# Patient Record
Sex: Male | Born: 1945
Health system: Southern US, Community
[De-identification: ages and names within clinical notes are randomized; demographics above are authoritative.]

## PROBLEM LIST (undated history)

## (undated) DIAGNOSIS — Z789 Other specified health status: Secondary | ICD-10-CM

## (undated) DIAGNOSIS — E119 Type 2 diabetes mellitus without complications: Secondary | ICD-10-CM

## (undated) DIAGNOSIS — I2510 Atherosclerotic heart disease of native coronary artery without angina pectoris: Secondary | ICD-10-CM

## (undated) DIAGNOSIS — IMO0001 Reserved for inherently not codable concepts without codable children: Secondary | ICD-10-CM

## (undated) DIAGNOSIS — I10 Essential (primary) hypertension: Secondary | ICD-10-CM

## (undated) DIAGNOSIS — N289 Disorder of kidney and ureter, unspecified: Secondary | ICD-10-CM

## (undated) DIAGNOSIS — G4730 Sleep apnea, unspecified: Secondary | ICD-10-CM

## (undated) HISTORY — DX: Type 2 diabetes mellitus without complications: E11.9

## (undated) HISTORY — DX: Disorder of kidney and ureter, unspecified: N28.9

## (undated) HISTORY — DX: Other specified health status: Z78.9

## (undated) HISTORY — PX: COLONOSCOPY: SHX174

## (undated) HISTORY — PX: CARDIAC CATHETERIZATION: SHX172

## (undated) HISTORY — DX: Atherosclerotic heart disease of native coronary artery without angina pectoris: I25.10

## (undated) HISTORY — DX: Reserved for inherently not codable concepts without codable children: IMO0001

## (undated) HISTORY — PX: BACK SURGERY: SHX140

---

## 2007-01-29 ENCOUNTER — Encounter: Admission: RE | Admit: 2007-01-29 | Discharge: 2007-01-29 | Payer: Self-pay | Admitting: Nephrology

## 2008-03-18 ENCOUNTER — Encounter: Admission: RE | Admit: 2008-03-18 | Discharge: 2008-03-18 | Payer: Self-pay | Admitting: Nephrology

## 2010-09-27 ENCOUNTER — Ambulatory Visit
Admission: RE | Admit: 2010-09-27 | Discharge: 2010-09-27 | Disposition: A | Discharge status: Home / Self Care | Payer: 59 | Source: Ambulatory Visit | Attending: Nephrology | Admitting: Nephrology

## 2010-09-27 ENCOUNTER — Other Ambulatory Visit: Payer: Self-pay | Admitting: Nephrology

## 2010-09-27 DIAGNOSIS — R52 Pain, unspecified: Secondary | ICD-10-CM

## 2014-06-17 ENCOUNTER — Other Ambulatory Visit: Payer: Self-pay | Admitting: Internal Medicine

## 2014-06-17 DIAGNOSIS — R109 Unspecified abdominal pain: Secondary | ICD-10-CM

## 2014-06-25 ENCOUNTER — Ambulatory Visit (HOSPITAL_COMMUNITY)
Admission: RE | Admit: 2014-06-25 | Discharge: 2014-06-25 | Disposition: A | Discharge status: Home / Self Care | Payer: 59 | Source: Ambulatory Visit | Attending: Diagnostic Radiology | Admitting: Diagnostic Radiology

## 2014-06-25 DIAGNOSIS — N281 Cyst of kidney, acquired: Secondary | ICD-10-CM | POA: Insufficient documentation

## 2014-06-25 DIAGNOSIS — R945 Abnormal results of liver function studies: Secondary | ICD-10-CM | POA: Diagnosis not present

## 2014-06-25 DIAGNOSIS — R109 Unspecified abdominal pain: Secondary | ICD-10-CM | POA: Diagnosis present

## 2014-06-30 ENCOUNTER — Other Ambulatory Visit (HOSPITAL_COMMUNITY): Payer: 59

## 2014-06-30 ENCOUNTER — Ambulatory Visit (HOSPITAL_COMMUNITY): Payer: 59

## 2014-08-13 ENCOUNTER — Ambulatory Visit (HOSPITAL_COMMUNITY)
Admission: RE | Admit: 2014-08-13 | Discharge: 2014-08-13 | Disposition: A | Discharge status: Home / Self Care | Payer: 59 | Source: Ambulatory Visit | Attending: Internal Medicine | Admitting: Internal Medicine

## 2014-08-13 ENCOUNTER — Encounter (INDEPENDENT_AMBULATORY_CARE_PROVIDER_SITE_OTHER): Payer: Self-pay

## 2014-08-13 ENCOUNTER — Other Ambulatory Visit: Payer: Self-pay | Admitting: Internal Medicine

## 2014-08-13 DIAGNOSIS — R110 Nausea: Secondary | ICD-10-CM | POA: Diagnosis not present

## 2014-08-13 DIAGNOSIS — R140 Abdominal distension (gaseous): Secondary | ICD-10-CM | POA: Diagnosis present

## 2014-08-13 DIAGNOSIS — K5641 Fecal impaction: Secondary | ICD-10-CM

## 2014-12-01 ENCOUNTER — Other Ambulatory Visit: Payer: Self-pay | Admitting: Internal Medicine

## 2014-12-01 DIAGNOSIS — M5416 Radiculopathy, lumbar region: Secondary | ICD-10-CM

## 2014-12-03 ENCOUNTER — Ambulatory Visit (HOSPITAL_COMMUNITY): Admission: RE | Admit: 2014-12-03 | Payer: 59 | Source: Ambulatory Visit

## 2014-12-03 ENCOUNTER — Other Ambulatory Visit: Payer: Self-pay | Admitting: Internal Medicine

## 2014-12-03 ENCOUNTER — Ambulatory Visit (HOSPITAL_COMMUNITY)
Admission: RE | Admit: 2014-12-03 | Discharge: 2014-12-03 | Disposition: A | Discharge status: Home / Self Care | Payer: 59 | Source: Ambulatory Visit | Attending: Internal Medicine | Admitting: Internal Medicine

## 2014-12-03 DIAGNOSIS — M5416 Radiculopathy, lumbar region: Secondary | ICD-10-CM | POA: Diagnosis present

## 2014-12-24 ENCOUNTER — Other Ambulatory Visit: Payer: Self-pay | Admitting: Neurosurgery

## 2014-12-29 ENCOUNTER — Encounter (HOSPITAL_COMMUNITY)
Admission: RE | Admit: 2014-12-29 | Discharge: 2014-12-29 | Disposition: A | Discharge status: Home / Self Care | Payer: 59 | Source: Ambulatory Visit | Attending: Neurosurgery | Admitting: Neurosurgery

## 2014-12-29 ENCOUNTER — Encounter (HOSPITAL_COMMUNITY): Payer: Self-pay

## 2014-12-29 ENCOUNTER — Other Ambulatory Visit: Payer: Self-pay

## 2014-12-29 DIAGNOSIS — M5116 Intervertebral disc disorders with radiculopathy, lumbar region: Secondary | ICD-10-CM | POA: Diagnosis not present

## 2014-12-29 DIAGNOSIS — I10 Essential (primary) hypertension: Secondary | ICD-10-CM | POA: Diagnosis not present

## 2014-12-29 DIAGNOSIS — E119 Type 2 diabetes mellitus without complications: Secondary | ICD-10-CM | POA: Diagnosis not present

## 2014-12-29 DIAGNOSIS — M47816 Spondylosis without myelopathy or radiculopathy, lumbar region: Secondary | ICD-10-CM | POA: Diagnosis not present

## 2014-12-29 HISTORY — DX: Essential (primary) hypertension: I10

## 2014-12-29 HISTORY — DX: Type 2 diabetes mellitus without complications: E11.9

## 2014-12-29 LAB — BASIC METABOLIC PANEL
Anion gap: 9 (ref 5–15)
BUN: 9 mg/dL (ref 6–20)
CO2: 26 mmol/L (ref 22–32)
Calcium: 9.6 mg/dL (ref 8.9–10.3)
Chloride: 102 mmol/L (ref 101–111)
Creatinine, Ser: 1.38 mg/dL — ABNORMAL HIGH (ref 0.61–1.24)
GFR calc Af Amer: 60 mL/min — ABNORMAL LOW (ref >60)
GFR calc non Af Amer: 52 mL/min — ABNORMAL LOW (ref >60)
Glucose, Bld: 214 mg/dL — ABNORMAL HIGH (ref 65–99)
Potassium: 4.3 mmol/L (ref 3.5–5.1)
Sodium: 137 mmol/L (ref 135–145)

## 2014-12-29 LAB — CBC
HCT: 45.9 % (ref 39.0–52.0)
Hemoglobin: 14.7 g/dL (ref 13.0–17.0)
MCH: 29.5 pg (ref 26.0–34.0)
MCHC: 32 g/dL (ref 30.0–36.0)
MCV: 92.2 fL (ref 78.0–100.0)
Platelets: 253 10*3/uL (ref 150–400)
RBC: 4.98 MIL/uL (ref 4.22–5.81)
RDW: 12.4 % (ref 11.5–15.5)
WBC: 10.1 10*3/uL (ref 4.0–10.5)

## 2014-12-29 LAB — GLUCOSE, CAPILLARY: Glucose-Capillary: 240 mg/dL — ABNORMAL HIGH (ref 65–99)

## 2014-12-29 LAB — SURGICAL PCR SCREEN
MRSA, PCR: NEGATIVE
Staphylococcus aureus: NEGATIVE

## 2014-12-29 NOTE — Progress Notes (Signed)
This patient scored at an elevated risk for obstructive sleep apnea using the STOP BANG TOOL during a pre surgical testing 

## 2014-12-29 NOTE — Pre-Procedure Instructions (Addendum)
Dvaughn L Ederer  12/29/2014   Your procedure is scheduled on:  Jan 01, 2015  Report to [facility] Admitting at 8:15 AM.  Call this number if you have problems the morning of surgery: [phone]   Remember:   Do not eat food or drink liquids after midnight.   Take these medicines the morning of surgery with A SIP OF WATER: allopurinol (ZYLOPRIM),colchicine,     STOP ASPIRIN, FISH OIL, HERBAL SUPPLEMENTS, VITAMIN E, NSAIDS (NAPROXEN, ADVIL, IBUPROFEN, ALEVE) 7 DAYS PRIOR TO SURGERY   Do not wear jewelry.  Do not wear lotions, powders, or colognes. You may wear deodorant.   Men may shave face and neck.  Do not bring valuables to the hospital.  [facility] is not responsible  for any belongings or valuables.               Contacts, dentures or bridgework may not be worn into surgery.  Leave suitcase in the car. After surgery it may be brought to your room.  For patients admitted to the hospital, discharge time is determined by your  treatment team.               Patients discharged the day of surgery will not be allowed to drive home.  Name and phone number of your driver: family/friend  Special Instructions: "PREPARING FOR SURGERY"   Please read over the following fact sheets that you were given: Pain Booklet, Coughing and Deep Breathing and Surgical Site Infection Prevention

## 2014-12-30 LAB — NO BLOOD PRODUCTS

## 2014-12-30 LAB — HEMOGLOBIN A1C
Hgb A1c MFr Bld: 6.6 % — ABNORMAL HIGH (ref 4.8–5.6)
Mean Plasma Glucose: 143 mg/dL

## 2014-12-30 NOTE — Progress Notes (Signed)
Anesthesia Chart Review:  Pt is 69 year old male scheduled for L5-S1 lumbar laminectomy/decompression microdiscectomy on 01/01/2015 with Dr. Sherwood Gambler.   PMH includes: HTN. DM. Smoking history not documented. BMI 37  Medications include: ASA, enalapril, glimepiride, hctz, janumet.   Preoperative labs reviewed.  Glucose 214, HgbA1c 6.6.   EKG: NSR. Possible Left atrial enlargement. Left axis deviation. Right bundle branch block. No old tracing for comparison.   Reviewed EKG with Dr. Therisa Doyne.   If no changes, I anticipate pt can proceed with surgery as scheduled.   Willeen Cass, FNP-BC [facility]/Anesthesiology Phone: [phone] 12/30/2014 3:39 PM

## 2014-12-31 MED ORDER — CEFAZOLIN SODIUM-DEXTROSE 2-3 GM-% IV SOLR
2.0000 g | INTRAVENOUS | Status: AC
  Administered 2015-01-01: 2 g via INTRAVENOUS
  Filled 2014-12-31: qty 50

## 2015-01-01 ENCOUNTER — Encounter (HOSPITAL_COMMUNITY): Payer: Self-pay | Admitting: General Practice

## 2015-01-01 ENCOUNTER — Encounter (HOSPITAL_COMMUNITY)
Admission: RE | Disposition: A | Discharge status: Home / Self Care | Payer: Self-pay | Source: Ambulatory Visit | Attending: Neurosurgery

## 2015-01-01 ENCOUNTER — Observation Stay (HOSPITAL_COMMUNITY)
Admission: RE | Admit: 2015-01-01 | Discharge: 2015-01-02 | Disposition: A | Discharge status: Home / Self Care | Payer: 59 | Source: Ambulatory Visit | Attending: Neurosurgery | Admitting: Neurosurgery

## 2015-01-01 ENCOUNTER — Ambulatory Visit (HOSPITAL_COMMUNITY): Payer: 59 | Admitting: Anesthesiology

## 2015-01-01 ENCOUNTER — Ambulatory Visit (HOSPITAL_COMMUNITY): Payer: 59 | Admitting: Emergency Medicine

## 2015-01-01 ENCOUNTER — Ambulatory Visit (HOSPITAL_COMMUNITY): Payer: 59

## 2015-01-01 DIAGNOSIS — Z419 Encounter for procedure for purposes other than remedying health state, unspecified: Secondary | ICD-10-CM

## 2015-01-01 DIAGNOSIS — M5116 Intervertebral disc disorders with radiculopathy, lumbar region: Secondary | ICD-10-CM | POA: Diagnosis not present

## 2015-01-01 DIAGNOSIS — M5126 Other intervertebral disc displacement, lumbar region: Secondary | ICD-10-CM | POA: Diagnosis present

## 2015-01-01 DIAGNOSIS — E119 Type 2 diabetes mellitus without complications: Secondary | ICD-10-CM | POA: Insufficient documentation

## 2015-01-01 DIAGNOSIS — I10 Essential (primary) hypertension: Secondary | ICD-10-CM | POA: Insufficient documentation

## 2015-01-01 DIAGNOSIS — M47816 Spondylosis without myelopathy or radiculopathy, lumbar region: Secondary | ICD-10-CM | POA: Insufficient documentation

## 2015-01-01 HISTORY — PX: LUMBAR LAMINECTOMY/DECOMPRESSION MICRODISCECTOMY: SHX5026

## 2015-01-01 LAB — GLUCOSE, CAPILLARY
Glucose-Capillary: 179 mg/dL — ABNORMAL HIGH (ref 65–99)
Glucose-Capillary: 213 mg/dL — ABNORMAL HIGH (ref 65–99)
Glucose-Capillary: 346 mg/dL — ABNORMAL HIGH (ref 65–99)
Glucose-Capillary: 357 mg/dL — ABNORMAL HIGH (ref 65–99)

## 2015-01-01 SURGERY — LUMBAR LAMINECTOMY/DECOMPRESSION MICRODISCECTOMY 1 LEVEL
Anesthesia: General | Laterality: Right

## 2015-01-01 MED ORDER — ARTIFICIAL TEARS OP OINT
TOPICAL_OINTMENT | OPHTHALMIC | Status: DC | PRN
  Administered 2015-01-01: 1 via OPHTHALMIC

## 2015-01-01 MED ORDER — ONDANSETRON HCL 4 MG PO TABS: 4.0000 mg | ORAL_TABLET | Freq: Four times a day (QID) | ORAL | Status: DC | PRN

## 2015-01-01 MED ORDER — SODIUM CHLORIDE 0.9 % IJ SOLN: 3.0000 mL | INTRAMUSCULAR | Status: DC | PRN

## 2015-01-01 MED ORDER — DEXAMETHASONE SODIUM PHOSPHATE 4 MG/ML IJ SOLN
INTRAMUSCULAR | Status: AC
  Filled 2015-01-01: qty 2

## 2015-01-01 MED ORDER — BISACODYL 10 MG RE SUPP
10.0000 mg | Freq: Every day | RECTAL | Status: DC | PRN
Start: 2015-01-01 — End: 2015-01-02

## 2015-01-01 MED ORDER — FENTANYL CITRATE (PF) 250 MCG/5ML IJ SOLN
INTRAMUSCULAR | Status: AC
  Filled 2015-01-01: qty 5

## 2015-01-01 MED ORDER — MORPHINE SULFATE 4 MG/ML IJ SOLN: 4.0000 mg | INTRAMUSCULAR | Status: DC | PRN

## 2015-01-01 MED ORDER — ALUM & MAG HYDROXIDE-SIMETH 200-200-20 MG/5ML PO SUSP: 30.0000 mL | Freq: Four times a day (QID) | ORAL | Status: DC | PRN

## 2015-01-01 MED ORDER — NEOSTIGMINE METHYLSULFATE 10 MG/10ML IV SOLN
INTRAVENOUS | Status: DC | PRN
  Administered 2015-01-01: 3 mg via INTRAVENOUS

## 2015-01-01 MED ORDER — MAGNESIUM HYDROXIDE 400 MG/5ML PO SUSP: 30.0000 mL | Freq: Every day | ORAL | Status: DC | PRN

## 2015-01-01 MED ORDER — METHYLPREDNISOLONE ACETATE 40 MG/ML IJ SUSP
INTRAMUSCULAR | Status: DC | PRN
  Administered 2015-01-01: 80 mg

## 2015-01-01 MED ORDER — SODIUM CHLORIDE 0.9 % IJ SOLN
3.0000 mL | Freq: Two times a day (BID) | INTRAMUSCULAR | Status: DC
  Administered 2015-01-01 (×2): 3 mL via INTRAVENOUS

## 2015-01-01 MED ORDER — ROCURONIUM BROMIDE 50 MG/5ML IV SOLN
INTRAVENOUS | Status: AC
  Filled 2015-01-01: qty 1

## 2015-01-01 MED ORDER — LINAGLIPTIN 5 MG PO TABS
5.0000 mg | ORAL_TABLET | Freq: Every day | ORAL | Status: DC
  Administered 2015-01-02: 5 mg via ORAL
  Filled 2015-01-01 (×2): qty 1

## 2015-01-01 MED ORDER — PHENYLEPHRINE 40 MCG/ML (10ML) SYRINGE FOR IV PUSH (FOR BLOOD PRESSURE SUPPORT)
PREFILLED_SYRINGE | INTRAVENOUS | Status: AC
  Filled 2015-01-01: qty 10

## 2015-01-01 MED ORDER — ACETAMINOPHEN 10 MG/ML IV SOLN
INTRAVENOUS | Status: DC | PRN
  Administered 2015-01-01: 1000 mg via INTRAVENOUS

## 2015-01-01 MED ORDER — BUPIVACAINE HCL (PF) 0.5 % IJ SOLN
INTRAMUSCULAR | Status: DC | PRN
  Administered 2015-01-01: 20 mL

## 2015-01-01 MED ORDER — SODIUM CHLORIDE 0.9 % IR SOLN
Status: DC | PRN
  Administered 2015-01-01: 10:00:00

## 2015-01-01 MED ORDER — LABETALOL HCL 5 MG/ML IV SOLN
INTRAVENOUS | Status: DC | PRN
  Administered 2015-01-01 (×4): 5 mg via INTRAVENOUS

## 2015-01-01 MED ORDER — ROCURONIUM BROMIDE 100 MG/10ML IV SOLN
INTRAVENOUS | Status: DC | PRN
  Administered 2015-01-01: 10 mg via INTRAVENOUS
  Administered 2015-01-01: 40 mg via INTRAVENOUS

## 2015-01-01 MED ORDER — INSULIN ASPART 100 UNIT/ML ~~LOC~~ SOLN
0.0000 [IU] | Freq: Every day | SUBCUTANEOUS | Status: DC
  Administered 2015-01-01: 5 [IU] via SUBCUTANEOUS

## 2015-01-01 MED ORDER — LACTATED RINGERS IV SOLN
INTRAVENOUS | Status: DC | PRN
  Administered 2015-01-01 (×2): via INTRAVENOUS

## 2015-01-01 MED ORDER — ONDANSETRON HCL 4 MG/2ML IJ SOLN
INTRAMUSCULAR | Status: AC
  Filled 2015-01-01: qty 2

## 2015-01-01 MED ORDER — MENTHOL 3 MG MT LOZG: 1.0000 | LOZENGE | OROMUCOSAL | Status: DC | PRN

## 2015-01-01 MED ORDER — DEXAMETHASONE SODIUM PHOSPHATE 4 MG/ML IJ SOLN
INTRAMUSCULAR | Status: DC | PRN
  Administered 2015-01-01: 8 mg via INTRAVENOUS

## 2015-01-01 MED ORDER — METFORMIN HCL 500 MG PO TABS
500.0000 mg | ORAL_TABLET | Freq: Two times a day (BID) | ORAL | Status: DC
  Administered 2015-01-01 – 2015-01-02 (×2): 500 mg via ORAL
  Filled 2015-01-01 (×4): qty 1

## 2015-01-01 MED ORDER — ONDANSETRON HCL 4 MG/2ML IJ SOLN: 4.0000 mg | Freq: Four times a day (QID) | INTRAMUSCULAR | Status: DC | PRN

## 2015-01-01 MED ORDER — HYDROXYZINE HCL 25 MG PO TABS: 50.0000 mg | ORAL_TABLET | ORAL | Status: DC | PRN

## 2015-01-01 MED ORDER — OMEGA-3-ACID ETHYL ESTERS 1 G PO CAPS
1.0000 g | ORAL_CAPSULE | Freq: Three times a day (TID) | ORAL | Status: DC
  Administered 2015-01-01 – 2015-01-02 (×2): 1 g via ORAL
  Filled 2015-01-01 (×4): qty 1

## 2015-01-01 MED ORDER — 0.9 % SODIUM CHLORIDE (POUR BTL) OPTIME
TOPICAL | Status: DC | PRN
  Administered 2015-01-01: 1000 mL

## 2015-01-01 MED ORDER — ACETAMINOPHEN 10 MG/ML IV SOLN
INTRAVENOUS | Status: AC
  Filled 2015-01-01: qty 100

## 2015-01-01 MED ORDER — INSULIN ASPART 100 UNIT/ML ~~LOC~~ SOLN
0.0000 [IU] | Freq: Three times a day (TID) | SUBCUTANEOUS | Status: DC
  Administered 2015-01-02: 7 [IU] via SUBCUTANEOUS

## 2015-01-01 MED ORDER — ENALAPRIL MALEATE 5 MG PO TABS
5.0000 mg | ORAL_TABLET | Freq: Every day | ORAL | Status: DC
  Administered 2015-01-01 – 2015-01-02 (×2): 5 mg via ORAL
  Filled 2015-01-01 (×2): qty 1

## 2015-01-01 MED ORDER — HYDROCODONE-ACETAMINOPHEN 5-325 MG PO TABS
1.0000 | ORAL_TABLET | ORAL | Status: DC | PRN
  Administered 2015-01-01 (×2): 2 via ORAL
  Filled 2015-01-01 (×2): qty 2

## 2015-01-01 MED ORDER — PROPOFOL 10 MG/ML IV BOLUS
INTRAVENOUS | Status: DC | PRN
  Administered 2015-01-01: 160 mg via INTRAVENOUS

## 2015-01-01 MED ORDER — FENTANYL CITRATE (PF) 100 MCG/2ML IJ SOLN
INTRAMUSCULAR | Status: DC | PRN
  Administered 2015-01-01 (×5): 50 ug via INTRAVENOUS
  Administered 2015-01-01: 100 ug via INTRAVENOUS

## 2015-01-01 MED ORDER — GLYCOPYRROLATE 0.2 MG/ML IJ SOLN
INTRAMUSCULAR | Status: DC | PRN
  Administered 2015-01-01: 0.4 mg via INTRAVENOUS

## 2015-01-01 MED ORDER — LIDOCAINE HCL (CARDIAC) 20 MG/ML IV SOLN
INTRAVENOUS | Status: AC
  Filled 2015-01-01: qty 5

## 2015-01-01 MED ORDER — KETOROLAC TROMETHAMINE 15 MG/ML IJ SOLN
INTRAMUSCULAR | Status: AC
  Administered 2015-01-01: 15 mg
  Filled 2015-01-01: qty 1

## 2015-01-01 MED ORDER — ACETAMINOPHEN 650 MG RE SUPP: 650.0000 mg | RECTAL | Status: DC | PRN

## 2015-01-01 MED ORDER — MIDAZOLAM HCL 5 MG/5ML IJ SOLN
INTRAMUSCULAR | Status: DC | PRN
  Administered 2015-01-01: 2 mg via INTRAVENOUS

## 2015-01-01 MED ORDER — KETOROLAC TROMETHAMINE 30 MG/ML IJ SOLN
15.0000 mg | Freq: Four times a day (QID) | INTRAMUSCULAR | Status: DC
  Administered 2015-01-01 (×2): 15 mg via INTRAVENOUS
  Filled 2015-01-01: qty 1

## 2015-01-01 MED ORDER — HYDROCHLOROTHIAZIDE 25 MG PO TABS
25.0000 mg | ORAL_TABLET | Freq: Every day | ORAL | Status: DC
  Administered 2015-01-01 – 2015-01-02 (×2): 25 mg via ORAL
  Filled 2015-01-01 (×2): qty 1

## 2015-01-01 MED ORDER — PROPOFOL 10 MG/ML IV BOLUS
INTRAVENOUS | Status: AC
  Filled 2015-01-01: qty 20

## 2015-01-01 MED ORDER — COLCHICINE 0.6 MG PO TABS
0.6000 mg | ORAL_TABLET | Freq: Every day | ORAL | Status: DC
  Administered 2015-01-01 – 2015-01-02 (×2): 0.6 mg via ORAL
  Filled 2015-01-01 (×2): qty 1

## 2015-01-01 MED ORDER — MEPERIDINE HCL 25 MG/ML IJ SOLN: 6.2500 mg | INTRAMUSCULAR | Status: DC | PRN

## 2015-01-01 MED ORDER — MIDAZOLAM HCL 2 MG/2ML IJ SOLN
INTRAMUSCULAR | Status: AC
  Filled 2015-01-01: qty 2

## 2015-01-01 MED ORDER — ALLOPURINOL 100 MG PO TABS
100.0000 mg | ORAL_TABLET | Freq: Every day | ORAL | Status: DC
  Administered 2015-01-01 – 2015-01-02 (×2): 100 mg via ORAL
  Filled 2015-01-01 (×2): qty 1

## 2015-01-01 MED ORDER — ACETAMINOPHEN 325 MG PO TABS: 650.0000 mg | ORAL_TABLET | ORAL | Status: DC | PRN

## 2015-01-01 MED ORDER — HYDROMORPHONE HCL 1 MG/ML IJ SOLN: 0.2500 mg | INTRAMUSCULAR | Status: DC | PRN

## 2015-01-01 MED ORDER — HYDROXYZINE HCL 50 MG/ML IM SOLN
50.0000 mg | INTRAMUSCULAR | Status: DC | PRN
Start: 2015-01-01 — End: 2015-01-02
  Filled 2015-01-01: qty 1

## 2015-01-01 MED ORDER — LIDOCAINE-EPINEPHRINE 1 %-1:100000 IJ SOLN
INTRAMUSCULAR | Status: DC | PRN
  Administered 2015-01-01: 20 mL

## 2015-01-01 MED ORDER — KETOROLAC TROMETHAMINE 30 MG/ML IJ SOLN: 15.0000 mg | Freq: Once | INTRAMUSCULAR | Status: DC

## 2015-01-01 MED ORDER — LACTATED RINGERS IV SOLN
INTRAVENOUS | Status: DC
  Administered 2015-01-01: 09:00:00 via INTRAVENOUS

## 2015-01-01 MED ORDER — PHENOL 1.4 % MT LIQD: 1.0000 | OROMUCOSAL | Status: DC | PRN

## 2015-01-01 MED ORDER — SITAGLIPTIN PHOS-METFORMIN HCL 50-1000 MG PO TABS: 1.0000 | ORAL_TABLET | Freq: Two times a day (BID) | ORAL | Status: DC

## 2015-01-01 MED ORDER — POTASSIUM CHLORIDE IN NACL 20-0.9 MEQ/L-% IV SOLN
INTRAVENOUS | Status: DC
  Filled 2015-01-01 (×4): qty 1000

## 2015-01-01 MED ORDER — GLIMEPIRIDE 4 MG PO TABS
4.0000 mg | ORAL_TABLET | Freq: Two times a day (BID) | ORAL | Status: DC
  Administered 2015-01-01 – 2015-01-02 (×2): 4 mg via ORAL
  Filled 2015-01-01 (×4): qty 1

## 2015-01-01 MED ORDER — ONDANSETRON HCL 4 MG/2ML IJ SOLN
INTRAMUSCULAR | Status: DC | PRN
  Administered 2015-01-01: 4 mg via INTRAVENOUS

## 2015-01-01 MED ORDER — OXYCODONE-ACETAMINOPHEN 5-325 MG PO TABS: 1.0000 | ORAL_TABLET | ORAL | Status: DC | PRN

## 2015-01-01 MED ORDER — CYCLOBENZAPRINE HCL 10 MG PO TABS
10.0000 mg | ORAL_TABLET | Freq: Three times a day (TID) | ORAL | Status: DC | PRN
  Administered 2015-01-01: 10 mg via ORAL
  Filled 2015-01-01: qty 1

## 2015-01-01 MED ORDER — LIDOCAINE HCL (CARDIAC) 20 MG/ML IV SOLN
INTRAVENOUS | Status: DC | PRN
  Administered 2015-01-01: 50 mg via INTRAVENOUS

## 2015-01-01 MED ORDER — FENTANYL CITRATE (PF) 100 MCG/2ML IJ SOLN
INTRAMUSCULAR | Status: AC
  Filled 2015-01-01: qty 2

## 2015-01-01 MED ORDER — THROMBIN 5000 UNITS EX SOLR
CUTANEOUS | Status: DC | PRN
  Administered 2015-01-01 (×2): 5000 [IU] via TOPICAL

## 2015-01-01 MED ORDER — ACETAMINOPHEN 10 MG/ML IV SOLN: 1000.0000 mg | Freq: Once | INTRAVENOUS | Status: DC | PRN

## 2015-01-01 MED ORDER — PROMETHAZINE HCL 25 MG/ML IJ SOLN: 6.2500 mg | INTRAMUSCULAR | Status: DC | PRN

## 2015-01-01 MED ORDER — KETOROLAC TROMETHAMINE 15 MG/ML IJ SOLN
15.0000 mg | Freq: Four times a day (QID) | INTRAMUSCULAR | Status: DC
  Administered 2015-01-02: 15 mg via INTRAVENOUS
  Filled 2015-01-01 (×5): qty 1

## 2015-01-01 MED ORDER — HEMOSTATIC AGENTS (NO CHARGE) OPTIME
TOPICAL | Status: DC | PRN
  Administered 2015-01-01: 1 via TOPICAL

## 2015-01-01 SURGICAL SUPPLY — 61 items
ADH SKN CLS APL DERMABOND .7 (GAUZE/BANDAGES/DRESSINGS) ×1
APL SKNCLS STERI-STRIP NONHPOA (GAUZE/BANDAGES/DRESSINGS)
BAG DECANTER FOR FLEXI CONT (MISCELLANEOUS) ×2 IMPLANT
BENZOIN TINCTURE PRP APPL 2/3 (GAUZE/BANDAGES/DRESSINGS) IMPLANT
BLADE CLIPPER SURG (BLADE) IMPLANT
BRUSH SCRUB EZ PLAIN DRY (MISCELLANEOUS) ×2 IMPLANT
BUR ACORN 6.0 ACORN (BURR) IMPLANT
BUR ACRON 5.0MM COATED (BURR) IMPLANT
BUR MATCHSTICK NEURO 3.0 LAGG (BURR) ×2 IMPLANT
CANISTER SUCT 3000ML PPV (MISCELLANEOUS) ×2 IMPLANT
CONT SPEC 4OZ CLIKSEAL STRL BL (MISCELLANEOUS) IMPLANT
DERMABOND ADVANCED (GAUZE/BANDAGES/DRESSINGS) ×1
DERMABOND ADVANCED .7 DNX12 (GAUZE/BANDAGES/DRESSINGS) IMPLANT
DRAPE LAPAROTOMY 100X72X124 (DRAPES) ×2 IMPLANT
DRAPE MICROSCOPE LEICA (MISCELLANEOUS) ×2 IMPLANT
DRAPE POUCH INSTRU U-SHP 10X18 (DRAPES) ×2 IMPLANT
DRSG EMULSION OIL 3X3 NADH (GAUZE/BANDAGES/DRESSINGS) IMPLANT
ELECT REM PT RETURN 9FT ADLT (ELECTROSURGICAL) ×2
ELECTRODE REM PT RTRN 9FT ADLT (ELECTROSURGICAL) ×1 IMPLANT
GAUZE SPONGE 4X4 12PLY STRL (GAUZE/BANDAGES/DRESSINGS) IMPLANT
GAUZE SPONGE 4X4 16PLY XRAY LF (GAUZE/BANDAGES/DRESSINGS) IMPLANT
GLOVE BIOGEL PI IND STRL 8 (GLOVE) ×1 IMPLANT
GLOVE BIOGEL PI INDICATOR 8 (GLOVE) ×1
GLOVE ECLIPSE 7.5 STRL STRAW (GLOVE) ×2 IMPLANT
GLOVE EXAM NITRILE LRG STRL (GLOVE) IMPLANT
GLOVE EXAM NITRILE MD LF STRL (GLOVE) IMPLANT
GLOVE EXAM NITRILE XL STR (GLOVE) IMPLANT
GLOVE EXAM NITRILE XS STR PU (GLOVE) IMPLANT
GLOVE INDICATOR 7.5 STRL GRN (GLOVE) ×3 IMPLANT
GLOVE SURG SS PI 7.0 STRL IVOR (GLOVE) ×2 IMPLANT
GOWN STRL REUS W/ TWL LRG LVL3 (GOWN DISPOSABLE) ×1 IMPLANT
GOWN STRL REUS W/ TWL XL LVL3 (GOWN DISPOSABLE) IMPLANT
GOWN STRL REUS W/TWL 2XL LVL3 (GOWN DISPOSABLE) IMPLANT
GOWN STRL REUS W/TWL LRG LVL3 (GOWN DISPOSABLE) ×2
GOWN STRL REUS W/TWL XL LVL3 (GOWN DISPOSABLE) ×4
KIT BASIN OR (CUSTOM PROCEDURE TRAY) ×2 IMPLANT
KIT ROOM TURNOVER OR (KITS) ×2 IMPLANT
NDL HYPO 18GX1.5 BLUNT FILL (NEEDLE) IMPLANT
NDL SPNL 18GX3.5 QUINCKE PK (NEEDLE) ×1 IMPLANT
NDL SPNL 22GX3.5 QUINCKE BK (NEEDLE) ×1 IMPLANT
NEEDLE HYPO 18GX1.5 BLUNT FILL (NEEDLE) IMPLANT
NEEDLE SPNL 18GX3.5 QUINCKE PK (NEEDLE) ×2 IMPLANT
NEEDLE SPNL 22GX3.5 QUINCKE BK (NEEDLE) ×2 IMPLANT
NS IRRIG 1000ML POUR BTL (IV SOLUTION) ×2 IMPLANT
PACK LAMINECTOMY NEURO (CUSTOM PROCEDURE TRAY) ×2 IMPLANT
PAD ARMBOARD 7.5X6 YLW CONV (MISCELLANEOUS) ×6 IMPLANT
PATTIES SURGICAL .5 X1 (DISPOSABLE) ×2 IMPLANT
RUBBERBAND STERILE (MISCELLANEOUS) ×4 IMPLANT
SPONGE LAP 4X18 X RAY DECT (DISPOSABLE) IMPLANT
SPONGE SURGIFOAM ABS GEL SZ50 (HEMOSTASIS) ×2 IMPLANT
STRIP CLOSURE SKIN 1/2X4 (GAUZE/BANDAGES/DRESSINGS) IMPLANT
SUT PROLENE 6 0 BV (SUTURE) IMPLANT
SUT VIC AB 1 CT1 18XBRD ANBCTR (SUTURE) ×1 IMPLANT
SUT VIC AB 1 CT1 8-18 (SUTURE) ×4
SUT VIC AB 2-0 CP2 18 (SUTURE) ×3 IMPLANT
SUT VIC AB 3-0 SH 8-18 (SUTURE) ×1 IMPLANT
SYR 20ML ECCENTRIC (SYRINGE) ×2 IMPLANT
SYR 5ML LL (SYRINGE) ×1 IMPLANT
TOWEL OR 17X24 6PK STRL BLUE (TOWEL DISPOSABLE) ×2 IMPLANT
TOWEL OR 17X26 10 PK STRL BLUE (TOWEL DISPOSABLE) ×2 IMPLANT
WATER STERILE IRR 1000ML POUR (IV SOLUTION) ×2 IMPLANT

## 2015-01-01 NOTE — Plan of Care (Signed)
Problem: Consults Goal: Diagnosis - Spinal Surgery Outcome: Completed/Met Date Met:  01/01/15 Lumbar Laminectomy (Complex)

## 2015-01-01 NOTE — H&P (Signed)
Subjective: Patient is a 69 y.o. right-handed black male who is admitted for treatment of right L5-S1 neural foraminal disc herniation.  Patient has been suffering with low back and right lumbar radicular pain, running through the buttock, thigh, leg, and into the right foot. Symptoms have persisted despite nonsurgical management. Patient is admitted now for a right L5-S1 lumbar laminotomy and microdiscectomy.    Past Medical History  Diagnosis Date  . Diabetes mellitus without complication   . Hypertension     Past Surgical History  Procedure Laterality Date  . Colonoscopy      No prescriptions prior to admission   No Known Allergies  History  Substance Use Topics  . Smoking status: Not on file  . Smokeless tobacco: Not on file  . Alcohol Use: Not on file    Family history: Parents passed on. Mother had gallbladder disease. Father had Brown lung disease.  Review of Systems A comprehensive review of systems was negative.  EXAM: Patient is a well-developed well-nourished black male in no acute distress. Lungs are clear to auscultation , the patient has symmetrical respiratory excursion. Heart has a regular rate and rhythm normal S1 and S2 no murmur.   Abdomen is soft nontender nondistended bowel sounds are present. Extremity examination shows no clubbing cyanosis or edema. Motor examination shows 5 over 5 strength in the lower extremities including the iliopsoas quadriceps dorsiflexor extensor hallicus  longus and plantar flexor bilaterally. Sensation is intact to pinprick in the distal lower extremities. Reflexes are symmetrical bilaterally. No pathologic reflexes are present. Patient has a normal gait and stance.   Data Review:CBC    Component Value Date/Time   WBC 10.1 12/29/2014 1617   RBC 4.98 12/29/2014 1617   HGB 14.7 12/29/2014 1617   HCT 45.9 12/29/2014 1617   PLT 253 12/29/2014 1617   MCV 92.2 12/29/2014 1617   MCH 29.5 12/29/2014 1617   MCHC 32.0 12/29/2014 1617   RDW 12.4 12/29/2014 1617                          BMET    Component Value Date/Time   NA 137 12/29/2014 1617   K 4.3 12/29/2014 1617   CL 102 12/29/2014 1617   CO2 26 12/29/2014 1617   GLUCOSE 214* 12/29/2014 1617   BUN 9 12/29/2014 1617   CREATININE 1.38* 12/29/2014 1617   CALCIUM 9.6 12/29/2014 1617   GFRNONAA 52* 12/29/2014 1617   GFRAA 60* 12/29/2014 1617     Assessment/Plan: Patient with a right lumbar radiculopathy secondary to a right L5-S1 neural foraminal lumbar disc herniation, who is admitted now for right L5-S1 lumbar laminotomy and microdiscectomy.  I've discussed with the patient the nature of his condition, the nature the surgical procedure, the typical length of surgery, hospital stay, and overall recuperation. We discussed limitations postoperatively. I discussed risks of surgery including risks of infection, bleeding, possibly need for transfusion, the risk of nerve root dysfunction with pain, weakness, numbness, or paresthesias, or risk of dural tear and CSF leakage and possible need for further surgery, the risk of recurrent disc herniation and the possible need for further surgery, and the risk of anesthetic complications including myocardial infarction, stroke, pneumonia, and death. Understanding all this the patient does wish to proceed with surgery and is admitted for such.    Hosie Spangle, MD 01/01/2015 7:26 AM

## 2015-01-01 NOTE — Anesthesia Postprocedure Evaluation (Signed)
Anesthesia Post Note  Patient: Juan Hatfield  Procedure(s) Performed: Procedure(s) (LRB): LUMBAR LAMINECTOMY/DECOMPRESSION MICRODISCECTOMY RIGHT LUMBAR FIVE SACRAL ONE (Right)  Anesthesia type: General  Patient location: PACU  Post pain: Pain level controlled  Post assessment: Post-op Vital signs reviewed  Last Vitals: BP 153/68 mmHg  Pulse 61  Temp(Src) 36.4 C (Oral)  Resp 12  Ht 5\' 6"  (1.676 m)  Wt 229 lb (103.874 kg)  BMI 36.98 kg/m2  SpO2 100%  Post vital signs: Reviewed  Level of consciousness: sedated  Complications: No apparent anesthesia complications

## 2015-01-01 NOTE — Progress Notes (Signed)
Filed Vitals:   01/01/15 1240 01/01/15 1255 01/01/15 1327 01/01/15 1623  BP: 144/78 153/68  125/61  Pulse: 68 61  75  Temp:   97.5 F (36.4 C) 98 F (36.7 C)  TempSrc:      Resp: 17 12  18   Height:      Weight:      SpO2: 100% 100%  100%    Patient sitting up, eating dinner. Has been up and ambulating in the halls. Has voided. Wound clean and dry, Dermabond intact. Significant improvement in radicular symptoms.  Plan: Continue to progress through postoperative recovery.  Hosie Spangle, MD 01/01/2015, 6:13 PM

## 2015-01-01 NOTE — Transfer of Care (Signed)
Immediate Anesthesia Transfer of Care Note  Patient: Juan Hatfield  Procedure(s) Performed: Procedure(s): LUMBAR LAMINECTOMY/DECOMPRESSION MICRODISCECTOMY RIGHT LUMBAR FIVE SACRAL ONE (Right)  Patient Location: PACU  Anesthesia Type:General  Level of Consciousness: awake, alert , oriented and patient cooperative  Airway & Oxygen Therapy: Patient Spontanous Breathing and Patient connected to nasal cannula oxygen  Post-op Assessment: Report given to RN and Post -op Vital signs reviewed and stable  Post vital signs: Reviewed and stable  Last Vitals:  Filed Vitals:   01/01/15 0807  BP: 185/86  Pulse: 83  Temp: 36.7 C  Resp: 20    Complications: No apparent anesthesia complications

## 2015-01-01 NOTE — Anesthesia Preprocedure Evaluation (Signed)
Anesthesia Evaluation  Patient identified by MRN, date of birth, ID band Patient awake    Reviewed: Allergy & Precautions, NPO status , Patient's Chart, lab work & pertinent test results  Airway Mallampati: II  TM Distance: >3 FB Neck ROM: Full    Dental no notable dental hx.    Pulmonary neg pulmonary ROS,  breath sounds clear to auscultation  Pulmonary exam normal       Cardiovascular hypertension, Pt. on medications Normal cardiovascular examRhythm:Regular Rate:Normal     Neuro/Psych negative neurological ROS  negative psych ROS   GI/Hepatic negative GI ROS, Neg liver ROS,   Endo/Other  diabetes, Type 2, Oral Hypoglycemic Agents  Renal/GU negative Renal ROS     Musculoskeletal negative musculoskeletal ROS (+)   Abdominal (+) + obese,   Peds  Hematology negative hematology ROS (+)   Anesthesia Other Findings   Reproductive/Obstetrics                             Anesthesia Physical Anesthesia Plan  ASA: II  Anesthesia Plan: General   Post-op Pain Management:    Induction: Intravenous  Airway Management Planned: Oral ETT  Additional Equipment: None  Intra-op Plan:   Post-operative Plan: Extubation in OR  Informed Consent: I have reviewed the patients History and Physical, chart, labs and discussed the procedure including the risks, benefits and alternatives for the proposed anesthesia with the patient or authorized representative who has indicated his/her understanding and acceptance.   Dental advisory given  Plan Discussed with: CRNA  Anesthesia Plan Comments: (Jehovah's witness. Will have cell saver available. Hgb ~ 15 g/dl)        Anesthesia Quick Evaluation

## 2015-01-01 NOTE — Op Note (Signed)
01/01/2015  12:06 PM  PATIENT:  Juan Hatfield  69 y.o. male  PRE-OPERATIVE DIAGNOSIS:  Right L5-S1 foraminal disc herniation, lumbar degenerative disease, lumbar spondylosis, lumbar radiculopathy  POST-OPERATIVE DIAGNOSIS:  Right L5-S1 foraminal disc herniation, lumbar degenerative disease, lumbar spondylosis, lumbar radiculopathy  PROCEDURE:  Procedure(s):  Right L5-S1 lumbar laminotomy and microdiscectomy, with micro-section, microsurgical technique, and the operative microscope  SURGEON:  Surgeon(s): Jovita Gamma, MD Karie Chimera, MD  ASSISTANTS: Karie Chimera, M.D.  ANESTHESIA:   general  EBL:  Total I/O In: 1000 [I.V.:1000] Out: 40 [Blood:40]  BLOOD ADMINISTERED:none  COUNT: Correct per nursing staff  DICTATION: Patient was brought to the operating room and placed under general endotracheal anesthesia. Patient was turned to prone position the lumbar region was prepped with Betadine soap and solution and draped in a sterile fashion. The midline was infiltrated with local anesthetic with epinephrine. A localizing x-ray was taken and the L5-S1 level was identified. Midline incision was made over the L5-S1 level and was carried down through the subcutaneous tissue to the lumbar fascia. The lumbar fascia was incised on the right side and the paraspinal muscles were dissected from the spinous processes and lamina in a subperiosteal fashion. Another x-ray was taken and the L5-S1 intralaminar space was identified. The operating microscope was draped and brought into the field provided additional magnification, illumination, and visualization. Laminotomy was performed using the high-speed drill and Kerrison punches. The ligamentum flavum was carefully resected. The underlying thecal sac and nerve root were identified. The exposure was extended laterally into the right L5-S1 neural foramen. We identified the exiting right L5 nerve root as well as the exiting right S1 nerve root. The disc  herniation was identified and the thecal sac and nerve root gently retracted medially. The disc herniation fragment was partially extruded, and was removed, and then we further open the hole in the annulus, and continued with a thorough discectomy using a variety of pituitary rongeurs and micro-curettes. All loose fragments of disc material removed from both the disc space and the epidural space, as well as the right L5-S1 neural foramen, and good decompression was achieved of the thecal sac, and the exiting right L5 and S1 nerve roots. Once the discectomy was completed and good decompression of the thecal sac and nerve had been achieved hemostasis was established with the use of bipolar cautery and Gelfoam with thrombin. The Gelfoam was removed and hemostasis confirmed. We then instilled 2 cc of fentanyl and 80 mg of Depo-Medrol into the epidural space. Deep fascia was closed with interrupted undyed 1 Vicryl sutures. Scarpa's fascia was closed with interrupted undyed 1 Vicryl sutures in the subcutaneous and subcuticular layer were closed with interrupted inverted 2-0 undyed Vicryl sutures. The skin edges were approximated with Dermabond. Following surgery the patient was turned back to a supine position to be reversed from the anesthetic extubated and transferred to the recovery room for further care.  PLAN OF CARE: Admit for overnight observation  PATIENT DISPOSITION:  PACU - hemodynamically stable.   Delay start of Pharmacological VTE agent (>24hrs) due to surgical blood loss or risk of bleeding:  yes

## 2015-01-01 NOTE — OR Nursing (Signed)
Fentanyl 149mcg/2ml given to operative site by [facility]

## 2015-01-02 DIAGNOSIS — M5116 Intervertebral disc disorders with radiculopathy, lumbar region: Secondary | ICD-10-CM | POA: Diagnosis not present

## 2015-01-02 LAB — GLUCOSE, CAPILLARY: Glucose-Capillary: 247 mg/dL — ABNORMAL HIGH (ref 65–99)

## 2015-01-02 MED ORDER — HYDROCODONE-ACETAMINOPHEN 5-325 MG PO TABS: 1.0000 | ORAL_TABLET | ORAL | Status: DC | PRN

## 2015-01-02 NOTE — Discharge Instructions (Signed)

## 2015-01-02 NOTE — Discharge Summary (Signed)
Physician Discharge Summary  Patient ID: Juan Hatfield MRN: [id number] DOB/AGE: 04-20-1946 69 y.o.  Admit date: 01/01/2015 Discharge date: 01/02/2015  Admission Diagnoses:  Right L5-S1 foraminal disc herniation, lumbar degenerative disease, lumbar spondylosis, lumbar radiculopathy  Discharge Diagnoses:  Right L5-S1 foraminal disc herniation, lumbar degenerative disease, lumbar spondylosis, lumbar radiculopathy Active Problems:   HNP (herniated nucleus pulposus), lumbar   Discharged Condition: good  Hospital Course: Patient was admitted, underwent a right L5-S1 lumbar laminotomy and microsurgical K. He is done well following surgery, with good relief of his radicular symptoms. His incision is healing nicely. He is up and ambulate actively in the halls. He is being discharged home with instructions regarding wound care and activities. He is to return for follow-up with me in 3 weeks.  Discharge Exam: Blood pressure 128/82, pulse 73, temperature 97.9 F (36.6 C), temperature source Oral, resp. rate 20, height 5\' 6"  (1.676 m), weight 103.874 kg (229 lb), SpO2 97 %.  Disposition:  home     Medication List    TAKE these medications        allopurinol 100 MG tablet  Commonly known as:  ZYLOPRIM  Take 100 mg by mouth daily.     aspirin 81 MG tablet  Take 81 mg by mouth daily.     Co Q 10 100 MG Caps  Take 1 tablet by mouth daily.     colchicine 0.6 MG tablet  Take 0.6 mg by mouth daily.     enalapril 5 MG tablet  Commonly known as:  VASOTEC  Take 5 mg by mouth daily.     glimepiride 4 MG tablet  Commonly known as:  AMARYL  Take 4 mg by mouth 2 (two) times daily.     hydrochlorothiazide 25 MG tablet  Commonly known as:  HYDRODIURIL  Take 25 mg by mouth daily.     HYDROcodone-acetaminophen 5-325 MG per tablet  Commonly known as:  NORCO/VICODIN  Take 1-2 tablets by mouth every 4 (four) hours as needed (mild pain).     MULTIVITAMIN ADULT PO  Take 1 tablet by mouth  daily.     SUPER ANTIOXIDANT PO  Take 1 tablet by mouth daily.     naproxen 375 MG tablet  Commonly known as:  NAPROSYN  Take 375 mg by mouth 2 (two) times daily with a meal.     omega-3 acid ethyl esters 1 G capsule  Commonly known as:  LOVAZA  Take 1 g by mouth 3 (three) times daily.     sitaGLIPtin-metformin 50-1000 MG per tablet  Commonly known as:  JANUMET  Take 1 tablet by mouth 2 (two) times daily with a meal.         Signed: Hosie Spangle, MD 01/02/2015, 7:15 AM

## 2015-01-02 NOTE — Progress Notes (Signed)
Patient alert and oriented, mae's well, voiding adequate amount of urine, swallowing without difficulty, no c/o pain. Patient discharged home with family. Script and discharged instructions given to patient. Patient is inform to stop by the MD office to pick up his lumbar brace. Patient and family stated understanding of d/c instructions given and has an appointment with MD.

## 2015-01-06 ENCOUNTER — Encounter (HOSPITAL_COMMUNITY): Payer: Self-pay | Admitting: Neurosurgery

## 2015-10-21 DIAGNOSIS — E119 Type 2 diabetes mellitus without complications: Secondary | ICD-10-CM | POA: Diagnosis not present

## 2015-10-21 DIAGNOSIS — I10 Essential (primary) hypertension: Secondary | ICD-10-CM | POA: Diagnosis not present

## 2015-10-21 DIAGNOSIS — E1165 Type 2 diabetes mellitus with hyperglycemia: Secondary | ICD-10-CM | POA: Diagnosis not present

## 2015-11-30 DIAGNOSIS — I10 Essential (primary) hypertension: Secondary | ICD-10-CM | POA: Diagnosis not present

## 2015-11-30 DIAGNOSIS — E119 Type 2 diabetes mellitus without complications: Secondary | ICD-10-CM | POA: Diagnosis not present

## 2016-01-12 DIAGNOSIS — M545 Low back pain: Secondary | ICD-10-CM | POA: Diagnosis not present

## 2016-01-12 DIAGNOSIS — G8929 Other chronic pain: Secondary | ICD-10-CM | POA: Diagnosis not present

## 2016-01-12 DIAGNOSIS — Z9889 Other specified postprocedural states: Secondary | ICD-10-CM | POA: Diagnosis not present

## 2016-01-12 DIAGNOSIS — M5136 Other intervertebral disc degeneration, lumbar region: Secondary | ICD-10-CM | POA: Diagnosis not present

## 2016-01-12 DIAGNOSIS — M4726 Other spondylosis with radiculopathy, lumbar region: Secondary | ICD-10-CM | POA: Diagnosis not present

## 2016-01-13 DIAGNOSIS — E119 Type 2 diabetes mellitus without complications: Secondary | ICD-10-CM | POA: Diagnosis not present

## 2016-01-13 DIAGNOSIS — M5416 Radiculopathy, lumbar region: Secondary | ICD-10-CM | POA: Diagnosis not present

## 2016-01-13 DIAGNOSIS — M5116 Intervertebral disc disorders with radiculopathy, lumbar region: Secondary | ICD-10-CM | POA: Diagnosis not present

## 2016-01-13 DIAGNOSIS — I10 Essential (primary) hypertension: Secondary | ICD-10-CM | POA: Diagnosis not present

## 2016-04-13 DIAGNOSIS — I10 Essential (primary) hypertension: Secondary | ICD-10-CM | POA: Diagnosis not present

## 2016-04-13 DIAGNOSIS — M5416 Radiculopathy, lumbar region: Secondary | ICD-10-CM | POA: Diagnosis not present

## 2016-04-13 DIAGNOSIS — E1122 Type 2 diabetes mellitus with diabetic chronic kidney disease: Secondary | ICD-10-CM | POA: Diagnosis not present

## 2016-04-13 DIAGNOSIS — M5116 Intervertebral disc disorders with radiculopathy, lumbar region: Secondary | ICD-10-CM | POA: Diagnosis not present

## 2016-04-13 DIAGNOSIS — E119 Type 2 diabetes mellitus without complications: Secondary | ICD-10-CM | POA: Diagnosis not present

## 2016-04-13 DIAGNOSIS — M2550 Pain in unspecified joint: Secondary | ICD-10-CM | POA: Diagnosis not present

## 2016-05-19 IMAGING — MR MR LUMBAR SPINE W/O CM
4 of 5 series · 19 of 48 positions shown · non-contrast
Comparison: None.

CLINICAL DATA: Severe low back pain radiating to the RIGHT hip with
RIGHT leg numbness, symptoms worsening over the past 4 weeks.
Initial encounter.

EXAM:
MRI LUMBAR SPINE WITHOUT CONTRAST
TECHNIQUE: Multiplanar, multisequence MR imaging of the lumbar spine was
performed. No intravenous contrast was administered.

[Series 3: T1 · sagittal · 4.0mm · 0.55mm/px · 3 of 12 slices shown (1 of 2)]
[im 3/12]
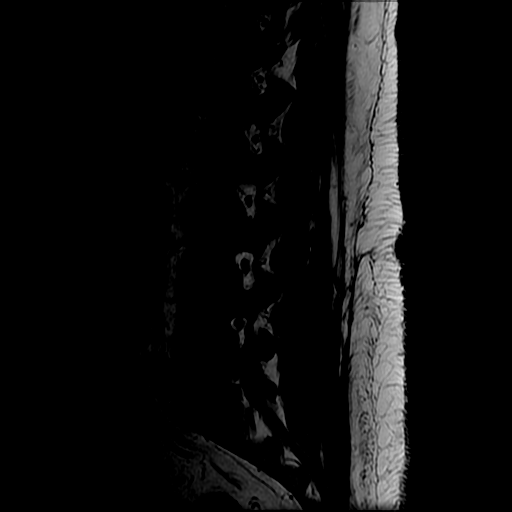
[im 7/12]
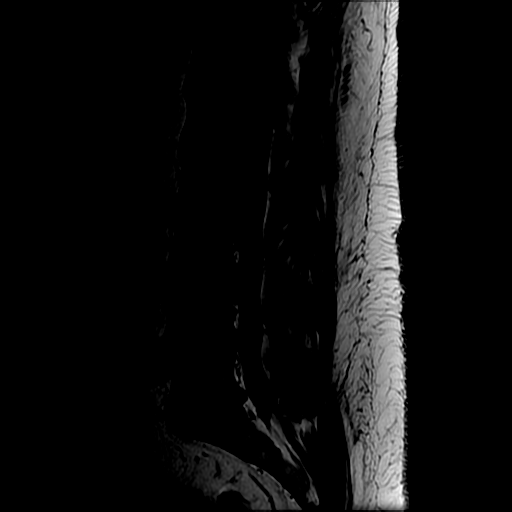
[im 12/12]
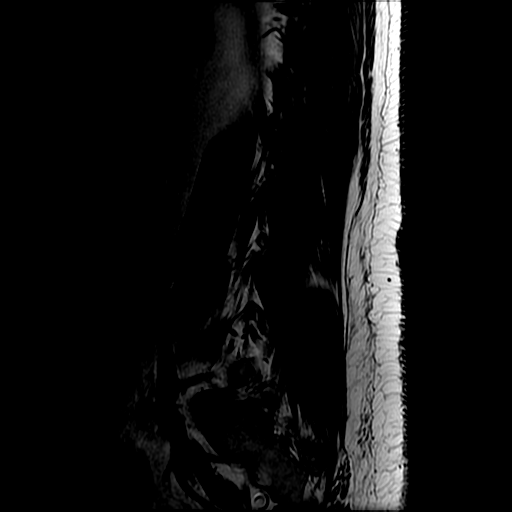

[Series 4: T2 · sagittal · 4.0mm · 0.55mm/px · 5 of 12 slices shown (1 of 2)]
[im 1/12]
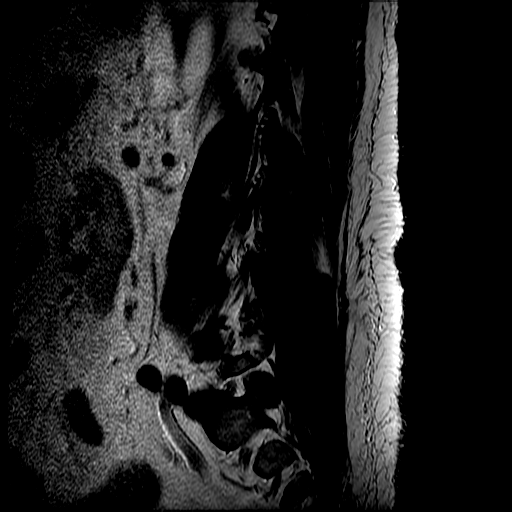
[im 3/12]
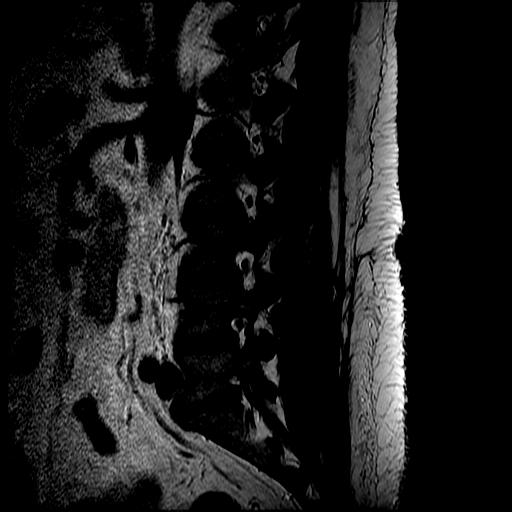
[im 6/12]
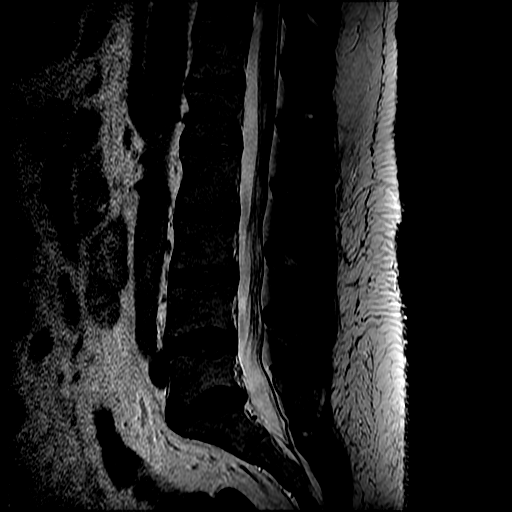
[im 9/12]
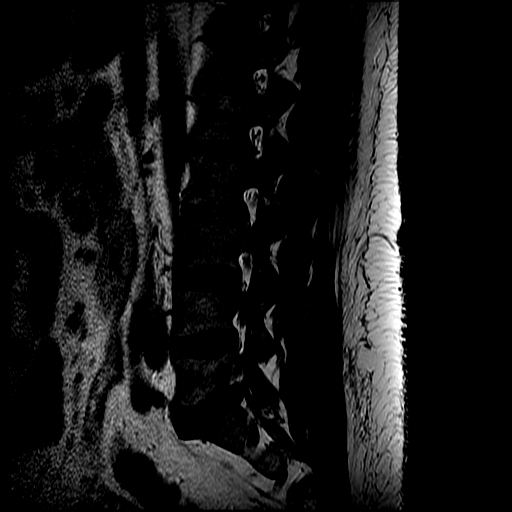
[im 12/12]
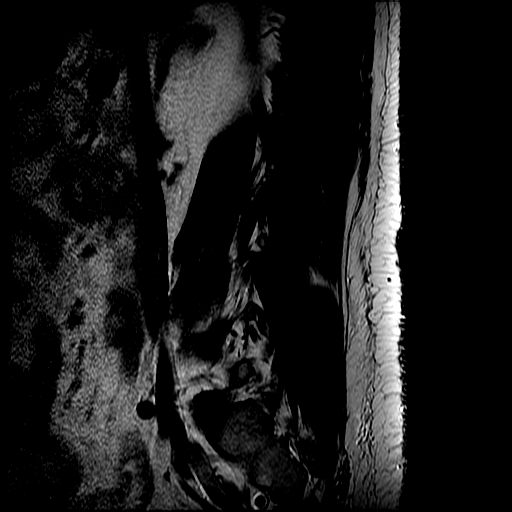

[Series 6: T2 · axial · 4.0mm · 0.39mm/px · z∈[-87,+74]mm · 8 of 35 slices shown (2 of 2)]
[im 3/35]
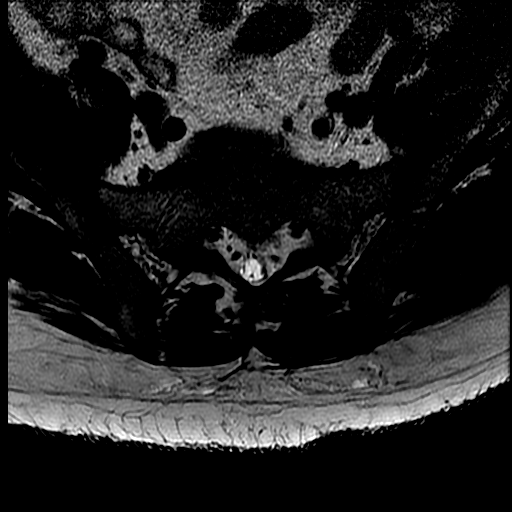
[im 5/35]
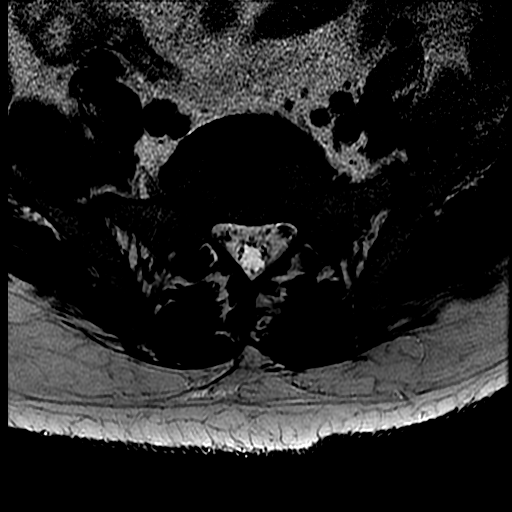
[im 7/35]
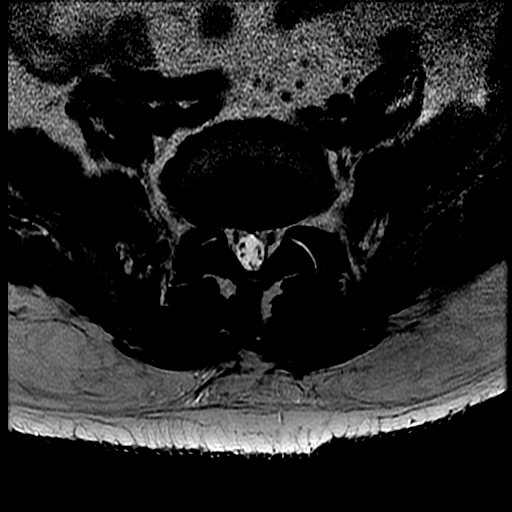
[im 12/35]
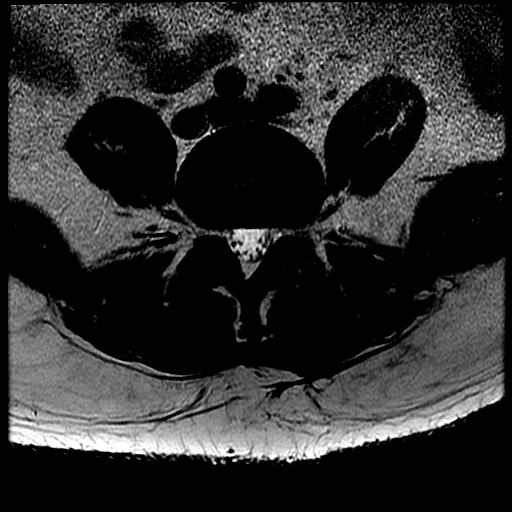
[im 16/35]
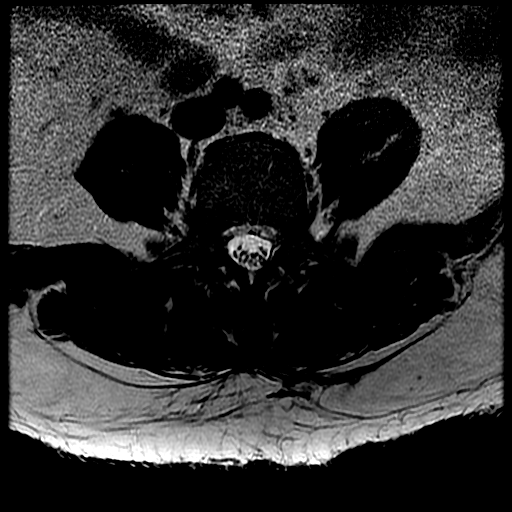
[im 19/35]
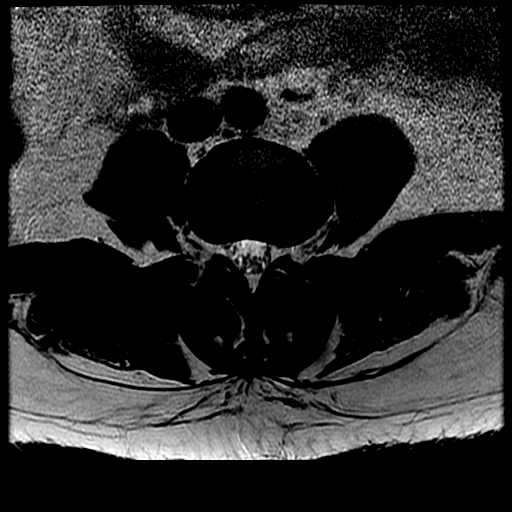
[im 21/35]
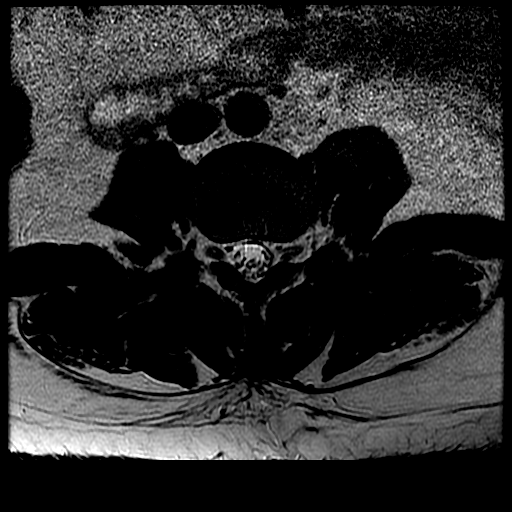
[im 30/35]
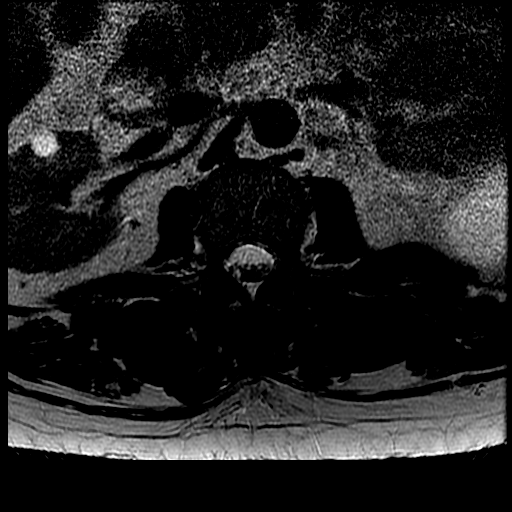

[Series 7: T1 · axial · 4.0mm · 0.39mm/px · z∈[-78,+74]mm · 3 of 35 slices shown (2 of 2)]
[im 5/35]
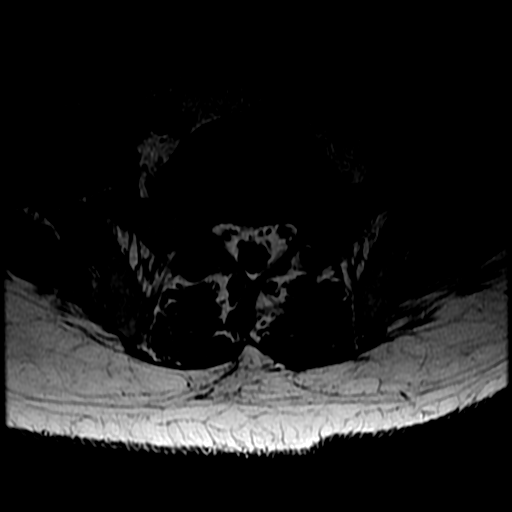
[im 19/35]
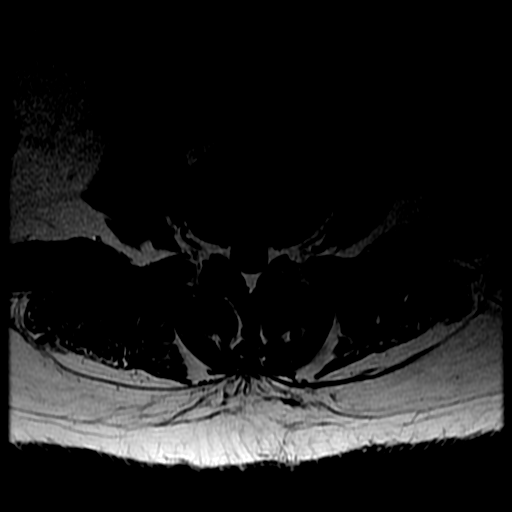
[im 30/35]
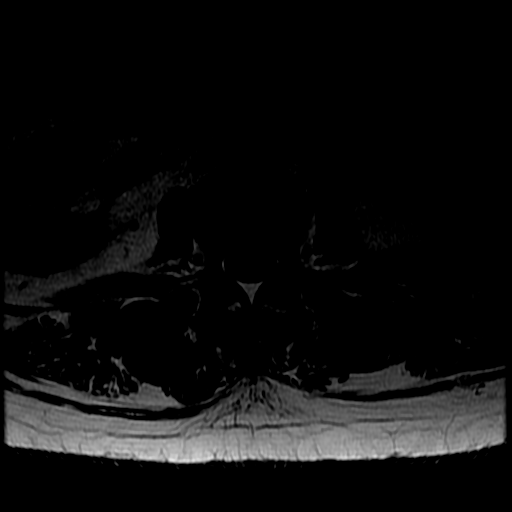

[19 of 48 positions shown; findings below may reference images not displayed]

FINDINGS: Segmentation: Normal segmentation with five lumbar type vertebral
bodies.

Alignment: Mild straightening of the normal cervical lordosis. No
subluxation.

Vertebrae: No worrisome osseous lesion.No pars defects.

Conus medullaris: Normal in size, signal, and location.

Paraspinal tissues: No evidence for hydronephrosis or paravertebral
mass. Renal cystic disease is incompletely evaluated. There is a
large LEFT renal cyst new projecting beyond the field of view;
consider renal sonography for further characterization.

Disc levels:

L1-L2:  Normal.

L2-L3:  Normal.

L3-L4:  Normal.

L4-L5:  Normal.

L5-S1: BILATERAL foraminal and extraforaminal protrusions, much
worse on the RIGHT. Mild facet arthropathy. RIGHT L5 nerve root
impingement is best observed on parasagittal imaging. No
subarticular zone narrowing.
IMPRESSION: Potentially symptomatic foraminal protrusion at L5-S1 on the RIGHT.
Correlate clinically for RIGHT L5 radicular symptoms.

## 2016-07-14 DIAGNOSIS — E119 Type 2 diabetes mellitus without complications: Secondary | ICD-10-CM | POA: Diagnosis not present

## 2016-07-20 DIAGNOSIS — E119 Type 2 diabetes mellitus without complications: Secondary | ICD-10-CM | POA: Diagnosis not present

## 2016-07-20 DIAGNOSIS — I10 Essential (primary) hypertension: Secondary | ICD-10-CM | POA: Diagnosis not present

## 2016-07-20 DIAGNOSIS — M5116 Intervertebral disc disorders with radiculopathy, lumbar region: Secondary | ICD-10-CM | POA: Diagnosis not present

## 2016-07-20 DIAGNOSIS — M5416 Radiculopathy, lumbar region: Secondary | ICD-10-CM | POA: Diagnosis not present

## 2018-10-24 ENCOUNTER — Other Ambulatory Visit: Payer: Self-pay | Admitting: Gastroenterology

## 2018-12-25 ENCOUNTER — Ambulatory Visit (HOSPITAL_COMMUNITY): Admission: RE | Admit: 2018-12-25 | Payer: Medicare Other | Source: Home / Self Care | Admitting: Gastroenterology

## 2018-12-25 ENCOUNTER — Encounter (HOSPITAL_COMMUNITY): Admission: RE | Payer: Self-pay | Source: Home / Self Care

## 2018-12-25 SURGERY — COLONOSCOPY WITH PROPOFOL
Anesthesia: Monitor Anesthesia Care

## 2019-02-22 ENCOUNTER — Other Ambulatory Visit: Payer: Self-pay | Admitting: Gastroenterology

## 2019-03-12 ENCOUNTER — Ambulatory Visit (INDEPENDENT_AMBULATORY_CARE_PROVIDER_SITE_OTHER): Payer: Medicare Other

## 2019-03-12 ENCOUNTER — Other Ambulatory Visit: Payer: Self-pay

## 2019-03-12 ENCOUNTER — Ambulatory Visit (HOSPITAL_COMMUNITY)
Admission: EM | Admit: 2019-03-12 | Discharge: 2019-03-12 | Disposition: A | Discharge status: Home / Self Care | Payer: Medicare Other | Attending: Family Medicine | Admitting: Family Medicine

## 2019-03-12 ENCOUNTER — Encounter (HOSPITAL_COMMUNITY): Payer: Self-pay | Admitting: Emergency Medicine

## 2019-03-12 DIAGNOSIS — I10 Essential (primary) hypertension: Secondary | ICD-10-CM | POA: Diagnosis not present

## 2019-03-12 DIAGNOSIS — R1010 Upper abdominal pain, unspecified: Secondary | ICD-10-CM | POA: Diagnosis present

## 2019-03-12 LAB — COMPREHENSIVE METABOLIC PANEL
ALT: 22 U/L (ref 0–44)
AST: 17 U/L (ref 15–41)
Albumin: 3.9 g/dL (ref 3.5–5.0)
Alkaline Phosphatase: 122 U/L (ref 38–126)
Anion gap: 13 (ref 5–15)
BUN: 17 mg/dL (ref 8–23)
CO2: 25 mmol/L (ref 22–32)
Calcium: 10 mg/dL (ref 8.9–10.3)
Chloride: 96 mmol/L — ABNORMAL LOW (ref 98–111)
Creatinine, Ser: 1.67 mg/dL — ABNORMAL HIGH (ref 0.61–1.24)
GFR calc Af Amer: 47 mL/min — ABNORMAL LOW (ref >60)
GFR calc non Af Amer: 41 mL/min — ABNORMAL LOW (ref >60)
Glucose, Bld: 383 mg/dL — ABNORMAL HIGH (ref 70–99)
Potassium: 5 mmol/L (ref 3.5–5.1)
Sodium: 134 mmol/L — ABNORMAL LOW (ref 135–145)
Total Bilirubin: 0.6 mg/dL (ref 0.3–1.2)
Total Protein: 7.5 g/dL (ref 6.5–8.1)

## 2019-03-12 LAB — CBC
HCT: 50.9 % (ref 39.0–52.0)
Hemoglobin: 15.8 g/dL (ref 13.0–17.0)
MCH: 28.2 pg (ref 26.0–34.0)
MCHC: 31 g/dL (ref 30.0–36.0)
MCV: 90.9 fL (ref 80.0–100.0)
Platelets: 253 10*3/uL (ref 150–400)
RBC: 5.6 MIL/uL (ref 4.22–5.81)
RDW: 11.2 % — ABNORMAL LOW (ref 11.5–15.5)
WBC: 9.3 10*3/uL (ref 4.0–10.5)
nRBC: 0 % (ref 0.0–0.2)

## 2019-03-12 MED ORDER — LIDOCAINE VISCOUS HCL 2 % MT SOLN
15.0000 mL | Freq: Once | OROMUCOSAL | Status: AC
  Administered 2019-03-12: 11:00:00 15 mL via ORAL

## 2019-03-12 MED ORDER — ALUM & MAG HYDROXIDE-SIMETH 200-200-20 MG/5ML PO SUSP
ORAL | Status: AC
  Filled 2019-03-12: qty 30

## 2019-03-12 MED ORDER — ALUM & MAG HYDROXIDE-SIMETH 200-200-20 MG/5ML PO SUSP
30.0000 mL | Freq: Once | ORAL | Status: AC
  Administered 2019-03-12: 30 mL via ORAL

## 2019-03-12 MED ORDER — LIDOCAINE VISCOUS HCL 2 % MT SOLN
OROMUCOSAL | Status: AC
  Filled 2019-03-12: qty 15

## 2019-03-12 NOTE — ED Provider Notes (Signed)
Juan Hatfield    CSN: [id number] Arrival date & time: 03/12/19  5277     History   Chief Complaint Chief Complaint  Patient presents with   Abdominal Cramping    HPI Juan Hatfield is a 73 y.o. male.   Patient reports intermittent abdominal pain over the past 6 months; worse x 2 weeks.  He states it "goes across the top" of his abdomen and then down the right side.  He states it feels like hunger pains and is relieved with food.  He states he feels "gassy" and is belching and passing gas.  He took a laxative yesterday and had good results; last bowel movement today.  He denies chest pain, SOB, fever, chills, nausea, vomiting, diarrhea.  He has not seen his PCP or sought medical care for this pain before today.  He is scheduled for a colonoscopy on 04/02/2019.  The history is provided by the patient.    Past Medical History:  Diagnosis Date   Diabetes mellitus without complication ([place])    Hypertension     Patient Active Problem List   Diagnosis Date Noted   HNP (herniated nucleus pulposus), lumbar 01/01/2015    Past Surgical History:  Procedure Laterality Date   COLONOSCOPY     LUMBAR LAMINECTOMY/DECOMPRESSION MICRODISCECTOMY Right 01/01/2015   Procedure: LUMBAR LAMINECTOMY/DECOMPRESSION MICRODISCECTOMY RIGHT LUMBAR FIVE SACRAL ONE;  Surgeon: Jovita Gamma, MD;  Location: [facility];  Service: Neurosurgery;  Laterality: Right;       Home Medications    Prior to Admission medications   Medication Sig Start Date End Date Taking? Authorizing Provider  allopurinol (ZYLOPRIM) 100 MG tablet Take 100 mg by mouth daily.    [provider]  aspirin 81 MG tablet Take 81 mg by mouth daily.    [provider]  Coenzyme Q10 (CO Q 10) 100 MG CAPS Take 1 tablet by mouth daily.    [provider]  colchicine 0.6 MG tablet Take 0.6 mg by mouth daily.    [provider]  enalapril (VASOTEC) 5 MG tablet Take 5 mg by mouth daily.     [provider]  glimepiride (AMARYL) 4 MG tablet Take 4 mg by mouth 2 (two) times daily.    [provider]  hydrochlorothiazide (HYDRODIURIL) 25 MG tablet Take 25 mg by mouth daily.    [provider]  HYDROcodone-acetaminophen (NORCO/VICODIN) 5-325 MG per tablet Take 1-2 tablets by mouth every 4 (four) hours as needed (mild pain). Patient not taking: Reported on 03/12/2019 01/02/15   Jovita Gamma, MD  Multiple Vitamins-Minerals (MULTIVITAMIN ADULT PO) Take 1 tablet by mouth daily.    [provider]  Multiple Vitamins-Minerals (SUPER ANTIOXIDANT PO) Take 1 tablet by mouth daily.    [provider]  naproxen (NAPROSYN) 375 MG tablet Take 375 mg by mouth 2 (two) times daily with a meal. 11/19/14   [provider]  omega-3 acid ethyl esters (LOVAZA) 1 G capsule Take 1 g by mouth 3 (three) times daily.    [provider]  sitaGLIPtin-metformin (JANUMET) 50-1000 MG per tablet Take 1 tablet by mouth 2 (two) times daily with a meal.    [provider]    Family History No family history on file.  Social History Social History   Tobacco Use   Smoking status: Never Smoker  Substance Use Topics   Alcohol use: Not on file   Drug use: Not on file     Allergies  Other   Review of Systems Review of Systems  Constitutional: Negative for chills and fever.  HENT: Negative for ear pain and sore throat.   Eyes: Negative for pain and visual disturbance.  Respiratory: Negative for cough and shortness of breath.   Cardiovascular: Negative for chest pain and palpitations.  Gastrointestinal: Positive for abdominal pain. Negative for diarrhea and vomiting.  Genitourinary: Negative for dysuria, hematuria and testicular pain.  Musculoskeletal: Negative for arthralgias and back pain.  Skin: Negative for color change and rash.  Neurological: Negative for seizures and syncope.  All other systems reviewed and are  negative.    Physical Exam Triage Vital Signs ED Triage Vitals  Enc Vitals Group     BP 03/12/19 1005 (!) 161/97     Pulse Rate 03/12/19 1005 91     Resp 03/12/19 1005 16     Temp 03/12/19 1005 98.2 F (36.8 C)     Temp Source 03/12/19 1005 Oral     SpO2 03/12/19 1005 97 %     Weight --      Height --      Head Circumference --      Peak Flow --      Pain Score 03/12/19 1008 6     Pain Loc --      Pain Edu? --      Excl. in [place]? --    No data found.  Updated Vital Signs BP (!) 161/97 (BP Location: Left Arm)    Pulse 91    Temp 98.2 F (36.8 C) (Oral)    Resp 16    SpO2 97%   Visual Acuity Right Eye Distance:   Left Eye Distance:   Bilateral Distance:    Right Eye Near:   Left Eye Near:    Bilateral Near:     Physical Exam Vitals signs and nursing note reviewed.  Constitutional:      Appearance: He is well-developed.  HENT:     Head: Normocephalic and atraumatic.     Mouth/Throat:     Mouth: Mucous membranes are moist.     Pharynx: Oropharynx is clear.  Eyes:     Conjunctiva/sclera: Conjunctivae normal.  Neck:     Musculoskeletal: Neck supple.  Cardiovascular:     Rate and Rhythm: Normal rate and regular rhythm.     Heart sounds: Normal heart sounds.  Pulmonary:     Effort: Pulmonary effort is normal. No respiratory distress.     Breath sounds: Normal breath sounds.  Abdominal:     General: Bowel sounds are normal.     Palpations: Abdomen is soft.     Tenderness: There is no abdominal tenderness. There is no right CVA tenderness, left CVA tenderness, guarding or rebound.  Skin:    General: Skin is warm and dry.  Neurological:     Mental Status: He is alert.      UC Treatments / Results  Labs (all labs ordered are listed, but only abnormal results are displayed) Labs Reviewed  CBC - Abnormal; Notable for the following components:      Result Value   RDW 11.2 (*)    All other components within normal limits  COMPREHENSIVE METABOLIC PANEL -  Abnormal; Notable for the following components:   Sodium 134 (*)    Chloride 96 (*)    Glucose, Bld 383 (*)    Creatinine, Ser 1.67 (*)    GFR calc non Af Amer 41 (*)    GFR calc Af Wyvonnia Lora  47 (*)    All other components within normal limits    EKG   Radiology Dg Abdomen 1 View  Result Date: 03/12/2019 CLINICAL DATA:  Abdominal pain for 3-4 weeks. EXAM: ABDOMEN - 1 VIEW COMPARISON:  08/13/2014 FINDINGS: There is scattered air and stool in the colon and scattered air throughout the small bowel but no distension. No free air. The soft tissue shadows are maintained. The bony structures are unremarkable. IMPRESSION: No plain film findings for an acute abdominal process. Electronically Signed   By: Marijo Sanes M.D.   On: 03/12/2019 11:23    Procedures Procedures (including critical care time)  Medications Ordered in UC Medications  alum & mag hydroxide-simeth (MAALOX/MYLANTA) 200-200-20 MG/5ML suspension 30 mL (30 mLs Oral Given 03/12/19 1040)    And  lidocaine (XYLOCAINE) 2 % viscous mouth solution 15 mL (15 mLs Oral Given 03/12/19 1041)  alum & mag hydroxide-simeth (MAALOX/MYLANTA) 200-200-20 MG/5ML suspension (has no administration in time range)  lidocaine (XYLOCAINE) 2 % viscous mouth solution (has no administration in time range)    Initial Impression / Assessment and Plan / UC Course  I have reviewed the triage vital signs and the nursing notes.  Pertinent labs & imaging results that were available during my care of the patient were reviewed by me and considered in my medical decision making (see chart for details).   Abdominal pain.  Elevated blood pressure with known hypertension.  Exam unremarkable.  Pain resolved with Maalox/Mylanta/lidocaine given.  No acute concerns with lab work or EKG.  EKG/labs reviewed, case discussed, and treatment plan reviewed with Dr. Mannie Stabile.  Instructed patient to follow-up with his PCP this week; he states he has an appointment on Thursday.   Discussed with patient that he should review his blood test results with his PCP as he is blood sugar was elevated; this blood test was postprandial.  Discussed with patient that his pain was relieved with the Maalox and Mylanta and that he can try these at home as they are over-the-counter products.  Instructed patient to go to the emergency department if he develops acute abdominal pain, chest pain, shortness of breath, fever, vomiting, diarrhea, or other concerning symptoms.  Reviewed blood pressure with patient and instructed him to follow-up with his PCP for a recheck.     Final Clinical Impressions(s) / UC Diagnoses   Final diagnoses:  Pain of upper abdomen  Elevated blood pressure reading in office with diagnosis of hypertension     Discharge Instructions     Follow-up with your primary care provider this week.    Your blood test do not show any acute concerns but please review the results with your primary care provider when you see them this week.    The medicine that we gave you here today appears to have helped your pain.  This medicine was a combination of Maalox and Mylanta.    Go to the emergency department if your abdominal pain persists or worsens; or if you develop chest pain, shortness of breath, fever, vomiting, diarrhea, or other concerning symptoms.   Your blood pressure was 161/97 today.  Please follow up with your primary care provider to have this rechecked in the next 2-4 weeks.       ED Prescriptions    None     Controlled Substance Prescriptions [place] Controlled Substance Registry consulted? Not Applicable   Sharion Balloon, NP 03/12/19 1212

## 2019-03-12 NOTE — Discharge Instructions (Addendum)
Follow-up with your primary care provider this week.    Your blood test do not show any acute concerns but please review the results with your primary care provider when you see them this week.    The medicine that we gave you here today appears to have helped your pain.  This medicine was a combination of Maalox and Mylanta.    Go to the emergency department if your abdominal pain persists or worsens; or if you develop chest pain, shortness of breath, fever, vomiting, diarrhea, or other concerning symptoms.   Your blood pressure was 161/97 today.  Please follow up with your primary care provider to have this rechecked in the next 2-4 weeks.

## 2019-03-12 NOTE — ED Triage Notes (Signed)
Pt states for the last three weeks hes had "hunger pains" in his upper abdomen traveling downt he R side, pt denies pain with palpation. Denies n/v/d.

## 2019-03-19 ENCOUNTER — Other Ambulatory Visit: Payer: Self-pay | Admitting: Internal Medicine

## 2019-03-19 DIAGNOSIS — R1310 Dysphagia, unspecified: Secondary | ICD-10-CM

## 2019-03-19 DIAGNOSIS — R1319 Other dysphagia: Secondary | ICD-10-CM

## 2019-03-22 ENCOUNTER — Ambulatory Visit
Admission: RE | Admit: 2019-03-22 | Discharge: 2019-03-22 | Disposition: A | Discharge status: Home / Self Care | Payer: Medicare Other | Source: Ambulatory Visit | Attending: Internal Medicine | Admitting: Internal Medicine

## 2019-03-22 DIAGNOSIS — R1310 Dysphagia, unspecified: Secondary | ICD-10-CM

## 2019-03-22 DIAGNOSIS — R1319 Other dysphagia: Secondary | ICD-10-CM

## 2019-03-26 ENCOUNTER — Other Ambulatory Visit: Payer: Self-pay | Admitting: Gastroenterology

## 2019-03-29 ENCOUNTER — Other Ambulatory Visit (HOSPITAL_COMMUNITY)
Admission: RE | Admit: 2019-03-29 | Discharge: 2019-03-29 | Disposition: A | Discharge status: Home / Self Care | Payer: Medicare Other | Source: Ambulatory Visit | Attending: Gastroenterology | Admitting: Gastroenterology

## 2019-03-29 DIAGNOSIS — Z20828 Contact with and (suspected) exposure to other viral communicable diseases: Secondary | ICD-10-CM | POA: Diagnosis not present

## 2019-03-29 DIAGNOSIS — Z01812 Encounter for preprocedural laboratory examination: Secondary | ICD-10-CM | POA: Diagnosis present

## 2019-03-29 LAB — SARS CORONAVIRUS 2 (TAT 6-24 HRS): SARS Coronavirus 2: NEGATIVE

## 2019-04-01 ENCOUNTER — Other Ambulatory Visit: Payer: Self-pay

## 2019-04-01 ENCOUNTER — Encounter (HOSPITAL_COMMUNITY): Payer: Self-pay | Admitting: *Deleted

## 2019-04-02 ENCOUNTER — Encounter (HOSPITAL_COMMUNITY): Payer: Self-pay | Admitting: *Deleted

## 2019-04-02 ENCOUNTER — Ambulatory Visit (HOSPITAL_COMMUNITY): Payer: Medicare Other | Admitting: Certified Registered Nurse Anesthetist

## 2019-04-02 ENCOUNTER — Encounter (HOSPITAL_COMMUNITY)
Admission: RE | Disposition: A | Discharge status: Home / Self Care | Payer: Self-pay | Source: Home / Self Care | Attending: Gastroenterology

## 2019-04-02 ENCOUNTER — Ambulatory Visit (HOSPITAL_COMMUNITY)
Admission: RE | Admit: 2019-04-02 | Discharge: 2019-04-02 | Disposition: A | Discharge status: Home / Self Care | Payer: Medicare Other | Attending: Gastroenterology | Admitting: Gastroenterology

## 2019-04-02 DIAGNOSIS — Z09 Encounter for follow-up examination after completed treatment for conditions other than malignant neoplasm: Secondary | ICD-10-CM | POA: Insufficient documentation

## 2019-04-02 DIAGNOSIS — Z8719 Personal history of other diseases of the digestive system: Secondary | ICD-10-CM | POA: Diagnosis not present

## 2019-04-02 DIAGNOSIS — Z860100 Personal history of colon polyps, unspecified: Secondary | ICD-10-CM

## 2019-04-02 DIAGNOSIS — E1122 Type 2 diabetes mellitus with diabetic chronic kidney disease: Secondary | ICD-10-CM | POA: Insufficient documentation

## 2019-04-02 DIAGNOSIS — Z6835 Body mass index (BMI) 35.0-35.9, adult: Secondary | ICD-10-CM | POA: Diagnosis not present

## 2019-04-02 DIAGNOSIS — I129 Hypertensive chronic kidney disease with stage 1 through stage 4 chronic kidney disease, or unspecified chronic kidney disease: Secondary | ICD-10-CM | POA: Insufficient documentation

## 2019-04-02 DIAGNOSIS — Z79899 Other long term (current) drug therapy: Secondary | ICD-10-CM | POA: Insufficient documentation

## 2019-04-02 DIAGNOSIS — Z7982 Long term (current) use of aspirin: Secondary | ICD-10-CM | POA: Insufficient documentation

## 2019-04-02 DIAGNOSIS — N183 Chronic kidney disease, stage 3 (moderate): Secondary | ICD-10-CM | POA: Diagnosis not present

## 2019-04-02 DIAGNOSIS — K640 First degree hemorrhoids: Secondary | ICD-10-CM | POA: Diagnosis not present

## 2019-04-02 DIAGNOSIS — D122 Benign neoplasm of ascending colon: Secondary | ICD-10-CM | POA: Insufficient documentation

## 2019-04-02 DIAGNOSIS — E669 Obesity, unspecified: Secondary | ICD-10-CM | POA: Insufficient documentation

## 2019-04-02 DIAGNOSIS — E1136 Type 2 diabetes mellitus with diabetic cataract: Secondary | ICD-10-CM | POA: Diagnosis not present

## 2019-04-02 DIAGNOSIS — E7800 Pure hypercholesterolemia, unspecified: Secondary | ICD-10-CM | POA: Insufficient documentation

## 2019-04-02 DIAGNOSIS — Z7984 Long term (current) use of oral hypoglycemic drugs: Secondary | ICD-10-CM | POA: Diagnosis not present

## 2019-04-02 HISTORY — PX: COLONOSCOPY WITH PROPOFOL: SHX5780

## 2019-04-02 HISTORY — PX: POLYPECTOMY: SHX5525

## 2019-04-02 LAB — GLUCOSE, CAPILLARY: Glucose-Capillary: 217 mg/dL — ABNORMAL HIGH (ref 70–99)

## 2019-04-02 SURGERY — COLONOSCOPY WITH PROPOFOL
Anesthesia: Monitor Anesthesia Care

## 2019-04-02 MED ORDER — SODIUM CHLORIDE 0.9 % IV SOLN: INTRAVENOUS | Status: DC

## 2019-04-02 MED ORDER — PROPOFOL 500 MG/50ML IV EMUL
INTRAVENOUS | Status: DC | PRN
  Administered 2019-04-02: 75 ug/kg/min via INTRAVENOUS

## 2019-04-02 MED ORDER — PROPOFOL 10 MG/ML IV BOLUS
INTRAVENOUS | Status: AC
  Filled 2019-04-02: qty 60

## 2019-04-02 MED ORDER — LIDOCAINE 2% (20 MG/ML) 5 ML SYRINGE
INTRAMUSCULAR | Status: DC | PRN
  Administered 2019-04-02: 80 mg via INTRAVENOUS

## 2019-04-02 MED ORDER — LACTATED RINGERS IV SOLN
INTRAVENOUS | Status: AC | PRN
  Administered 2019-04-02: 1000 mL via INTRAVENOUS

## 2019-04-02 MED ORDER — PROPOFOL 10 MG/ML IV BOLUS
INTRAVENOUS | Status: DC | PRN
  Administered 2019-04-02 (×2): 10 mg via INTRAVENOUS
  Administered 2019-04-02: 20 mg via INTRAVENOUS

## 2019-04-02 SURGICAL SUPPLY — 22 items

## 2019-04-02 NOTE — Op Note (Signed)
[facility] Patient Name: Juan Hatfield Procedure Date: 04/02/2019 MRN: [id number] Attending MD: Lear Ng , MD Date of Birth: [birth year] CSN: [id number] Age: 73 Admit Type: Outpatient Procedure:                Colonoscopy Indications:              High risk colon cancer surveillance: Personal                            history of colonic polyps, Last colonoscopy: March                            2016 Providers:                Lear Ng, MD, Angus Seller, William Dalton, Technician Referring MD:             Seward Carol Medicines:                Propofol per Anesthesia, Monitored Anesthesia Care Complications:            No immediate complications. Estimated Blood Loss:     Estimated blood loss: none. Procedure:                Pre-Anesthesia Assessment:                           - Prior to the procedure, a History and Physical                            was performed, and patient medications and                            allergies were reviewed. The patient's tolerance of                            previous anesthesia was also reviewed. The risks                            and benefits of the procedure and the sedation                            options and risks were discussed with the patient.                            All questions were answered, and informed consent                            was obtained. Prior Anticoagulants: The patient has                            taken no previous anticoagulant or antiplatelet                            agents. ASA  Grade Assessment: III - A patient with                            severe systemic disease. After reviewing the risks                            and benefits, the patient was deemed in                            satisfactory condition to undergo the procedure.                           After obtaining informed consent, the colonoscope   was passed under direct vision. Throughout the                            procedure, the patient's blood pressure, pulse, and                            oxygen saturations were monitored continuously. The                            PCF-[id number] ([id number]) Olympus pediatric colonscope                            was introduced through the anus and advanced to the                            the cecum, identified by appendiceal orifice and                            ileocecal valve. The colonoscopy was somewhat                            difficult due to looping and fair prep. Successful                            completion of the procedure was aided by                            straightening and shortening the scope to obtain                            bowel loop reduction and lavage. The patient                            tolerated the procedure well. The quality of the                            bowel preparation was fair and fair but repeated                            irrigation led to a good and adequate prep. The  ileocecal valve, appendiceal orifice, and rectum                            were photographed. Scope In: 9:26:55 AM Scope Out: 9:42:08 AM Scope Withdrawal Time: 0 hours 11 minutes 42 seconds  Total Procedure Duration: 0 hours 15 minutes 13 seconds  Findings:      The perianal and digital rectal examinations were normal.      A 7 mm polyp was found in the ascending colon. The polyp was sessile.       The polyp was removed with a hot snare. Resection and retrieval were       complete. Estimated blood loss: none.      Internal hemorrhoids were found during retroflexion. The hemorrhoids       were small and Grade I (internal hemorrhoids that do not prolapse). Impression:               - Preparation of the colon was fair.                           - One 7 mm polyp in the ascending colon, removed                            with a hot snare. Resected and  retrieved.                           - Internal hemorrhoids. Moderate Sedation:      Not Applicable - Patient had care per Anesthesia. Recommendation:           - Patient has a contact number available for                            emergencies. The signs and symptoms of potential                            delayed complications were discussed with the                            patient. Return to normal activities tomorrow.                            Written discharge instructions were provided to the                            patient.                           - High fiber diet.                           - Await pathology results.                           - No ibuprofen, naproxen, or other non-steroidal                            anti-inflammatory drugs for 2 weeks.                           -  Repeat colonoscopy for surveillance based on                            pathology results. Procedure Code(s):        --- Professional ---                           [phone], Colonoscopy, flexible; with removal of                            tumor(s), polyp(s), or other lesion(s) by snare                            technique Diagnosis Code(s):        --- Professional ---                           Z86.010, Personal history of colonic polyps                           K63.5, Polyp of colon                           K64.0, First degree hemorrhoids CPT copyright 2019 American Medical Association. All rights reserved. The codes documented in this report are preliminary and upon coder review may  be revised to meet current compliance requirements. Lear Ng, MD 04/02/2019 9:55:21 AM This report has been signed electronically. Number of Addenda: 0

## 2019-04-02 NOTE — Discharge Instructions (Signed)
Will call you when pathology results are complete.

## 2019-04-02 NOTE — Interval H&P Note (Signed)
History and Physical Interval Note:  04/02/2019 9:13 AM  Doc L Lapp  has presented today for surgery, with the diagnosis of History of colon polyps.  The various methods of treatment have been discussed with the patient and family. After consideration of risks, benefits and other options for treatment, the patient has consented to  Procedure(s): COLONOSCOPY WITH PROPOFOL (N/A) as a surgical intervention.  The patient's history has been reviewed, patient examined, no change in status, stable for surgery.  I have reviewed the patient's chart and labs.  Questions were answered to the patient's satisfaction.     Juan Hatfield

## 2019-04-02 NOTE — Transfer of Care (Signed)
Immediate Anesthesia Transfer of Care Note  Patient: Juan Hatfield  Procedure(s) Performed: COLONOSCOPY WITH PROPOFOL (N/A ) POLYPECTOMY  Patient Location: Endoscopy Unit  Anesthesia Type:MAC  Level of Consciousness: drowsy and patient cooperative  Airway & Oxygen Therapy: Patient Spontanous Breathing and Patient connected to face mask oxygen  Post-op Assessment: Report given to RN and Post -op Vital signs reviewed and stable  Post vital signs: Reviewed and stable  Last Vitals:  Vitals Value Taken Time  BP    Temp    Pulse    Resp    SpO2      Last Pain:  Vitals:   04/02/19 0751  TempSrc: Temporal  PainSc: 0-No pain         Complications: No apparent anesthesia complications

## 2019-04-02 NOTE — Anesthesia Preprocedure Evaluation (Addendum)
Anesthesia Evaluation  Patient identified by MRN, date of birth, ID band Patient awake    Reviewed: Allergy & Precautions, NPO status , Patient's Chart, lab work & pertinent test results  History of Anesthesia Complications Negative for: history of anesthetic complications  Airway Mallampati: II  TM Distance: >3 FB Neck ROM: Full    Dental  (+) Upper Dentures, Lower Dentures   Pulmonary neg pulmonary ROS,    Pulmonary exam normal        Cardiovascular hypertension, Pt. on medications (-) anginaNormal cardiovascular exam     Neuro/Psych negative neurological ROS  negative psych ROS   GI/Hepatic negative GI ROS, Neg liver ROS,   Endo/Other  diabetes, Type 2, Oral Hypoglycemic Agents Obesity   Renal/GU negative Renal ROS     Musculoskeletal negative musculoskeletal ROS (+)   Abdominal   Peds  Hematology negative hematology ROS (+)   Anesthesia Other Findings   Reproductive/Obstetrics                            Anesthesia Physical Anesthesia Plan  ASA: III  Anesthesia Plan: MAC   Post-op Pain Management:    Induction: Intravenous  PONV Risk Score and Plan: 1 and Propofol infusion and Treatment may vary due to age or medical condition  Airway Management Planned: Natural Airway and Simple Face Mask  Additional Equipment: None  Intra-op Plan:   Post-operative Plan:   Informed Consent: I have reviewed the patients History and Physical, chart, labs and discussed the procedure including the risks, benefits and alternatives for the proposed anesthesia with the patient or authorized representative who has indicated his/her understanding and acceptance.       Plan Discussed with: CRNA and Anesthesiologist  Anesthesia Plan Comments:        Anesthesia Quick Evaluation

## 2019-04-02 NOTE — H&P (Signed)
Date of Initial H&P: 03/26/19  History reviewed, patient examined, no change in status, stable for surgery.

## 2019-04-02 NOTE — Anesthesia Postprocedure Evaluation (Signed)
Anesthesia Post Note  Patient: Juan Hatfield  Procedure(s) Performed: COLONOSCOPY WITH PROPOFOL (N/A ) POLYPECTOMY     Patient location during evaluation: PACU Anesthesia Type: MAC Level of consciousness: awake and alert Pain management: pain level controlled Vital Signs Assessment: post-procedure vital signs reviewed and stable Respiratory status: spontaneous breathing, nonlabored ventilation and respiratory function stable Cardiovascular status: stable and blood pressure returned to baseline Anesthetic complications: no    Last Vitals:  Vitals:   04/02/19 1000 04/02/19 1005  BP: 133/88 (!) 145/89  Pulse: 73 77  Resp: 20 15  Temp:    SpO2: 100% 100%    Last Pain:  Vitals:   04/02/19 1000  TempSrc:   PainSc: 0-No pain                 Audry Pili

## 2019-04-03 ENCOUNTER — Encounter (HOSPITAL_COMMUNITY): Payer: Self-pay | Admitting: Gastroenterology

## 2020-06-17 ENCOUNTER — Telehealth: Payer: Self-pay

## 2020-06-17 NOTE — Telephone Encounter (Signed)
NOTES ON FILE FROM [facility] [phone], SENT REFERRAL TO SCHEDULING 

## 2020-06-25 ENCOUNTER — Ambulatory Visit: Payer: Medicare Other | Admitting: Cardiology

## 2020-06-25 ENCOUNTER — Encounter: Payer: Self-pay | Admitting: Cardiology

## 2020-06-25 ENCOUNTER — Encounter (INDEPENDENT_AMBULATORY_CARE_PROVIDER_SITE_OTHER): Payer: Self-pay

## 2020-06-25 ENCOUNTER — Other Ambulatory Visit: Payer: Self-pay

## 2020-06-25 VITALS — BP 132/70 | HR 74 | Ht 66.0 in | Wt 234.0 lb

## 2020-06-25 DIAGNOSIS — R079 Chest pain, unspecified: Secondary | ICD-10-CM | POA: Diagnosis not present

## 2020-06-25 DIAGNOSIS — I10 Essential (primary) hypertension: Secondary | ICD-10-CM | POA: Diagnosis not present

## 2020-06-25 DIAGNOSIS — E119 Type 2 diabetes mellitus without complications: Secondary | ICD-10-CM

## 2020-06-25 DIAGNOSIS — R072 Precordial pain: Secondary | ICD-10-CM | POA: Diagnosis not present

## 2020-06-25 NOTE — Patient Instructions (Signed)
Medication Instructions:  Your physician recommends that you continue on your current medications as directed. Please refer to the Current Medication list given to you today.  *If you need a refill on your cardiac medications before your next appointment, please call your pharmacy*  Testing/Procedures: Your physician has requested that you have a lexiscan myoview. For further information please visit [email or web address]. Please follow instruction sheet, as given.  Your physician has requested that you have a coronary calcium score scan.    Follow-Up: At [facility], you and your health needs are our priority.  As part of our continuing mission to provide you with exceptional heart care, we have created designated Provider Care Teams.  These Care Teams include your primary Cardiologist (physician) and Advanced Practice Providers (APPs -  Physician Assistants and Nurse Practitioners) who all work together to provide you with the care you need, when you need it.  Follow up with Dr. Radford Pax as needed based on results of testing.

## 2020-06-25 NOTE — Addendum Note (Signed)
Addended by: Antonieta Iba on: 06/25/2020 10:18 AM   Modules accepted: Orders

## 2020-06-25 NOTE — Progress Notes (Signed)
Cardiology Consult  Note    Date:  06/25/2020   ID:  Juan Hatfield, DOB 1946-01-18, MRN [id number]  PCP:  Pcp, No  Cardiologist:  Fransico Him, MD   Chief Complaint  Patient presents with  . New Patient (Initial Visit)    Chest pain     History of Present Illness:  Juan Hatfield is a 74 y.o. male who is being seen today for the evaluation of chest pain at the request of Wynelle Fanny, DO.  This is a 74yo AAM with a hx of DM and HTN who is referred for evaluation of chest pain. He tells me that he had been outside cutting the grass and had a funny feeling in his chest that felt like indigestion.  He was walking very fast and had just eaten and went out to mow the yard.  There was no radiation of the discomfort or associated sx of nausea, diaphoresis or SOB.  He continued to [facility] but slowed down and the symptoms resolved.  He denies any DOE, SOB, LE edema, dizziness, palpitations or syncope.    Past Medical History:  Diagnosis Date  . Diabetes mellitus without complication ([place])   . Hypertension     Past Surgical History:  Procedure Laterality Date  . COLONOSCOPY    . COLONOSCOPY WITH PROPOFOL N/A 04/02/2019   Procedure: COLONOSCOPY WITH PROPOFOL;  Surgeon: Wilford Corner, MD;  [facility];  Service: Endoscopy;  Laterality: N/A;  . LUMBAR LAMINECTOMY/DECOMPRESSION MICRODISCECTOMY Right 01/01/2015   Procedure: LUMBAR LAMINECTOMY/DECOMPRESSION MICRODISCECTOMY RIGHT LUMBAR FIVE SACRAL ONE;  Surgeon: Jovita Gamma, MD;  Location: [place] [facility] OR[facility];  Service: Neurosurgery;  Laterality: Right;  . POLYPECTOMY  04/02/2019   Procedure: POLYPECTOMY;  Surgeon: Wilford Corner, MD;  [facility];  Service: Endoscopy;;    Current Medications: Current Meds  Medication Sig  . aspirin 81 MG tablet Take 81 mg by mouth daily.  . Coenzyme Q10 (CO Q 10) 100 MG CAPS Take 1 tablet by mouth daily.  Marland Kitchen glimepiride (AMARYL) 4 MG tablet Take 4 mg by mouth 2 (two) times daily.  Marland Kitchen  LEVEMIR FLEXTOUCH 100 UNIT/ML FlexPen Inject 30 Units into the skin daily.   Marland Kitchen losartan (COZAAR) 25 MG tablet Take 25 mg by mouth daily.  . Multiple Vitamins-Minerals (MULTIVITAMIN ADULT PO) Take 1 tablet by mouth daily.  . Multiple Vitamins-Minerals (SUPER ANTIOXIDANT PO) Take 1 tablet by mouth daily.  . Omega-3 Fatty Acids (FISH OIL) 1000 MG CAPS Take 1,000 mg by mouth daily.  . rosuvastatin (CRESTOR) 5 MG tablet Take 5 mg by mouth daily.  . sitaGLIPtin-metformin (JANUMET) 50-1000 MG per tablet Take 1 tablet by mouth 2 (two) times daily with a meal.    Allergies:   Other   Social History   Socioeconomic History  . Marital status: Widowed    Spouse name: Not on file  . Number of children: Not on file  . Years of education: Not on file  . Highest education level: Not on file  Occupational History  . Not on file  Tobacco Use  . Smoking status: Never Smoker  . Smokeless tobacco: Never Used  Vaping Use  . Vaping Use: Never used  Substance and Sexual Activity  . Alcohol use: Not Currently  . Drug use: Never  . Sexual activity: Not on file  Other Topics Concern  . Not on file  Social History Narrative  . Not on file   Social Determinants of Health   Financial Resource  Strain:   . Difficulty of Paying Living Expenses: Not on file  Food Insecurity:   . Worried About Charity fundraiser in the Last Year: Not on file  . Ran Out of Food in the Last Year: Not on file  Transportation Needs:   . Lack of Transportation (Medical): Not on file  . Lack of Transportation (Non-Medical): Not on file  Physical Activity:   . Days of Exercise per Week: Not on file  . Minutes of Exercise per Session: Not on file  Stress:   . Feeling of Stress : Not on file  Social Connections:   . Frequency of Communication with Friends and Family: Not on file  . Frequency of Social Gatherings with Friends and Family: Not on file  . Attends Religious Services: Not on file  . Active Member of Clubs or  Organizations: Not on file  . Attends Archivist Meetings: Not on file  . Marital Status: Not on file     Family History:  The patient's family history includes CAD in his brother; Cholecystitis in his mother; Lung disease in his father.   ROS:   Please see the history of present illness.    ROS All other systems reviewed and are negative.  No flowsheet data found.     PHYSICAL EXAM:   VS:  BP 132/70 (BP Location: Left Arm, Patient Position: Sitting, Cuff Size: Large)   Pulse 74   Ht 5\' 6"  (1.676 m)   Wt 234 lb (106.1 kg)   BMI 37.77 kg/m    GEN: Well nourished, well developed, in no acute distress  HEENT: normal  Neck: no JVD, carotid bruits, or masses Cardiac: RRR; no murmurs, rubs, or gallops,no edema.  Intact distal pulses bilaterally.  Respiratory:  clear to auscultation bilaterally, normal work of breathing GI: soft, nontender, nondistended, + BS MS: no deformity or atrophy  Skin: warm and dry, no rash Neuro:  Alert and Oriented x 3, Strength and sensation are intact Psych: euthymic mood, full affect  Wt Readings from Last 3 Encounters:  06/25/20 234 lb (106.1 kg)  04/02/19 217 lb (98.4 kg)  01/01/15 229 lb (103.9 kg)      Studies/Labs Reviewed:   EKG:  EKG is ordered today.  The ekg ordered today demonstrates NSR with RBBB and inferior infarct  Recent Labs: No results found for requested labs within last 8760 hours.   Lipid Panel No results found for: CHOL, TRIG, HDL, CHOLHDL, VLDL, LDLCALC, LDLDIRECT   Additional studies/ records that were reviewed today include:  OV notes from PCP, EKG    ASSESSMENT:    1. Chest pain of uncertain etiology   2. Benign essential HTN   3. DM type 2, goal HbA1c < 7% (HCC)      PLAN:  In order of problems listed above:  1. Chest pain -chest discomfort was very vague and only happened once and occurred when he went to mow the yard right after eating -he thinks it was indigestion -his CRFs include  advanced age, HTN, DM -EKG shows RBBB and inferior infarct -I will get a Lexiscan myoview given abnormal EKG and chest discomfort given his DM (SCr elevated so will avoid coronary CTA) -Coronary Ca score to assess future risk  2.  HTN -BP controlled -continue Losartan 25mg  daily  3.  DM2 -followed by PCP -continue Janumet, ARB and statin    Medication Adjustments/Labs and Tests Ordered: Current medicines are reviewed at length with the patient today.  Concerns regarding medicines are outlined above.  Medication changes, Labs and Tests ordered today are listed in the Patient Instructions below.  There are no Patient Instructions on file for this visit.   Signed, Fransico Him, MD  06/25/2020 9:59 AM    [facility] [place], [place], [place]  [address] Phone: [phone]; Fax: [phone]

## 2020-07-01 ENCOUNTER — Telehealth (HOSPITAL_COMMUNITY): Payer: Self-pay

## 2020-07-01 NOTE — Telephone Encounter (Signed)
Pt not home ,family asked for [place] to call back. S.Donita Newland EMTP

## 2020-07-07 ENCOUNTER — Ambulatory Visit (HOSPITAL_COMMUNITY): Payer: Medicare Other | Attending: Cardiology

## 2020-07-07 ENCOUNTER — Other Ambulatory Visit: Payer: Self-pay

## 2020-07-07 ENCOUNTER — Ambulatory Visit (INDEPENDENT_AMBULATORY_CARE_PROVIDER_SITE_OTHER)
Admission: RE | Admit: 2020-07-07 | Discharge: 2020-07-07 | Disposition: A | Discharge status: Home / Self Care | Payer: Self-pay | Source: Ambulatory Visit | Attending: Cardiology | Admitting: Cardiology

## 2020-07-07 DIAGNOSIS — R079 Chest pain, unspecified: Secondary | ICD-10-CM

## 2020-07-07 DIAGNOSIS — R072 Precordial pain: Secondary | ICD-10-CM

## 2020-07-07 LAB — MYOCARDIAL PERFUSION IMAGING
LV dias vol: 91 mL (ref 62–150)
LV sys vol: 34 mL
Peak HR: 93 {beats}/min
Rest HR: 70 {beats}/min
SDS: 5
SRS: 2
SSS: 7
TID: 0.96

## 2020-07-07 MED ORDER — TECHNETIUM TC 99M TETROFOSMIN IV KIT
10.9000 | PACK | Freq: Once | INTRAVENOUS | Status: AC | PRN
  Administered 2020-07-07: 10.9 via INTRAVENOUS
  Filled 2020-07-07: qty 11

## 2020-07-07 MED ORDER — REGADENOSON 0.4 MG/5ML IV SOLN
0.4000 mg | Freq: Once | INTRAVENOUS | Status: AC
  Administered 2020-07-07: 0.4 mg via INTRAVENOUS

## 2020-07-07 MED ORDER — TECHNETIUM TC 99M TETROFOSMIN IV KIT
30.7000 | PACK | Freq: Once | INTRAVENOUS | Status: AC | PRN
  Administered 2020-07-07: 30.7 via INTRAVENOUS
  Filled 2020-07-07: qty 31

## 2020-07-11 NOTE — Progress Notes (Addendum)
Virtual Visit via Telephone Note   This visit type was conducted due to national recommendations for restrictions regarding the COVID-19 Pandemic (e.g. social distancing) in an effort to limit this patient's exposure and mitigate transmission in our community.  Due to his co-morbid illnesses, this patient is at least at moderate risk for complications without adequate follow up.  This format is felt to be most appropriate for this patient at this time.  The patient did not have access to video technology/had technical difficulties with video requiring transitioning to audio format only (telephone).  All issues noted in this document were discussed and addressed.  No physical exam could be performed with this format.  Please refer to the patient's chart for his  consent to telehealth for [facility].   The patient was identified using 2 identifiers.  Date:  07/14/2020   ID:  [name], DOB Dec 21, 1945, MRN [id number]  Patient Location: Home Provider Location: Office/Clinic  PCP:  Pcp, No  Cardiologist:  Fransico Him, MD  Electrophysiologist:  None   Evaluation Performed:  Follow-Up Visit  Chief Complaint: f/u abnormal stress test  History of Present Illness:    Juan Hatfield is a 74 y.o. male with DM, HTN, ?renal insufficiency (Cr 1.67 by labs 2020), RBBB, refusal of blood products who presents to discuss cardiac catheterization at the request of Dr. Radford Pax. She recently evaluated the patient in clinic for vague chest pain, after he went to mow the yard after eating. Coronary calcium score was elevated at 93rd percentile. Nuclear stress test was abnormal with "mixed defect, medium size and moderate severity, in the inferior and inferoseptal walls. The basal inferior/inferoseptal wall demonstrates partial reversibility, while the mid to distal inferior wall is similar or slightly improved perfusion with stress. This is an intermediate risk test given the size of the defect."  He  is seen back for follow-up today virtually to discuss cath. His daughter is also listening in. He has not had any recurrent chest pain recently. His blood pressure is elevated today possibly due to some anxiety about the virtual visit. He has not had any dyspnea, diaphoresis, syncope or palpitations. He was unaware of prior diagnosis of CKD.  Labs Independently Reviewed 02/2019 Na 134, K 5.0, Cr 1.67, LFTs wnl, CBC wnl   Past Medical History:  Diagnosis Date  . 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 Diabetes mellitus ([place])   . Hypertension   . Renal insufficiency    Past Surgical History:  Procedure Laterality Date  . COLONOSCOPY    . COLONOSCOPY WITH PROPOFOL N/A 04/02/2019   Procedure: COLONOSCOPY WITH PROPOFOL;  Surgeon: Wilford Corner, MD;  [facility];  Service: Endoscopy;  Laterality: N/A;  . LUMBAR LAMINECTOMY/DECOMPRESSION MICRODISCECTOMY Right 01/01/2015   Procedure: LUMBAR LAMINECTOMY/DECOMPRESSION MICRODISCECTOMY RIGHT LUMBAR FIVE SACRAL ONE;  Surgeon: Jovita Gamma, MD;  Location: [place] NEURO ORS;  Service: Neurosurgery;  Laterality: Right;  . POLYPECTOMY  04/02/2019   Procedure: POLYPECTOMY;  Surgeon: Wilford Corner, MD;  [facility];  Service: Endoscopy;;     Current Meds  Medication Sig  . aspirin 81 MG tablet Take 81 mg by mouth daily.  . Coenzyme Q10 (CO Q 10) 100 MG CAPS Take 1 tablet by mouth daily.  Marland Kitchen glimepiride (AMARYL) 4 MG tablet Take 4 mg by mouth 2 (two) times daily.  Marland Kitchen LEVEMIR FLEXTOUCH 100 UNIT/ML FlexPen Inject 30 Units into the skin daily.   Marland Kitchen losartan (COZAAR) 25 MG tablet Take 25  mg by mouth daily.  . Multiple Vitamins-Minerals (MULTIVITAMIN ADULT PO) Take 1 tablet by mouth daily.  . Multiple Vitamins-Minerals (SUPER ANTIOXIDANT PO) Take 1 tablet by mouth daily.  . Omega-3 Fatty Acids (FISH OIL) 1000 MG CAPS Take 1,000 mg by mouth daily.  . rosuvastatin (CRESTOR) 5 MG tablet Take 5 mg by mouth daily.  .  sitaGLIPtin-metformin (JANUMET) 50-1000 MG per tablet Take 1 tablet by mouth 2 (two) times daily with a meal.     Allergies:   Other   Social History   Tobacco Use  . Smoking status: Never Smoker  . Smokeless tobacco: Never Used  Vaping Use  . Vaping Use: Never used  Substance Use Topics  . Alcohol use: Not Currently  . Drug use: Never     Family Hx: The patient's family history includes CAD in his brother; Cholecystitis in his mother; Lung disease in his father.  ROS:   Please see the history of present illness.     All other systems reviewed and are negative.   Prior CV studies:   The following studies were reviewed today:  Ca Score 07/07/20 FINDINGS: Non-cardiac: See separate report from [facility].  Ascending Aorta: Normal Caliber.  No calcifications.  Pericardium: Normal  Coronary arteries: Normal coronary origins.  IMPRESSION: Coronary calcium score of 987. This was 93rd percentile for age and sex matched control.  FINDINGS: Within the visualized portions of the thorax there are no suspicious appearing pulmonary nodules or masses, there is no acute consolidative airspace disease, no pleural effusions, no pneumothorax and no lymphadenopathy. Small left-sided Bochdalek's hernia incidentally noted. Visualized portions of the upper abdomen are unremarkable. Multiple old healed right-sided rib fractures. There are no aggressive appearing lytic or blastic lesions noted in the visualized portions of the skeleton.  IMPRESSION: 1. No significant incidental noncardiac findings are noted.    Labs/Other Tests and Data Reviewed:    EKG:  An ECG dated 06/25/20 was personally reviewed today and demonstrated:  NSR 74bpm RBBB prior inferior infarct  Recent Labs: No results found for requested labs within last 8760 hours.   Recent Lipid Panel No results found for: CHOL, TRIG, HDL, CHOLHDL, LDLCALC, LDLDIRECT  Wt Readings from Last 3 Encounters:    07/14/20 234 lb (106.1 kg)  07/07/20 234 lb (106.1 kg)  06/25/20 234 lb (106.1 kg)     Objective:    Vital Signs:  BP (!) 168/89   Pulse 89   Ht 5\' 6"  (1.676 m)   Wt 234 lb (106.1 kg)   BMI 37.77 kg/m    VS reviewed. General - calm M in no acute distress Pulm - No labored breathing, no coughing during visit, no audible wheezing, speaking in full sentences Neuro - A+Ox3, no slurred speech, answers questions appropriately Psych - Pleasant affect  ASSESSMENT & PLAN:    1. Abnormal stress test - Dr. Radford Pax is recommending cardiac cath given his abnormal stress test and elevated calcium score. Risks and benefits of cardiac catheterization have been discussed with the patient.  These include bleeding, infection, kidney damage, stroke, heart attack, death.  The patient understands these risks and is willing to proceed. We will arrange. Would avoid LV gram given CKD and known EF from stress test. Would recommend radial stick if possible given patient's refusal of blood products. Given suspected CKD, we will bring him in earlier than usual for pre-cath hydration the day of his procedure and hold ARB. Will have him come in for pre-cath labs including  CMET, CBC, fasting lipid profile. Continue ASA and statin. See remarks below otherwise. Warning sx/ER precautions also reviewed.  2. Coronary calcification by Ca score - plan as outlined above. I anticipate he would benefit from statin titration as well, but would like to see labs first as above.  3. Essential HTN - BPs were similarly elevated during stress test visit recently. He was 132/70 at the [facility] with Dr. Radford Pax. Will add low dose carvedilol 3.125mg  BID to start and continue losartan for now. I chose not to titrate the dose of losartan due to his CKD and upcoming cath.  4. Renal insufficiency of unknown duration - will obtain updated labs today.   Time:   Today, I have spent 17 minutes with the patient with telehealth technology discussing  the above problems.     Medication Adjustments/Labs and Tests Ordered: Current medicines are reviewed at length with the patient today.  Testing and concerns regarding medicines are outlined above.    Follow Up:  2 weeks in person post-cath with Dr. Radford Pax or APP  Signed, Charlie Pitter, PA-C  07/14/2020 10:55 AM    [place]

## 2020-07-11 NOTE — H&P (View-Only) (Signed)
Virtual Visit via Telephone Note   This visit type was conducted due to national recommendations for restrictions regarding the COVID-19 Pandemic (e.g. social distancing) in an effort to limit this patient's exposure and mitigate transmission in our community.  Due to his co-morbid illnesses, this patient is at least at moderate risk for complications without adequate follow up.  This format is felt to be most appropriate for this patient at this time.  The patient did not have access to video technology/had technical difficulties with video requiring transitioning to audio format only (telephone).  All issues noted in this document were discussed and addressed.  No physical exam could be performed with this format.  Please refer to the patient's chart for his  consent to telehealth for [facility].   The patient was identified using 2 identifiers.  Date:  07/14/2020   ID:  Juan Hatfield, DOB August 05, 1946, MRN [id number]  Patient Location: Home Provider Location: Office/Clinic  PCP:  Pcp, No  Cardiologist:  Fransico Him, MD  Electrophysiologist:  None   Evaluation Performed:  Follow-Up Visit  Chief Complaint: f/u abnormal stress test  History of Present Illness:    Juan Hatfield is a 74 y.o. male with DM, HTN, ?renal insufficiency (Cr 1.67 by labs 2020), RBBB, refusal of blood products who presents to discuss cardiac catheterization at the request of Dr. Radford Pax. She recently evaluated the patient in clinic for vague chest pain, after he went to mow the yard after eating. Coronary calcium score was elevated at 93rd percentile. Nuclear stress test was abnormal with "mixed defect, medium size and moderate severity, in the inferior and inferoseptal walls. The basal inferior/inferoseptal wall demonstrates partial reversibility, while the mid to distal inferior wall is similar or slightly improved perfusion with stress. This is an intermediate risk test given the size of the defect."  He  is seen back for follow-up today virtually to discuss cath. His daughter is also listening in. He has not had any recurrent chest pain recently. His blood pressure is elevated today possibly due to some anxiety about the virtual visit. He has not had any dyspnea, diaphoresis, syncope or palpitations. He was unaware of prior diagnosis of CKD.  Labs Independently Reviewed 02/2019 Na 134, K 5.0, Cr 1.67, LFTs wnl, CBC wnl   Past Medical History:  Diagnosis Date  . 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 Diabetes mellitus ([place])   . Hypertension   . Renal insufficiency    Past Surgical History:  Procedure Laterality Date  . COLONOSCOPY    . COLONOSCOPY WITH PROPOFOL N/A 04/02/2019   Procedure: COLONOSCOPY WITH PROPOFOL;  Surgeon: Wilford Corner, MD;  [facility];  Service: Endoscopy;  Laterality: N/A;  . LUMBAR LAMINECTOMY/DECOMPRESSION MICRODISCECTOMY Right 01/01/2015   Procedure: LUMBAR LAMINECTOMY/DECOMPRESSION MICRODISCECTOMY RIGHT LUMBAR FIVE SACRAL ONE;  Surgeon: Jovita Gamma, MD;  Location: [place] [facility]O ORS;  Service: Neurosurgery;  Laterality: Right;  . POLYPECTOMY  04/02/2019   Procedure: POLYPECTOMY;  Surgeon: Wilford Corner, MD;  [facility];  Service: Endoscopy;;     Current Meds  Medication Sig  . aspirin 81 MG tablet Take 81 mg by mouth daily.  . Coenzyme Q10 (CO Q 10) 100 MG CAPS Take 1 tablet by mouth daily.  Marland Kitchen glimepiride (AMARYL) 4 MG tablet Take 4 mg by mouth 2 (two) times daily.  Marland Kitchen LEVEMIR FLEXTOUCH 100 UNIT/ML FlexPen Inject 30 Units into the skin daily.   Marland Kitchen losartan (COZAAR) 25 MG tablet Take 25  mg by mouth daily.  . Multiple Vitamins-Minerals (MULTIVITAMIN ADULT PO) Take 1 tablet by mouth daily.  . Multiple Vitamins-Minerals (SUPER ANTIOXIDANT PO) Take 1 tablet by mouth daily.  . Omega-3 Fatty Acids (FISH OIL) 1000 MG CAPS Take 1,000 mg by mouth daily.  . rosuvastatin (CRESTOR) 5 MG tablet Take 5 mg by mouth daily.  .  sitaGLIPtin-metformin (JANUMET) 50-1000 MG per tablet Take 1 tablet by mouth 2 (two) times daily with a meal.     Allergies:   Other   Social History   Tobacco Use  . Smoking status: Never Smoker  . Smokeless tobacco: Never Used  Vaping Use  . Vaping Use: Never used  Substance Use Topics  . Alcohol use: Not Currently  . Drug use: Never     Family Hx: The patient's family history includes CAD in his brother; Cholecystitis in his mother; Lung disease in his father.  ROS:   Please see the history of present illness.     All other systems reviewed and are negative.   Prior CV studies:   The following studies were reviewed today:  Ca Score 07/07/20 FINDINGS: Non-cardiac: See separate report from [facility].  Ascending Aorta: Normal Caliber.  No calcifications.  Pericardium: Normal  Coronary arteries: Normal coronary origins.  IMPRESSION: Coronary calcium score of 987. This was 93rd percentile for age and sex matched control.  FINDINGS: Within the visualized portions of the thorax there are no suspicious appearing pulmonary nodules or masses, there is no acute consolidative airspace disease, no pleural effusions, no pneumothorax and no lymphadenopathy. Small left-sided Bochdalek's hernia incidentally noted. Visualized portions of the upper abdomen are unremarkable. Multiple old healed right-sided rib fractures. There are no aggressive appearing lytic or blastic lesions noted in the visualized portions of the skeleton.  IMPRESSION: 1. No significant incidental noncardiac findings are noted.    Labs/Other Tests and Data Reviewed:    EKG:  An ECG dated 06/25/20 was personally reviewed today and demonstrated:  NSR 74bpm RBBB prior inferior infarct  Recent Labs: No results found for requested labs within last 8760 hours.   Recent Lipid Panel No results found for: CHOL, TRIG, HDL, CHOLHDL, LDLCALC, LDLDIRECT  Wt Readings from Last 3 Encounters:    07/14/20 234 lb (106.1 kg)  07/07/20 234 lb (106.1 kg)  06/25/20 234 lb (106.1 kg)     Objective:    Vital Signs:  BP (!) 168/89   Pulse 89   Ht 5\' 6"  (1.676 m)   Wt 234 lb (106.1 kg)   BMI 37.77 kg/m    VS reviewed. General - calm M in no acute distress Pulm - No labored breathing, no coughing during visit, no audible wheezing, speaking in full sentences Neuro - A+Ox3, no slurred speech, answers questions appropriately Psych - Pleasant affect  ASSESSMENT & PLAN:    1. Abnormal stress test - Dr. Radford Pax is recommending cardiac cath given his abnormal stress test and elevated calcium score. Risks and benefits of cardiac catheterization have been discussed with the patient.  These include bleeding, infection, kidney damage, stroke, heart attack, death.  The patient understands these risks and is willing to proceed. We will arrange. Would avoid LV gram given CKD and known EF from stress test. Would recommend radial stick if possible given patient's refusal of blood products. Given suspected CKD, we will bring him in earlier than usual for pre-cath hydration the day of his procedure and hold ARB. Will have him come in for pre-cath labs including  CMET, CBC, fasting lipid profile. Continue ASA and statin. See remarks below otherwise. Warning sx/ER precautions also reviewed.  2. Coronary calcification by Ca score - plan as outlined above. I anticipate he would benefit from statin titration as well, but would like to see labs first as above.  3. Essential HTN - BPs were similarly elevated during stress test visit recently. He was 132/70 at the [place] with Dr. Radford Pax. Will add low dose carvedilol 3.125mg  BID to start and continue losartan for now. I chose not to titrate the dose of losartan due to his CKD and upcoming cath.  4. Renal insufficiency of unknown duration - will obtain updated labs today.   Time:   Today, I have spent 17 minutes with the patient with telehealth technology discussing  the above problems.     Medication Adjustments/Labs and Tests Ordered: Current medicines are reviewed at length with the patient today.  Testing and concerns regarding medicines are outlined above.    Follow Up:  2 weeks in person post-cath with Dr. Radford Pax or APP  Signed, Charlie Pitter, PA-C  07/14/2020 10:55 AM    [place]

## 2020-07-14 ENCOUNTER — Other Ambulatory Visit: Payer: Self-pay

## 2020-07-14 ENCOUNTER — Telehealth (INDEPENDENT_AMBULATORY_CARE_PROVIDER_SITE_OTHER): Payer: Medicare Other | Admitting: Physician Assistant

## 2020-07-14 ENCOUNTER — Telehealth: Payer: Self-pay | Admitting: *Deleted

## 2020-07-14 ENCOUNTER — Encounter: Payer: Self-pay | Admitting: Physician Assistant

## 2020-07-14 VITALS — BP 168/89 | HR 89 | Ht 66.0 in | Wt 234.0 lb

## 2020-07-14 DIAGNOSIS — I2510 Atherosclerotic heart disease of native coronary artery without angina pectoris: Secondary | ICD-10-CM

## 2020-07-14 DIAGNOSIS — R079 Chest pain, unspecified: Secondary | ICD-10-CM

## 2020-07-14 DIAGNOSIS — R9439 Abnormal result of other cardiovascular function study: Secondary | ICD-10-CM

## 2020-07-14 DIAGNOSIS — I2584 Coronary atherosclerosis due to calcified coronary lesion: Secondary | ICD-10-CM

## 2020-07-14 DIAGNOSIS — I10 Essential (primary) hypertension: Secondary | ICD-10-CM

## 2020-07-14 DIAGNOSIS — N289 Disorder of kidney and ureter, unspecified: Secondary | ICD-10-CM

## 2020-07-14 MED ORDER — SODIUM CHLORIDE 0.9% FLUSH: 3.0000 mL | Freq: Two times a day (BID) | INTRAVENOUS | Status: DC

## 2020-07-14 MED ORDER — CARVEDILOL 3.125 MG PO TABS: 3.1250 mg | ORAL_TABLET | Freq: Two times a day (BID) | ORAL | 1 refills | Status: DC

## 2020-07-14 NOTE — Telephone Encounter (Signed)
  Patient Consent for Virtual Visit         Juan Hatfield has provided verbal consent on 07/14/2020 for a virtual visit (video or telephone).   CONSENT FOR VIRTUAL VISIT FOR:  Juan Hatfield  By participating in this virtual visit I agree to the following:  I hereby voluntarily request, consent and authorize [facility] and its employed or contracted physicians, physician assistants, nurse practitioners or other licensed health care professionals (the Practitioner), to provide me with telemedicine health care services (the "Services") as deemed necessary by the treating Practitioner. I acknowledge and consent to receive the Services by the Practitioner via telemedicine. I understand that the telemedicine visit will involve communicating with the Practitioner through live audiovisual communication technology and the disclosure of certain medical information by electronic transmission. I acknowledge that I have been given the opportunity to request an in-person assessment or other available alternative prior to the telemedicine visit and am voluntarily participating in the telemedicine visit.  I understand that I have the right to withhold or withdraw my consent to the use of telemedicine in the course of my care at any time, without affecting my right to future care or treatment, and that the Practitioner or I may terminate the telemedicine visit at any time. I understand that I have the right to inspect all information obtained and/or recorded in the course of the telemedicine visit and may receive copies of available information for a reasonable fee.  I understand that some of the potential risks of receiving the Services via telemedicine include:  Marland Kitchen Delay or interruption in medical evaluation due to technological equipment failure or disruption; . Information transmitted may not be sufficient (e.g. poor resolution of images) to allow for appropriate medical decision making by the  Practitioner; and/or  . In rare instances, security protocols could fail, causing a breach of personal health information.  Furthermore, I acknowledge that it is my responsibility to provide information about my medical history, conditions and care that is complete and accurate to the best of my ability. I acknowledge that Practitioner's advice, recommendations, and/or decision may be based on factors not within their control, such as incomplete or inaccurate data provided by me or distortions of diagnostic images or specimens that may result from electronic transmissions. I understand that the practice of medicine is not an exact science and that Practitioner makes no warranties or guarantees regarding treatment outcomes. I acknowledge that a copy of this consent can be made available to me via my patient portal ([facility]), or I can request a printed copy by calling the office of [facility].    I understand that my insurance will be billed for this visit.   I have read or had this consent read to me. . I understand the contents of this consent, which adequately explains the benefits and risks of the Services being provided via telemedicine.  . I have been provided ample opportunity to ask questions regarding this consent and the Services and have had my questions answered to my satisfaction. . I give my informed consent for the services to be provided through the use of telemedicine in my medical care

## 2020-07-14 NOTE — Patient Instructions (Addendum)
Medication Instructions:  Your physician has recommended you make the following change in your medication:  1.  START Coreg (Carvedilol) 3.125 mg taking 1 tablet twice a day  *If you need a refill on your cardiac medications before your next appointment, please call your pharmacy*   Lab Work: 12/1:  FASTING LIPID, CMET, & CBC  If you have labs (blood work) drawn today and your tests are completely normal, you will receive your results only by: Marland Kitchen MyChart Message (if you have MyChart) OR . A paper copy in the mail If you have any lab test that is abnormal or we need to change your treatment, we will call you to review the results.   Testing/Procedures: Your physician has requested that you have a cardiac catheterization. Cardiac catheterization is used to diagnose and/or treat various heart conditions. Doctors may recommend this procedure for a number of different reasons. The most common reason is to evaluate chest pain. Chest pain can be a symptom of coronary artery disease (CAD), and cardiac catheterization can show whether plaque is narrowing or blocking your heart's arteries. This procedure is also used to evaluate the valves, as well as measure the blood flow and oxygen levels in different parts of your heart. For further information please visit [email or web address]. Please follow instruction sheet, BELOW:     [facility] [place], [place] [address] Dept: [phone] Loc: [facility]  07/14/2020  You are scheduled for a Cardiac Catheterization on Monday, December 6 with Dr. Quay Burow.  1. Please arrive at the [facility] (Main Entrance A) at [facility]: [address] [place], [place] [address] at 9:30 AM (This time is two hours before your procedure to ensure your preparation). Free valet parking service is available.   Special note: Every  effort is made to have your procedure done on time. Please understand that emergencies sometimes delay scheduled procedures.  2. Diet: Do not eat solid foods after midnight.  The patient may have clear liquids until 5am upon the day of the procedure.  3. Labs: You will need to have blood drawn on Wednesday, December 1 at [facility] at [facility]. [address]  Open: 7:30am - 5pm    Phone: [phone]. You do not need to be fasting.  4. Medication instructions in preparation for your procedure:   Contrast Allergy: No     Due to recent COVID-19 restrictions implemented by our local and state authorities and in an effort to keep both patients and staff as safe as possible, our hospital system requires COVID-19 testing prior to certain scheduled hospital procedures.  Please go to [facility]. [place],  [id number] on 07/18/20 at 11:55 am  .  This is a drive up testing site.  You will not need to exit your vehicle.  You will not be billed at the time of testing but may receive a bill later depending on your insurance. You must agree to self-quarantine from the time of your testing until the procedure date on 07/20/20.  This should included staying home with ONLY the people you live with.  Avoid take-out, grocery store shopping or leaving the house for any non-emergent reason.  Failure to have your COVID-19 test done on the date and time you have been scheduled will result in cancellation of your procedure.  Please call our office at [phone] if you  have any questions.   Take only 15 units of insulin the night before your procedure. Do not take any insulin on the day of the procedure.  Do not take Diabetes Med Janumet (Metformin + Sitagliptin) on the day of the procedure and HOLD 48 HOURS AFTER THE PROCEDURE.  On the morning of your procedure, take your Aspirin and any morning medicines NOT listed above.  You may use sips of water.  5. Plan for one night  stay--bring personal belongings. 6. Bring a current list of your medications and current insurance cards. 7. You MUST have a responsible person to drive you home. 8. Someone MUST be with you the first 24 hours after you arrive home or your discharge will be delayed. 9. Please wear clothes that are easy to get on and off and wear slip-on shoes.  Thank you for allowing [place] to care for you!   -- [facility]    Follow-Up: At [facility], you and your health needs are our priority.  As part of our continuing mission to provide you with exceptional heart care, we have created designated Provider Care Teams.  These Care Teams include your primary Cardiologist (physician) and Advanced Practice Providers (APPs -  Physician Assistants and Nurse Practitioners) who all work together to provide you with the care you need, when you need it.  We recommend signing up for the patient portal called "MyChart".  Sign up information is provided on this After Visit Summary.  MyChart is used to connect with patients for Virtual Visits (Telemedicine).  Patients are able to view lab/test results, encounter notes, upcoming appointments, etc.  Non-urgent messages can be sent to your provider as well.   To learn more about what you can do with MyChart, go to [email or web address].    Your next appointment:   2 week(s)  The format for your next appointment:   In Person  Provider:   You may see Fransico Him, MD or one of the following Advanced Practice Providers on your designated Care Team:    Melina Copa, PA-C  Ermalinda Barrios, PA-C   Other Instructions

## 2020-07-14 NOTE — Addendum Note (Signed)
Addended by: Charlie Pitter on: 07/14/2020 12:55 PM   Modules accepted: Orders, SmartSet

## 2020-07-15 ENCOUNTER — Other Ambulatory Visit: Payer: Medicare Other

## 2020-07-15 ENCOUNTER — Other Ambulatory Visit: Payer: Self-pay

## 2020-07-15 DIAGNOSIS — I10 Essential (primary) hypertension: Secondary | ICD-10-CM

## 2020-07-15 DIAGNOSIS — R9439 Abnormal result of other cardiovascular function study: Secondary | ICD-10-CM

## 2020-07-15 DIAGNOSIS — I2510 Atherosclerotic heart disease of native coronary artery without angina pectoris: Secondary | ICD-10-CM

## 2020-07-15 DIAGNOSIS — N289 Disorder of kidney and ureter, unspecified: Secondary | ICD-10-CM

## 2020-07-15 DIAGNOSIS — R079 Chest pain, unspecified: Secondary | ICD-10-CM

## 2020-07-15 LAB — LIPID PANEL
Chol/HDL Ratio: 1.6 ratio (ref 0.0–5.0)
Cholesterol, Total: 77 mg/dL — ABNORMAL LOW (ref 100–199)
HDL: 47 mg/dL (ref >39)
LDL Chol Calc (NIH): 13 mg/dL (ref 0–99)
Triglycerides: 82 mg/dL (ref 0–149)
VLDL Cholesterol Cal: 17 mg/dL (ref 5–40)

## 2020-07-15 LAB — CBC
Hematocrit: 45.5 % (ref 37.5–51.0)
Hemoglobin: 14.7 g/dL (ref 13.0–17.7)
MCH: 28.3 pg (ref 26.6–33.0)
MCHC: 32.3 g/dL (ref 31.5–35.7)
MCV: 88 fL (ref 79–97)
Platelets: 253 10*3/uL (ref 150–450)
RBC: 5.19 x10E6/uL (ref 4.14–5.80)
RDW: 10.9 % — ABNORMAL LOW (ref 11.6–15.4)
WBC: 7.4 10*3/uL (ref 3.4–10.8)

## 2020-07-15 LAB — COMPREHENSIVE METABOLIC PANEL
ALT: 19 IU/L (ref 0–44)
AST: 16 IU/L (ref 0–40)
Albumin/Globulin Ratio: 1.7 (ref 1.2–2.2)
Albumin: 4.4 g/dL (ref 3.7–4.7)
Alkaline Phosphatase: 99 IU/L (ref 44–121)
BUN/Creatinine Ratio: 11 (ref 10–24)
BUN: 18 mg/dL (ref 8–27)
Bilirubin Total: 0.4 mg/dL (ref 0.0–1.2)
CO2: 30 mmol/L — ABNORMAL HIGH (ref 20–29)
Calcium: 10.5 mg/dL — ABNORMAL HIGH (ref 8.6–10.2)
Chloride: 99 mmol/L (ref 96–106)
Creatinine, Ser: 1.6 mg/dL — ABNORMAL HIGH (ref 0.76–1.27)
GFR calc Af Amer: 49 mL/min/{1.73_m2} — ABNORMAL LOW (ref >59)
GFR calc non Af Amer: 42 mL/min/{1.73_m2} — ABNORMAL LOW (ref >59)
Globulin, Total: 2.6 g/dL (ref 1.5–4.5)
Glucose: 156 mg/dL — ABNORMAL HIGH (ref 65–99)
Potassium: 4.8 mmol/L (ref 3.5–5.2)
Sodium: 140 mmol/L (ref 134–144)
Total Protein: 7 g/dL (ref 6.0–8.5)

## 2020-07-16 ENCOUNTER — Telehealth: Payer: Self-pay | Admitting: *Deleted

## 2020-07-16 NOTE — Telephone Encounter (Signed)
Pt contacted pre-catheterization scheduled at [facility] for: Monday July 20, 2020 11:30 AM Verified arrival time and place: [facility]) at: 6:30 AM-pre-procedure hydration   No solid food after midnight prior to cath, clear liquids until 5 AM day of procedure.  Hold: Losartan-day before and day of procedure-GFR 49 Insulin-AM of procedure Janumet-AM of procedure   Except hold medications AM meds can be  taken pre-cath with sips of water including: ASA 81 mg   Confirmed patient has responsible adult to drive home post procedure and be with patient first 24 hours after arriving home: yes  You are allowed ONE visitor in the waiting room during the time you are at the hospital for your procedure. Both you and your visitor must wear a mask once you enter the hospital.       COVID-19 Pre-Screening Questions:  . In the past 14 days have you had any symptoms concerning for COVID-19 infection (fever, chills, cough, or new shortness of breath)? no . In the past 14 days have you been around anyone with known Covid 19? no   Reviewed procedure/mask/visitor instructions, COVID-19 questions with patient.

## 2020-07-18 ENCOUNTER — Other Ambulatory Visit (HOSPITAL_COMMUNITY)
Admission: RE | Admit: 2020-07-18 | Discharge: 2020-07-18 | Disposition: A | Discharge status: Home / Self Care | Payer: Medicare Other | Source: Ambulatory Visit | Attending: Cardiovascular Disease | Admitting: Cardiovascular Disease

## 2020-07-18 DIAGNOSIS — Z20822 Contact with and (suspected) exposure to covid-19: Secondary | ICD-10-CM | POA: Insufficient documentation

## 2020-07-18 DIAGNOSIS — Z01812 Encounter for preprocedural laboratory examination: Secondary | ICD-10-CM | POA: Insufficient documentation

## 2020-07-19 LAB — SARS CORONAVIRUS 2 (TAT 6-24 HRS): SARS Coronavirus 2: NEGATIVE

## 2020-07-20 ENCOUNTER — Other Ambulatory Visit (HOSPITAL_COMMUNITY): Payer: Self-pay | Admitting: Physician Assistant

## 2020-07-20 ENCOUNTER — Ambulatory Visit (HOSPITAL_COMMUNITY)
Admission: RE | Admit: 2020-07-20 | Discharge: 2020-07-20 | Disposition: A | Discharge status: Home / Self Care | Payer: Medicare Other | Attending: Cardiovascular Disease | Admitting: Cardiovascular Disease

## 2020-07-20 ENCOUNTER — Encounter (HOSPITAL_COMMUNITY)
Admission: RE | Disposition: A | Discharge status: Home / Self Care | Payer: Self-pay | Source: Home / Self Care | Attending: Cardiovascular Disease

## 2020-07-20 DIAGNOSIS — R9439 Abnormal result of other cardiovascular function study: Secondary | ICD-10-CM | POA: Diagnosis present

## 2020-07-20 DIAGNOSIS — Z8249 Family history of ischemic heart disease and other diseases of the circulatory system: Secondary | ICD-10-CM | POA: Insufficient documentation

## 2020-07-20 DIAGNOSIS — E1122 Type 2 diabetes mellitus with diabetic chronic kidney disease: Secondary | ICD-10-CM | POA: Diagnosis not present

## 2020-07-20 DIAGNOSIS — N189 Chronic kidney disease, unspecified: Secondary | ICD-10-CM | POA: Insufficient documentation

## 2020-07-20 DIAGNOSIS — I129 Hypertensive chronic kidney disease with stage 1 through stage 4 chronic kidney disease, or unspecified chronic kidney disease: Secondary | ICD-10-CM | POA: Insufficient documentation

## 2020-07-20 DIAGNOSIS — Z79899 Other long term (current) drug therapy: Secondary | ICD-10-CM | POA: Diagnosis not present

## 2020-07-20 DIAGNOSIS — R079 Chest pain, unspecified: Secondary | ICD-10-CM

## 2020-07-20 DIAGNOSIS — I2510 Atherosclerotic heart disease of native coronary artery without angina pectoris: Secondary | ICD-10-CM | POA: Insufficient documentation

## 2020-07-20 DIAGNOSIS — Z794 Long term (current) use of insulin: Secondary | ICD-10-CM | POA: Diagnosis not present

## 2020-07-20 DIAGNOSIS — Z7982 Long term (current) use of aspirin: Secondary | ICD-10-CM | POA: Insufficient documentation

## 2020-07-20 HISTORY — PX: LEFT HEART CATH AND CORONARY ANGIOGRAPHY: CATH118249

## 2020-07-20 LAB — GLUCOSE, CAPILLARY
Glucose-Capillary: 234 mg/dL — ABNORMAL HIGH (ref 70–99)
Glucose-Capillary: 127 mg/dL — ABNORMAL HIGH (ref 70–99)

## 2020-07-20 SURGERY — LEFT HEART CATH AND CORONARY ANGIOGRAPHY
Anesthesia: LOCAL

## 2020-07-20 MED ORDER — NITROGLYCERIN 0.4 MG SL SUBL: 0.4000 mg | SUBLINGUAL_TABLET | SUBLINGUAL | 0 refills | Status: DC | PRN

## 2020-07-20 MED ORDER — SODIUM CHLORIDE 0.9 % WEIGHT BASED INFUSION
3.0000 mL/kg/h | INTRAVENOUS | Status: DC
  Administered 2020-07-20: 3 mL/kg/h via INTRAVENOUS

## 2020-07-20 MED ORDER — LABETALOL HCL 5 MG/ML IV SOLN: 10.0000 mg | INTRAVENOUS | Status: DC | PRN

## 2020-07-20 MED ORDER — MIDAZOLAM HCL 2 MG/2ML IJ SOLN
INTRAMUSCULAR | Status: DC | PRN
  Administered 2020-07-20: 1 mg via INTRAVENOUS

## 2020-07-20 MED ORDER — SODIUM CHLORIDE 0.9% FLUSH: 3.0000 mL | INTRAVENOUS | Status: DC | PRN

## 2020-07-20 MED ORDER — HEPARIN SODIUM (PORCINE) 1000 UNIT/ML IJ SOLN
INTRAMUSCULAR | Status: AC
  Filled 2020-07-20: qty 1

## 2020-07-20 MED ORDER — ONDANSETRON HCL 4 MG/2ML IJ SOLN: 4.0000 mg | Freq: Four times a day (QID) | INTRAMUSCULAR | Status: DC | PRN

## 2020-07-20 MED ORDER — HYDRALAZINE HCL 20 MG/ML IJ SOLN: 10.0000 mg | INTRAMUSCULAR | Status: DC | PRN

## 2020-07-20 MED ORDER — HEPARIN (PORCINE) IN NACL 1000-0.9 UT/500ML-% IV SOLN
INTRAVENOUS | Status: AC
  Filled 2020-07-20: qty 1000

## 2020-07-20 MED ORDER — MORPHINE SULFATE (PF) 2 MG/ML IV SOLN: 2.0000 mg | INTRAVENOUS | Status: DC | PRN

## 2020-07-20 MED ORDER — HEPARIN (PORCINE) IN NACL 1000-0.9 UT/500ML-% IV SOLN
INTRAVENOUS | Status: DC | PRN
  Administered 2020-07-20 (×2): 500 mL

## 2020-07-20 MED ORDER — NITROGLYCERIN 1 MG/10 ML FOR IR/CATH LAB
INTRA_ARTERIAL | Status: AC
  Filled 2020-07-20: qty 10

## 2020-07-20 MED ORDER — SODIUM CHLORIDE 0.9 % IV SOLN: 250.0000 mL | INTRAVENOUS | Status: DC | PRN

## 2020-07-20 MED ORDER — NITROGLYCERIN 0.4 MG SL SUBL: 0.4000 mg | SUBLINGUAL_TABLET | SUBLINGUAL | 3 refills | Status: DC | PRN

## 2020-07-20 MED ORDER — SODIUM CHLORIDE 0.9% FLUSH: 3.0000 mL | Freq: Two times a day (BID) | INTRAVENOUS | Status: DC

## 2020-07-20 MED ORDER — VERAPAMIL HCL 2.5 MG/ML IV SOLN
INTRAVENOUS | Status: AC
  Filled 2020-07-20: qty 2

## 2020-07-20 MED ORDER — LIDOCAINE HCL (PF) 1 % IJ SOLN
INTRAMUSCULAR | Status: DC | PRN
  Administered 2020-07-20: 2 mL

## 2020-07-20 MED ORDER — IOHEXOL 350 MG/ML SOLN
INTRAVENOUS | Status: DC | PRN
  Administered 2020-07-20: 60 mL via INTRACARDIAC

## 2020-07-20 MED ORDER — FENTANYL CITRATE (PF) 100 MCG/2ML IJ SOLN
INTRAMUSCULAR | Status: DC | PRN
  Administered 2020-07-20: 25 ug via INTRAVENOUS

## 2020-07-20 MED ORDER — VERAPAMIL HCL 2.5 MG/ML IV SOLN
INTRA_ARTERIAL | Status: DC | PRN
  Administered 2020-07-20: 5 mL via INTRA_ARTERIAL

## 2020-07-20 MED ORDER — SODIUM CHLORIDE 0.9 % WEIGHT BASED INFUSION: 1.0000 mL/kg/h | INTRAVENOUS | Status: DC

## 2020-07-20 MED ORDER — ACETAMINOPHEN 325 MG PO TABS: 650.0000 mg | ORAL_TABLET | ORAL | Status: DC | PRN

## 2020-07-20 MED ORDER — ISOSORBIDE MONONITRATE ER 30 MG PO TB24: 30.0000 mg | ORAL_TABLET | Freq: Every day | ORAL | 6 refills | Status: DC

## 2020-07-20 MED ORDER — MIDAZOLAM HCL 2 MG/2ML IJ SOLN
INTRAMUSCULAR | Status: AC
  Filled 2020-07-20: qty 2

## 2020-07-20 MED ORDER — HEPARIN SODIUM (PORCINE) 1000 UNIT/ML IJ SOLN
INTRAMUSCULAR | Status: DC | PRN
  Administered 2020-07-20: 5000 [IU] via INTRAVENOUS

## 2020-07-20 MED ORDER — ASPIRIN 81 MG PO CHEW: 81.0000 mg | CHEWABLE_TABLET | Freq: Every day | ORAL | Status: DC

## 2020-07-20 MED ORDER — SODIUM CHLORIDE 0.9 % IV SOLN: INTRAVENOUS | Status: DC

## 2020-07-20 MED ORDER — ISOSORBIDE MONONITRATE ER 30 MG PO TB24: 30.0000 mg | ORAL_TABLET | Freq: Every day | ORAL | 0 refills | Status: DC

## 2020-07-20 MED ORDER — LIDOCAINE HCL (PF) 1 % IJ SOLN
INTRAMUSCULAR | Status: AC
  Filled 2020-07-20: qty 30

## 2020-07-20 MED ORDER — FENTANYL CITRATE (PF) 100 MCG/2ML IJ SOLN
INTRAMUSCULAR | Status: AC
  Filled 2020-07-20: qty 2

## 2020-07-20 MED ORDER — ASPIRIN 81 MG PO CHEW: 81.0000 mg | CHEWABLE_TABLET | ORAL | Status: DC

## 2020-07-20 MED ORDER — ATORVASTATIN CALCIUM 80 MG PO TABS
80.0000 mg | ORAL_TABLET | Freq: Every day | ORAL | Status: DC
  Filled 2020-07-20: qty 1

## 2020-07-20 MED FILL — NITROGLYCERIN 0.4 MG TAB SL: 0.4 | 8 days supply | Qty: 25 | Fill #0

## 2020-07-20 MED FILL — ISOSORBIDE MN ER 30 MG TAB: 30 | 30 days supply | Qty: 30 | Fill #0

## 2020-07-20 SURGICAL SUPPLY — 12 items
CATH INFINITI 5FR ANG PIGTAIL (CATHETERS) ×1 IMPLANT
CATH OPTITORQUE TIG 4.0 5F (CATHETERS) ×1 IMPLANT
DEVICE RAD COMP TR BAND LRG (VASCULAR PRODUCTS) ×1 IMPLANT
GLIDESHEATH SLEND A-KIT 6F 22G (SHEATH) ×1 IMPLANT
GUIDEWIRE INQWIRE 1.5J.035X260 (WIRE) IMPLANT
INQWIRE 1.5J .035X260CM (WIRE) ×2
KIT HEART LEFT (KITS) ×2 IMPLANT
PACK CARDIAC CATHETERIZATION (CUSTOM PROCEDURE TRAY) ×2 IMPLANT
SYR MEDRAD MARK 7 150ML (SYRINGE) ×2 IMPLANT
TRANSDUCER W/STOPCOCK (MISCELLANEOUS) ×2 IMPLANT
TUBING CIL FLEX 10 FLL-RA (TUBING) ×2 IMPLANT
WIRE HI TORQ VERSACORE-J 145CM (WIRE) ×1 IMPLANT

## 2020-07-20 NOTE — Progress Notes (Addendum)
Dr. Gwenlyn Found called me to review patient's cath report. He recommends starting Imdur 30mg  daily initially as part of medical therapy. Will also give him rx for SL NTG. He elected to use [facility] pharmacy to deliver to bedside so sent in rxs to [facility] here as well as patient's usual pharmacy with refills. Dr. Gwenlyn Found only used 60cc contrast so he feels he feels this patient can have repeat BMET when he comes back in for his follow-up appointment on 12/20 - can be ordered at that visit. Also given instructions to hold Janumet for at least 48 hours post-procedure, to restart 12/9.  Lora Glomski PA-C

## 2020-07-20 NOTE — Discharge Instructions (Signed)
Do not take any Janumet for 48 hours after your heart cath - you can restart this on 07/23/20     Radial Site Care  This sheet gives you information about how to care for yourself after your procedure. Your health care provider may also give you more specific instructions. If you have problems or questions, contact your health care provider. What can I expect after the procedure? After the procedure, it is common to have:  Bruising and tenderness at the catheter insertion area. Follow these instructions at home: Medicines  Take over-the-counter and prescription medicines only as told by your health care provider. Insertion site care 1. Follow instructions from your health care provider about how to take care of your insertion site. Make sure you: ? Wash your hands with soap and water before you change your bandage (dressing). If soap and water are not available, use hand sanitizer. ? Change your dressing as told by your health care provider. 2. Check your insertion site every day for signs of infection. Check for: ? Redness, swelling, or pain. ? Fluid or blood. ? Pus or a bad smell. ? Warmth. 3. Do not take baths, swim, or use a hot tub for 5 days. 4. You may shower 24-48 hours after the procedure. ? Remove the dressing and gently wash the site with plain soap and water. ? Pat the area dry with a clean towel. ? Do not rub the site. That could cause bleeding. 5. Do not apply powder or lotion to the site. Activity  1. For 24 hours after the procedure, or as directed by your health care provider: ? Do not flex or bend the affected arm. ? Do not push or pull heavy objects with the affected arm. ? Do not drive yourself home from the hospital or clinic. You may drive 24 hours after the procedure. ? Do not operate machinery or power tools. ? KEEP ARM ELEVATED THE REMAINDER OF THE DAY. 2. Do not push, pull or lift anything that is heavier than 10 lb for 5 days. 3. Ask your health  care provider when it is okay to: ? Return to work or school. ? Resume usual physical activities or sports. ? Resume sexual activity. General instructions  If the catheter site starts to bleed, raise your arm and put firm pressure on the site. If the bleeding does not stop, get help right away. This is a medical emergency.  DRINK PLENTY OF FLUIDS FOR THE NEXT 2-3 DAYS.  If you went home on the same day as your procedure, a responsible adult should be with you for the first 24 hours after you arrive home.  Keep all follow-up visits as told by your health care provider. This is important. Contact a health care provider if:  You have a fever.  You have redness, swelling, or yellow drainage around your insertion site. Get help right away if:  You have unusual pain at the radial site.  The catheter insertion area swells very fast.  The insertion area is bleeding, and the bleeding does not stop when you hold steady pressure on the area.  Your arm or hand becomes pale, cool, tingly, or numb. These symptoms may represent a serious problem that is an emergency. Do not wait to see if the symptoms will go away. Get medical help right away. Call your local emergency services (911 in the [place] Do not drive yourself to the hospital. Summary  After the procedure, it is common to have bruising and  tenderness at the site.  Follow instructions from your health care provider about how to take care of your radial site wound. Check the wound every day for signs of infection.  Do not push, pull or lift anything that is heavier than 10 lb for 5 days.  This information is not intended to replace advice given to you by your health care provider. Make sure you discuss any questions you have with your health care provider. Document Revised: 09/06/2017 Document Reviewed: 09/06/2017 Elsevier Patient Education  2020 [employer].

## 2020-07-20 NOTE — Progress Notes (Addendum)
CARDIOLOGY OFFICE NOTE  Date:  08/03/2020    Juan Hatfield Date of Birth: September 24, 1945 Medical Record #[id number]  PCP:  No primary care provider on file.  Cardiologist:  Radford Pax  Chief Complaint  Patient presents with  . Hospitalization Follow-up    Post cath visit - seen for Dr. Radford Pax    History of Present Illness: Juan Hatfield is a 74 y.o. male who presents today for a post cath visit. Seen for Dr. Radford Pax.   He has a history of known DM, HTN, ?renal insufficiency (Cr 1.67 by labs 2020), RBBB, and refusal of blood products - he is a Sales promotion account executive witness.   Seen by Dr. Radford Pax in early November with vague chest pain. Calcium score elevated. Nuclear study abnormal. Referred for cardiac cath which was set up by virtual visit with Melina Copa, PA.   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 Placed on Imdur - to consider CABG if symptoms persist.   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   Comes in today. Here with his daughter. She augments the history. He is doing ok. Has not really returned to his usual activities - he cuts/splits wood, blows leaves, rides his bike. He has not had anymore chest discomfort - was a pressure like sensation - he wonders if it was indigestion - it did occur with walking however. He is diabetic. BP just fair.   Past Medical History:  Diagnosis Date  . 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 Diabetes mellitus ([place])   . Hypertension   . Renal insufficiency     Past Surgical History:  Procedure Laterality Date  . COLONOSCOPY    . COLONOSCOPY WITH PROPOFOL N/A 04/02/2019   Procedure: COLONOSCOPY WITH PROPOFOL;  Surgeon: Wilford Corner, MD;  Location: [facility];  Service: Endoscopy;  Laterality: N/A;  . LEFT HEART CATH AND CORONARY ANGIOGRAPHY N/A 07/20/2020   Procedure: LEFT HEART CATH AND CORONARY ANGIOGRAPHY;  Surgeon: Lorretta Harp, MD;  Location: [place] [facility];   Service: Cardiovascular;  Laterality: N/A;  . LUMBAR LAMINECTOMY/DECOMPRESSION MICRODISCECTOMY Right 01/01/2015   Procedure: LUMBAR LAMINECTOMY/DECOMPRESSION MICRODISCECTOMY RIGHT LUMBAR FIVE SACRAL ONE;  Surgeon: Jovita Gamma, MD;  Location: [place] [facility];  Service: Neurosurgery;  Laterality: Right;  . POLYPECTOMY  04/02/2019   Procedure: POLYPECTOMY;  Surgeon: Wilford Corner, MD;  [facility];  Service: Endoscopy;;     Medications: Current Meds  Medication Sig  . aspirin 81 MG tablet Take 81 mg by mouth daily.  . carvedilol (COREG) 3.125 MG tablet Take 1 tablet (3.125 mg total) by mouth 2 (two) times daily.  . Coenzyme Q10 (CO Q 10) 100 MG CAPS Take 100 mg by mouth daily.   . isosorbide mononitrate (IMDUR) 30 MG 24 hr tablet Take 1 tablet (30 mg total) by mouth daily.  Marland Kitchen LEVEMIR FLEXTOUCH 100 UNIT/ML FlexPen Inject 30 Units into the skin daily.   Marland Kitchen losartan (COZAAR) 25 MG tablet Take 25 mg by mouth daily.  . Magnesium 250 MG TABS Take 250 mg by mouth daily.  . milk thistle 175 MG tablet Take 175 mg by mouth daily.  . Multiple Vitamins-Minerals (ALIVE ENERGY 50+) TABS Take 1 tablet by mouth daily.  . Multiple Vitamins-Minerals (MULTIVITAMIN ADULT PO) Take 1 tablet by mouth daily.  . nitroGLYCERIN (NITROSTAT) 0.4 MG SL tablet Place 1 tablet (0.4 mg total) under the tongue every 5 (five) minutes as needed  for chest pain (up to 3 doses).  . Omega-3 Fatty Acids (FISH OIL) 1000 MG CAPS Take 1,000 mg by mouth daily.  . rosuvastatin (CRESTOR) 5 MG tablet Take 5 mg by mouth daily.  . sitaGLIPtin-metformin (JANUMET) 50-1000 MG per tablet Take 1 tablet by mouth 2 (two) times daily with a meal.   Current Facility-Administered Medications for the 08/03/20 encounter (Office Visit) with Burtis Junes, NP  Medication  . sodium chloride flush (NS) 0.9 % injection 3 mL     Allergies: Allergies  Allergen Reactions  . Other     No blood or blood products    Social History: The  patient  reports that he has never smoked. He has never used smokeless tobacco. He reports previous alcohol use. He reports that he does not use drugs.   Family History: The patient's family history includes CAD in his brother; Cholecystitis in his mother; Lung disease in his father.   Review of Systems: Please see the history of present illness.   All other systems are reviewed and negative.   Physical Exam: VS:  BP 140/78   Pulse 69   Ht 5\' 6"  (1.676 m)   Wt 234 lb 6.4 oz (106.3 kg)   SpO2 97%   BMI 37.83 kg/m  .  BMI Body mass index is 37.83 kg/m.  Wt Readings from Last 3 Encounters:  08/03/20 234 lb 6.4 oz (106.3 kg)  07/20/20 232 lb (105.2 kg)  07/14/20 234 lb (106.1 kg)    General: Pleasant. Alert and in no acute distress. He is obese.   Cardiac: Regular rate and rhythm. No murmurs, rubs, or gallops. No edema.  Respiratory:  Lungs are clear to auscultation bilaterally with normal work of breathing.  GI: Soft and nontender.  MS: No deformity or atrophy. Gait and ROM intact.  Skin: Warm and dry. Color is normal.  Neuro:  Strength and sensation are intact and no gross focal deficits noted.  Psych: Alert, appropriate and with normal affect.   LABORATORY DATA:  EKG:  EKG is not ordered today.    Lab Results  Component Value Date   WBC 7.4 07/15/2020   HGB 14.7 07/15/2020   HCT 45.5 07/15/2020   PLT 253 07/15/2020   GLUCOSE 156 (H) 07/15/2020   CHOL 77 (L) 07/15/2020   TRIG 82 07/15/2020   HDL 47 07/15/2020   LDLCALC 13 07/15/2020   ALT 19 07/15/2020   AST 16 07/15/2020   NA 140 07/15/2020   K 4.8 07/15/2020   CL 99 07/15/2020   CREATININE 1.60 (H) 07/15/2020   BUN 18 07/15/2020   CO2 30 (H) 07/15/2020   HGBA1C 6.6 (H) 12/29/2014     BNP (last 3 results) No results for input(s): BNP in the last 8760 hours.  ProBNP (last 3 results) No results for input(s): PROBNP in the last 8760 hours.   Other Studies Reviewed Today:  LEFT HEART CATH AND CORONARY  ANGIOGRAPHY 07/2020   Conclusion    Ost RCA to Prox RCA lesion is 100% stenosed.  1st Diag-1 lesion is 90% stenosed.  1st Diag-2 lesion is 95% stenosed.  Prox LAD to Mid LAD lesion is 90% stenosed.  Mid LAD lesion is 70% stenosed.  3rd Mrg lesion is 95% stenosed.  Ramus lesion is 90% stenosed.    Myoview Study Highlights 06/2020    Nuclear stress EF: 62%.  There was no ST segment deviation noted during stress.  No T wave inversion was noted during stress.  Defect 1: There is a medium defect of moderate severity present in the basal inferoseptal, basal inferior, mid inferior and apical inferior location.  The left ventricular ejection fraction is normal (55-65%).  This is an intermediate risk study.  Findings consistent with prior myocardial infarction with peri-infarct ischemia.   There is a mixed defect, medium size and moderate severity, in the inferior and inferoseptal walls. The basal inferior/inferoseptal wall demonstrates partial reversibility, while the mid to distal inferior wall is similar or slightly improved perfusion with stress. This is an intermediate risk test given the size of the defect.     Ca Score 07/07/20 FINDINGS: Non-cardiac: See separate report from [facility].   Ascending Aorta: Normal Caliber.  No calcifications.   Pericardium: Normal   Coronary arteries: Normal coronary origins.   IMPRESSION: Coronary calcium score of 987. This was 93rd percentile for age and sex matched control.   FINDINGS: Within the visualized portions of the thorax there are no suspicious appearing pulmonary nodules or masses, there is no acute consolidative airspace disease, no pleural effusions, no pneumothorax and no lymphadenopathy. Small left-sided Bochdalek's hernia incidentally noted. Visualized portions of the upper abdomen are unremarkable. Multiple old healed right-sided rib fractures. There are no aggressive appearing lytic or  blastic lesions noted in the visualized portions of the skeleton.   IMPRESSION: 1. No significant incidental noncardiac findings are noted.      ASSESSMENT & PLAN:     1. Chest pain - prior abnormal stress test - with 3VD noted on cath with preserved LV function. He has been placed on low dose Imdur. He has sl NTG on hand - has not had to use.  He has not had return of symptoms but has not resumed his activities yet. 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 Explained that the chest pressure he had with exertion is most likely in fact heart related. Reminded how to use sl NTG.   Overall situation tenuous in my opinion - may need to refer to [facility] for discussion. His most recent HGB and HCT are normal.   2. HTN - BP just fair. [place] lab today.   3. DM - per PCP  4. Obesity  5. HLD - has been started on statin.   6. CKD - recheck lab.   Current medicines are reviewed with the patient today.  The patient does not have concerns regarding medicines other than what has been noted above.  The following changes have been made:  See above.  Labs/ tests ordered today include:    Orders Placed This Encounter  Procedures  . Basic metabolic panel  . CBC     Disposition:   FU with Dr. Radford Pax in one month. Lab today.    Patient is agreeable to this plan and will call if any problems develop in the interim.   SignedTruitt Merle, NP  08/03/2020 11:40 AM  [facility] [address] [place], [place]  [address] Phone: [phone] Fax: [phone]       Addendum: This note was reviewed by Dr. Radford Pax - she has recommended referral to [facility] for discussion of CABG.   Appointment has been made with Dr. Kipp Brood for later this month noted.   Burtis Junes, RN, [facility] [address] [place], [place]  [address] [phone]

## 2020-07-20 NOTE — Interval H&P Note (Signed)
Cath Lab Visit (complete for each Cath Lab visit)  Clinical Evaluation Leading to the Procedure:   ACS: No.  Non-ACS:    Anginal Classification: No Symptoms  Anti-ischemic medical therapy: Minimal Therapy (1 class of medications)  Non-Invasive Test Results: Low-risk stress test findings: cardiac mortality <1%/year  Prior CABG: No previous CABG      History and Physical Interval Note:  07/20/2020 12:40 PM  Juan Hatfield  has presented today for surgery, with the diagnosis of abnormal stress test and ct.  The various methods of treatment have been discussed with the patient and family. After consideration of risks, benefits and other options for treatment, the patient has consented to  Procedure(s): LEFT HEART CATH AND CORONARY ANGIOGRAPHY (N/A) as a surgical intervention.  The patient's history has been reviewed, patient examined, no change in status, stable for surgery.  I have reviewed the patient's chart and labs.  Questions were answered to the patient's satisfaction.     Quay Burow

## 2020-07-20 NOTE — Progress Notes (Signed)
Discharge instructions reviewed with pt and his daughter ( via telephone) both voice understanding.  

## 2020-07-21 ENCOUNTER — Encounter (HOSPITAL_COMMUNITY): Payer: Self-pay | Admitting: Cardiovascular Disease

## 2020-08-03 ENCOUNTER — Other Ambulatory Visit: Payer: Self-pay

## 2020-08-03 ENCOUNTER — Encounter: Payer: Self-pay | Admitting: Nurse Practitioner

## 2020-08-03 ENCOUNTER — Ambulatory Visit: Payer: Medicare Other | Admitting: Nurse Practitioner

## 2020-08-03 VITALS — BP 140/78 | HR 69 | Ht 66.0 in | Wt 234.4 lb

## 2020-08-03 DIAGNOSIS — E119 Type 2 diabetes mellitus without complications: Secondary | ICD-10-CM

## 2020-08-03 DIAGNOSIS — I10 Essential (primary) hypertension: Secondary | ICD-10-CM | POA: Diagnosis not present

## 2020-08-03 DIAGNOSIS — I2584 Coronary atherosclerosis due to calcified coronary lesion: Secondary | ICD-10-CM

## 2020-08-03 DIAGNOSIS — N289 Disorder of kidney and ureter, unspecified: Secondary | ICD-10-CM

## 2020-08-03 DIAGNOSIS — I2510 Atherosclerotic heart disease of native coronary artery without angina pectoris: Secondary | ICD-10-CM

## 2020-08-03 DIAGNOSIS — Z9889 Other specified postprocedural states: Secondary | ICD-10-CM

## 2020-08-03 DIAGNOSIS — I259 Chronic ischemic heart disease, unspecified: Secondary | ICD-10-CM

## 2020-08-03 LAB — CBC
Hematocrit: 42.7 % (ref 37.5–51.0)
Hemoglobin: 13.6 g/dL (ref 13.0–17.7)
MCH: 28.8 pg (ref 26.6–33.0)
MCHC: 31.9 g/dL (ref 31.5–35.7)
MCV: 90 fL (ref 79–97)
Platelets: 236 10*3/uL (ref 150–450)
RBC: 4.73 x10E6/uL (ref 4.14–5.80)
RDW: 11.3 % — ABNORMAL LOW (ref 11.6–15.4)
WBC: 8.1 10*3/uL (ref 3.4–10.8)

## 2020-08-03 LAB — BASIC METABOLIC PANEL
BUN/Creatinine Ratio: 15 (ref 10–24)
BUN: 22 mg/dL (ref 8–27)
CO2: 28 mmol/L (ref 20–29)
Calcium: 9.9 mg/dL (ref 8.6–10.2)
Chloride: 101 mmol/L (ref 96–106)
Creatinine, Ser: 1.47 mg/dL — ABNORMAL HIGH (ref 0.76–1.27)
GFR calc Af Amer: 54 mL/min/{1.73_m2} — ABNORMAL LOW (ref >59)
GFR calc non Af Amer: 47 mL/min/{1.73_m2} — ABNORMAL LOW (ref >59)
Glucose: 148 mg/dL — ABNORMAL HIGH (ref 65–99)
Potassium: 4.6 mmol/L (ref 3.5–5.2)
Sodium: 142 mmol/L (ref 134–144)

## 2020-08-03 NOTE — Patient Instructions (Addendum)
After Visit Summary:  We will be checking the following labs today - BMET & CBC   Medication Instructions:    Continue with your current medicines.    If you need a refill on your cardiac medications before your next appointment, please call your pharmacy.     Testing/Procedures To Be Arranged:  N/A  Follow-Up:   See Dr. Radford Pax in one month    At [facility], you and your health needs are our priority.  As part of our continuing mission to provide you with exceptional heart care, we have created designated Provider Care Teams.  These Care Teams include your primary Cardiologist (physician) and Advanced Practice Providers (APPs -  Physician Assistants and Nurse Practitioners) who all work together to provide you with the care you need, when you need it.  Special Instructions:  . Stay safe, wash your hands for at least 20 seconds and wear a mask when needed.  . It was good to talk with you today.  Madaline Brilliant to gradually resume your activities   Call the [place] office at [phone] if you have any questions, problems or concerns.

## 2020-08-10 ENCOUNTER — Other Ambulatory Visit: Payer: Self-pay

## 2020-08-10 DIAGNOSIS — I2584 Coronary atherosclerosis due to calcified coronary lesion: Secondary | ICD-10-CM

## 2020-08-10 DIAGNOSIS — I2510 Atherosclerotic heart disease of native coronary artery without angina pectoris: Secondary | ICD-10-CM

## 2020-08-10 DIAGNOSIS — Z9889 Other specified postprocedural states: Secondary | ICD-10-CM

## 2020-08-10 DIAGNOSIS — R9439 Abnormal result of other cardiovascular function study: Secondary | ICD-10-CM

## 2020-08-10 NOTE — Progress Notes (Signed)
-----   Message -----  From: Quintella Reichert, MD  Sent: 08/08/2020  5:33 PM EST  To: Rosalio Macadamia, NP   Read your note and agree - can you go ahead and refer to [facility] just so they can discuss CABG with him and he can make an informed decision    Referral has been placed for CV[facility].

## 2020-08-21 ENCOUNTER — Encounter: Payer: Self-pay | Admitting: Thoracic Surgery (Cardiothoracic Vascular Surgery)

## 2020-08-21 ENCOUNTER — Encounter: Payer: Self-pay | Admitting: *Deleted

## 2020-08-21 ENCOUNTER — Other Ambulatory Visit: Payer: Self-pay

## 2020-08-21 ENCOUNTER — Institutional Professional Consult (permissible substitution): Payer: Medicare Other | Admitting: Thoracic Surgery (Cardiothoracic Vascular Surgery)

## 2020-08-21 ENCOUNTER — Other Ambulatory Visit: Payer: Self-pay | Admitting: *Deleted

## 2020-08-21 VITALS — BP 160/85 | HR 67 | Resp 20 | Ht 66.0 in | Wt 232.0 lb

## 2020-08-21 DIAGNOSIS — I2510 Atherosclerotic heart disease of native coronary artery without angina pectoris: Secondary | ICD-10-CM | POA: Diagnosis not present

## 2020-08-21 NOTE — Progress Notes (Signed)
[address]       [place] [address]             [phone]        Juan Hatfield Medical Record #[id number] Date of Birth: 20-Oct-1945  Referring: Sueanne Margarita, MD Primary Care: No primary care provider on file. Primary Cardiologist:Traci Radford Pax, MD  Chief Complaint:    Chief Complaint  Patient presents with  . Coronary Artery Disease    Surgical Consult, Cardiac Cath 07/20/20, Cardiac CT 07/07/20,     History of Present Illness:     75 year old male presents for surgical evaluation of three-vessel coronary artery disease.  Approximately 3 to 4 months ago he experienced some indigestion while out mowing his lawn.  This led to an extensive cardiac work-up which eventually led to a left heart cath which showed three-vessel coronary disease.  In regards to his symptoms he states he occasionally has some indigestion-like symptoms in the middle of his chest with exertion.  He also complains of some exertional dyspnea.  Overall he remains very active and does a lot of work outside without any limitations.  He denies any lower extremity swelling, orthopnea.   Past Medical and Surgical History: Previous Chest Surgery: Right-sided chest tube Previous Chest Radiation: No Diabetes Mellitus: Yes.  HbA1C pending Creatinine: 1.47  Past Medical History:  Diagnosis Date  . 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 Diabetes mellitus ([place])   . Hypertension   . Patient is Jehovah's Witness   . Renal insufficiency     Past Surgical History:  Procedure Laterality Date  . COLONOSCOPY    . COLONOSCOPY WITH PROPOFOL N/A 04/02/2019   Procedure: COLONOSCOPY WITH PROPOFOL;  Surgeon: Wilford Corner, MD;  [facility];  Service: Endoscopy;  Laterality: N/A;  . LEFT HEART CATH AND CORONARY ANGIOGRAPHY N/A 07/20/2020   Procedure: LEFT HEART CATH AND CORONARY ANGIOGRAPHY;  Surgeon: Lorretta Harp, MD;  Location: [facility] [facility];  Service:  Cardiovascular;  Laterality: N/A;  . LUMBAR LAMINECTOMY/DECOMPRESSION MICRODISCECTOMY Right 01/01/2015   Procedure: LUMBAR LAMINECTOMY/DECOMPRESSION MICRODISCECTOMY RIGHT LUMBAR FIVE SACRAL ONE;  Surgeon: Jovita Gamma, MD;  Location: [facility];  Service: Neurosurgery;  Laterality: Right;  . POLYPECTOMY  04/02/2019   Procedure: POLYPECTOMY;  Surgeon: Wilford Corner, MD;  [facility];  Service: Endoscopy;;    Social History: Support: He comes to clinic with his daughter today who is very proactive in his care.  Social History   Tobacco Use  Smoking Status Never Smoker  Smokeless Tobacco Never Used    Social History   Substance and Sexual Activity  Alcohol Use Not Currently     Allergies  Allergen Reactions  . Other     No blood or blood products     Current Outpatient Medications  Medication Sig Dispense Refill  . aspirin 81 MG tablet Take 81 mg by mouth daily.    . carvedilol (COREG) 3.125 MG tablet Take 1 tablet (3.125 mg total) by mouth 2 (two) times daily. 60 tablet 1  . Coenzyme Q10 (CO Q 10) 100 MG CAPS Take 100 mg by mouth daily.     . isosorbide mononitrate (IMDUR) 30 MG 24 hr tablet Take 1 tablet (30 mg total) by mouth daily. 30 tablet 0  . LEVEMIR FLEXTOUCH 100 UNIT/ML FlexPen Inject 30 Units into the skin daily.     Marland Kitchen losartan (COZAAR) 25 MG tablet Take 25 mg by  mouth daily.    . Magnesium 250 MG TABS Take 250 mg by mouth daily.    . milk thistle 175 MG tablet Take 175 mg by mouth daily.    . Multiple Vitamins-Minerals (ALIVE ENERGY 50+) TABS Take 1 tablet by mouth daily.    . Multiple Vitamins-Minerals (MULTIVITAMIN ADULT PO) Take 1 tablet by mouth daily.    . nitroGLYCERIN (NITROSTAT) 0.4 MG SL tablet Place 1 tablet (0.4 mg total) under the tongue every 5 (five) minutes as needed for chest pain (up to 3 doses). 25 tablet 0  . Omega-3 Fatty Acids (FISH OIL) 1000 MG CAPS Take 1,000 mg by mouth daily.    . rosuvastatin (CRESTOR) 5 MG tablet Take 5  mg by mouth daily.    . sitaGLIPtin-metformin (JANUMET) 50-1000 MG per tablet Take 1 tablet by mouth 2 (two) times daily with a meal.     Current Facility-Administered Medications  Medication Dose Route Frequency Provider Last Rate Last Admin  . sodium chloride flush (NS) 0.9 % injection 3 mL  3 mL Intravenous Q12H Dunn, Tacey Ruiz, PA-C        (Not in a hospital admission)   Family History  Problem Relation Age of Onset  . Cholecystitis Mother   . Lung disease Father   . CAD Brother      Review of Systems:   Review of Systems  Constitutional: Positive for malaise/fatigue.  Respiratory: Positive for shortness of breath.   Cardiovascular: Negative for chest pain, palpitations, orthopnea and leg swelling.  Gastrointestinal: Positive for heartburn.  Neurological: Negative.       Physical Exam: BP (!) 160/85   Pulse 67   Resp 20   Ht 5\' 6"  (1.676 m)   Wt 232 lb (105.2 kg)   SpO2 99% Comment: RA  BMI 37.45 kg/m  Physical Exam Constitutional:      General: He is not in acute distress.    Appearance: Normal appearance. He is obese. He is not ill-appearing or toxic-appearing.  HENT:     Head: Normocephalic and atraumatic.  Eyes:     Extraocular Movements: Extraocular movements intact.  Cardiovascular:     Rate and Rhythm: Normal rate and regular rhythm.     Heart sounds: No murmur heard.   Pulmonary:     Effort: No respiratory distress.     Breath sounds: Normal breath sounds.  Abdominal:     General: There is no distension.  Musculoskeletal:        General: Normal range of motion.     Cervical back: Normal range of motion.  Skin:    General: Skin is warm and dry.  Neurological:     General: No focal deficit present.     Mental Status: He is alert and oriented to person, place, and time.       Diagnostic Studies & Laboratory data:    Left Heart Catherization: Three-vessel coronary disease.  He has tandem lesions in his LAD and diagonal branch.  He also has  a tight stenosis in his ramus, and a distal stenosis and his obtuse marginal branch.  His right coronary artery is completely occluded and there appears to be some left to right filling. Echo: Pending   I have independently reviewed the above radiologic studies and discussed with the patient   Recent Lab Findings: Lab Results  Component Value Date   WBC 8.1 08/03/2020   HGB 13.6 08/03/2020   HCT 42.7 08/03/2020   PLT 236 08/03/2020  GLUCOSE 148 (H) 08/03/2020   CHOL 77 (L) 07/15/2020   TRIG 82 07/15/2020   HDL 47 07/15/2020   LDLCALC 13 07/15/2020   ALT 19 07/15/2020   AST 16 07/15/2020   NA 142 08/03/2020   K 4.6 08/03/2020   CL 101 08/03/2020   CREATININE 1.47 (H) 08/03/2020   BUN 22 08/03/2020   CO2 28 08/03/2020   HGBA1C 6.6 (H) 12/29/2014      Assessment / Plan:   75 year old male with three-vessel coronary disease, morbid obesity, and diabetes.  I personally reviewed his left heart cath and he has good targets for surgical revascularization.  The PDA appears to fill from left to right collaterals.  We discussed the risks, benefits and alternatives of surgical revascularization and he is agreeable to proceed.  He would like some time to get his affairs in order and is tentatively scheduled for September 15, 2020.  He will require an echocardiogram prior to surgery to evaluate for any significant valvular disease.     I  spent 40 minutes counseling the patient face to face.   Lajuana Matte 08/21/2020 10:37 AM

## 2020-09-04 ENCOUNTER — Encounter: Payer: Self-pay | Admitting: Cardiology

## 2020-09-04 ENCOUNTER — Other Ambulatory Visit: Payer: Self-pay

## 2020-09-04 ENCOUNTER — Encounter: Payer: Medicare Other | Admitting: Cardiology

## 2020-09-04 NOTE — Progress Notes (Signed)
This encounter was created in error - please disregard.

## 2020-09-10 NOTE — Progress Notes (Signed)
[place] ([place]), [place] - [address] [place] ([place]) [place] [address] Phone: [phone] Fax: [place], [place] - [address] [place] [address] Phone: [phone] Fax: [phone]      Your procedure is scheduled on Tuesday, September 15, 2020.  Report to [facility] Main Entrance "A" at 05:00 A.M., and check in at the Admitting office.  Call this number if you have problems or questions between now and the morning of surgery:  [phone]    Remember:  Do not eat or drink after midnight the night before your surgery.   Take these medicines the morning of surgery with A SIP OF WATER: Carvedilol (Coreg) Isosorbide mononitrate (Imdur) Rosuvastatin (Crestor)  If needed you may take Nitroglycerin (Nitrostat) the morning of surgery for chest pain.  As of today, STOP taking any NSAIDs - Aleve, Naproxen, Ibuprofen, Motrin, Advil, Goody's, BC's; all herbal medications - including milk thistle; fish oil; and all vitamins.  Continue taking your aspirin up until the day of surgery but do not take the morning of surgery.   WHAT DO I DO ABOUT MY DIABETES MEDICATION?  Marland Kitchen Do not take oral diabetes medicines (pills) the morning of surgery.   . THE MORNING OF SURGERY, take 15 units of Levemir Flextouch insulin FlexPen.   HOW TO MANAGE YOUR DIABETES BEFORE AND AFTER SURGERY  Why is it important to control my blood sugar before and after surgery? . Improving blood sugar levels before and after surgery helps healing and can limit problems. . A way of improving blood sugar control is eating a healthy diet by: o  Eating less sugar and carbohydrates o  Increasing activity/exercise o  Talking with your doctor about reaching your blood sugar goals . High blood sugars (greater than 180 mg/dL) can raise your risk of infections and slow your recovery, so you  will need to focus on controlling your diabetes during the weeks before surgery. . Make sure that the doctor who takes care of your diabetes knows about your planned surgery including the date and location.  How do I manage my blood sugar before surgery? . Check your blood sugar at least 4 times a day, starting 2 days before surgery, to make sure that the level is not too high or low. . Check your blood sugar the morning of your surgery when you wake up and every 2 hours until you get to the Short Stay unit. o If your blood sugar is less than 70 mg/dL, you will need to treat for low blood sugar: - Do not take insulin. - Treat a low blood sugar (less than 70 mg/dL) with  cup of clear juice (cranberry or apple), 4 glucose tablets, OR glucose gel. - Recheck blood sugar in 15 minutes after treatment (to make sure it is greater than 70 mg/dL). If your blood sugar is not greater than 70 mg/dL on recheck, call [phone] for further instructions. . Report your blood sugar to the short stay nurse when you get to Short Stay.  . If you are admitted to the hospital after surgery: o Your blood sugar will be checked by the staff and you will probably be given insulin after surgery (instead of oral diabetes medicines) to make sure you have good blood sugar levels. o The goal for blood sugar control after surgery is 80-180 mg/dL.  Do not wear jewelry            Do not wear lotions, powders, colognes, or deodorant.            Do not shave 48 hours prior to surgery.  Men may shave face and neck.            Do not bring valuables to the hospital.            [facility] is not responsible for any belongings or valuables.  Do NOT Smoke (Tobacco/Vaping) or drink Alcohol 24 hours prior to your procedure  If you use a CPAP at night, you may bring all equipment for your overnight stay.   Contacts, glasses, hearing aids, dentures or partials may not be worn into surgery.      For patients admitted  to the hospital, discharge time will be determined by your treatment team.   Patients discharged the day of surgery will not be allowed to drive home, and someone needs to stay with them for 24 hours.    Special instructions:   [place]- Preparing For Surgery  Before surgery, you can play an important role. Because skin is not sterile, your skin needs to be as free of germs as possible. You can reduce the number of germs on your skin by washing with CHG (chlorahexidine gluconate) Soap before surgery.  CHG is an antiseptic cleaner which kills germs and bonds with the skin to continue killing germs even after washing.    Oral Hygiene is also important to reduce your risk of infection.  Remember - BRUSH YOUR TEETH THE MORNING OF SURGERY WITH YOUR REGULAR TOOTHPASTE  Please do not use if you have an allergy to CHG or antibacterial soaps. If your skin becomes reddened/irritated stop using the CHG.  Do not shave (including legs and underarms) for at least 48 hours prior to first CHG shower. It is OK to shave your face.  Please follow these instructions carefully.   1. Shower the NIGHT BEFORE SURGERY and the MORNING OF SURGERY with CHG Soap.   2. If you chose to wash your hair, wash your hair first as usual with your normal shampoo.  3. After you shampoo, rinse your hair and body thoroughly to remove the shampoo.  4. Use CHG as you would any other liquid soap. You can apply CHG directly to the skin and wash gently with a scrungie or a clean washcloth.   5. Apply the CHG Soap to your body ONLY FROM THE NECK DOWN.  Do not use on open wounds or open sores. Avoid contact with your eyes, ears, mouth and genitals (private parts). Wash Face and genitals (private parts)  with your normal soap.   6. Wash thoroughly, paying special attention to the area where your surgery will be performed.  7. Thoroughly rinse your body with warm water from the neck down.  8. DO NOT shower/wash with your normal  soap after using and rinsing off the CHG Soap.  9. Pat yourself dry with a CLEAN TOWEL.  10. Wear CLEAN PAJAMAS to bed the night before surgery  11. Place CLEAN SHEETS on your bed the night of your first shower and DO NOT SLEEP WITH PETS.   Day of Surgery: Shower with CHG soap as directed. Wear Clean/Comfortable clothing the morning of surgery. Do not apply any deodorants/lotions.   Remember to brush your teeth WITH YOUR REGULAR TOOTHPASTE.   Please read over the following fact sheets that you  were given.

## 2020-09-11 ENCOUNTER — Other Ambulatory Visit (HOSPITAL_COMMUNITY)
Admission: RE | Admit: 2020-09-11 | Discharge: 2020-09-11 | Disposition: A | Discharge status: Home / Self Care | Payer: Medicare Other | Source: Ambulatory Visit | Attending: Thoracic Surgery (Cardiothoracic Vascular Surgery) | Admitting: Thoracic Surgery (Cardiothoracic Vascular Surgery)

## 2020-09-11 ENCOUNTER — Ambulatory Visit (HOSPITAL_BASED_OUTPATIENT_CLINIC_OR_DEPARTMENT_OTHER)
Admission: RE | Admit: 2020-09-11 | Discharge: 2020-09-11 | Disposition: A | Discharge status: Home / Self Care | Payer: Medicare Other | Source: Ambulatory Visit | Attending: Thoracic Surgery (Cardiothoracic Vascular Surgery) | Admitting: Thoracic Surgery (Cardiothoracic Vascular Surgery)

## 2020-09-11 ENCOUNTER — Other Ambulatory Visit: Payer: Self-pay

## 2020-09-11 ENCOUNTER — Encounter (HOSPITAL_COMMUNITY): Payer: Self-pay

## 2020-09-11 ENCOUNTER — Encounter (HOSPITAL_COMMUNITY)
Admission: RE | Admit: 2020-09-11 | Discharge: 2020-09-11 | Disposition: A | Discharge status: Home / Self Care | Payer: Medicare Other | Source: Ambulatory Visit | Attending: Thoracic Surgery (Cardiothoracic Vascular Surgery) | Admitting: Thoracic Surgery (Cardiothoracic Vascular Surgery)

## 2020-09-11 ENCOUNTER — Ambulatory Visit (HOSPITAL_COMMUNITY)
Admission: RE | Admit: 2020-09-11 | Discharge: 2020-09-11 | Disposition: A | Discharge status: Home / Self Care | Payer: Medicare Other | Source: Ambulatory Visit | Attending: Thoracic Surgery (Cardiothoracic Vascular Surgery) | Admitting: Thoracic Surgery (Cardiothoracic Vascular Surgery)

## 2020-09-11 DIAGNOSIS — Z20822 Contact with and (suspected) exposure to covid-19: Secondary | ICD-10-CM | POA: Diagnosis not present

## 2020-09-11 DIAGNOSIS — E669 Obesity, unspecified: Secondary | ICD-10-CM | POA: Insufficient documentation

## 2020-09-11 DIAGNOSIS — G4733 Obstructive sleep apnea (adult) (pediatric): Secondary | ICD-10-CM | POA: Insufficient documentation

## 2020-09-11 DIAGNOSIS — R079 Chest pain, unspecified: Secondary | ICD-10-CM | POA: Insufficient documentation

## 2020-09-11 DIAGNOSIS — E1122 Type 2 diabetes mellitus with diabetic chronic kidney disease: Secondary | ICD-10-CM | POA: Insufficient documentation

## 2020-09-11 DIAGNOSIS — I129 Hypertensive chronic kidney disease with stage 1 through stage 4 chronic kidney disease, or unspecified chronic kidney disease: Secondary | ICD-10-CM | POA: Insufficient documentation

## 2020-09-11 DIAGNOSIS — Z794 Long term (current) use of insulin: Secondary | ICD-10-CM | POA: Insufficient documentation

## 2020-09-11 DIAGNOSIS — Z79899 Other long term (current) drug therapy: Secondary | ICD-10-CM | POA: Insufficient documentation

## 2020-09-11 DIAGNOSIS — I2510 Atherosclerotic heart disease of native coronary artery without angina pectoris: Secondary | ICD-10-CM | POA: Diagnosis not present

## 2020-09-11 DIAGNOSIS — Z01818 Encounter for other preprocedural examination: Secondary | ICD-10-CM | POA: Insufficient documentation

## 2020-09-11 DIAGNOSIS — E119 Type 2 diabetes mellitus without complications: Secondary | ICD-10-CM | POA: Diagnosis not present

## 2020-09-11 DIAGNOSIS — N189 Chronic kidney disease, unspecified: Secondary | ICD-10-CM | POA: Insufficient documentation

## 2020-09-11 DIAGNOSIS — Z6838 Body mass index (BMI) 38.0-38.9, adult: Secondary | ICD-10-CM | POA: Insufficient documentation

## 2020-09-11 DIAGNOSIS — Z9989 Dependence on other enabling machines and devices: Secondary | ICD-10-CM | POA: Insufficient documentation

## 2020-09-11 DIAGNOSIS — I110 Hypertensive heart disease with heart failure: Secondary | ICD-10-CM | POA: Diagnosis present

## 2020-09-11 DIAGNOSIS — Z7982 Long term (current) use of aspirin: Secondary | ICD-10-CM | POA: Insufficient documentation

## 2020-09-11 DIAGNOSIS — Z951 Presence of aortocoronary bypass graft: Secondary | ICD-10-CM | POA: Insufficient documentation

## 2020-09-11 HISTORY — DX: Sleep apnea, unspecified: G47.30

## 2020-09-11 LAB — URINALYSIS, ROUTINE W REFLEX MICROSCOPIC
Bilirubin Urine: NEGATIVE
Glucose, UA: NEGATIVE mg/dL
Hgb urine dipstick: NEGATIVE
Ketones, ur: NEGATIVE mg/dL
Leukocytes,Ua: NEGATIVE
Nitrite: NEGATIVE
Protein, ur: NEGATIVE mg/dL
Specific Gravity, Urine: 1.016 (ref 1.005–1.030)
pH: 5 (ref 5.0–8.0)

## 2020-09-11 LAB — BLOOD GAS, ARTERIAL
Acid-Base Excess: 3.2 mmol/L — ABNORMAL HIGH (ref 0.0–2.0)
Bicarbonate: 27.5 mmol/L (ref 20.0–28.0)
FIO2: 21
O2 Saturation: 97.3 %
Patient temperature: 37
pCO2 arterial: 44.5 mmHg (ref 32.0–48.0)
pH, Arterial: 7.408 (ref 7.350–7.450)
pO2, Arterial: 90.3 mmHg (ref 83.0–108.0)

## 2020-09-11 LAB — COMPREHENSIVE METABOLIC PANEL
ALT: 22 U/L (ref 0–44)
AST: 25 U/L (ref 15–41)
Albumin: 3.8 g/dL (ref 3.5–5.0)
Alkaline Phosphatase: 76 U/L (ref 38–126)
Anion gap: 8 (ref 5–15)
BUN: 13 mg/dL (ref 8–23)
CO2: 26 mmol/L (ref 22–32)
Calcium: 9.2 mg/dL (ref 8.9–10.3)
Chloride: 104 mmol/L (ref 98–111)
Creatinine, Ser: 1.51 mg/dL — ABNORMAL HIGH (ref 0.61–1.24)
GFR, Estimated: 49 mL/min — ABNORMAL LOW (ref >60)
Glucose, Bld: 94 mg/dL (ref 70–99)
Potassium: 4.2 mmol/L (ref 3.5–5.1)
Sodium: 138 mmol/L (ref 135–145)
Total Bilirubin: 0.8 mg/dL (ref 0.3–1.2)
Total Protein: 6.8 g/dL (ref 6.5–8.1)

## 2020-09-11 LAB — CBC
HCT: 44.2 % (ref 39.0–52.0)
Hemoglobin: 13.8 g/dL (ref 13.0–17.0)
MCH: 29.4 pg (ref 26.0–34.0)
MCHC: 31.2 g/dL (ref 30.0–36.0)
MCV: 94.2 fL (ref 80.0–100.0)
Platelets: 234 10*3/uL (ref 150–400)
RBC: 4.69 MIL/uL (ref 4.22–5.81)
RDW: 11.7 % (ref 11.5–15.5)
WBC: 6.3 10*3/uL (ref 4.0–10.5)
nRBC: 0 % (ref 0.0–0.2)

## 2020-09-11 LAB — PROTIME-INR
INR: 1.1 (ref 0.8–1.2)
Prothrombin Time: 13.3 seconds (ref 11.4–15.2)

## 2020-09-11 LAB — SURGICAL PCR SCREEN
MRSA, PCR: NEGATIVE
Staphylococcus aureus: NEGATIVE

## 2020-09-11 LAB — ECHOCARDIOGRAM COMPLETE
Area-P 1/2: 3.6 cm2
Height: 66 in
S' Lateral: 3 cm
Weight: 3795.2 oz

## 2020-09-11 LAB — HEMOGLOBIN A1C
Hgb A1c MFr Bld: 6 % — ABNORMAL HIGH (ref 4.8–5.6)
Mean Plasma Glucose: 125.5 mg/dL

## 2020-09-11 LAB — SARS CORONAVIRUS 2 (TAT 6-24 HRS): SARS Coronavirus 2: NEGATIVE

## 2020-09-11 LAB — NO BLOOD PRODUCTS

## 2020-09-11 LAB — GLUCOSE, CAPILLARY: Glucose-Capillary: 140 mg/dL — ABNORMAL HIGH (ref 70–99)

## 2020-09-11 LAB — APTT: aPTT: 31 seconds (ref 24–36)

## 2020-09-11 NOTE — CV Procedure (Signed)
Pre CABG exam completed  Results can be found under chart review under CV PROC. 09/11/2020 4:59 PM Manuelita Moxon RVT, RDMS

## 2020-09-11 NOTE — Progress Notes (Addendum)
PCP - Wynelle Fanny, DO at [facility]); records requested for last office note, labs, etc. Cardiologist - Fransico Him, MD  PPM/ICD - Denies  Chest x-ray - 09/11/20 EKG - 09/11/20 Stress Test - 07/07/20 ECHO - scheduled for 09/11/20 (patient escorted to echo lab) Cardiac Cath - 07/20/20 DOPPLERS- scheduled for 09/11/20 (patient escorted area)  Sleep Study - Yes CPAP - Yes  Fasting Blood Sugar 140-145 Checks Blood Sugar x1 daily CBG at PAT appt was 140. A1C obtained. Per pt, diabetes managed by Dr. Wynn Banker.  Blood Thinner Instructions: N/A Aspirin Instructions: Continue; none on DOS  ERAS Protcol - N/A PRE-SURGERY Ensure or G2- N/A  COVID TEST- 09/11/20; pending  Pt refused blood products; Blood products refusal signed and faxed to blood bank.  Anesthesia review: Yes, cardiac hx; review last office note from Dr. Wynn Banker  Patient denies shortness of breath, fever, cough and chest pain at PAT appointment   All instructions explained to the patient, with a verbal understanding of the material. Patient agrees to go over the instructions while at home for a better understanding. Patient also instructed to self quarantine after being tested for COVID-19. The opportunity to ask questions was provided.

## 2020-09-11 NOTE — Progress Notes (Signed)
Your procedure is scheduled on Tuesday, September 15, 2020.  Report to [facility] Main Entrance "A" at 05:30 A.M., and check in at the Admitting office.  Call this number if you have problems or questions between now and the morning of surgery:  [phone]    Remember:  Do not eat or drink after midnight the night before your surgery.   Take these medicines the morning of surgery with A SIP OF WATER: Carvedilol (Coreg) Isosorbide mononitrate (Imdur) Rosuvastatin (Crestor)  If needed you may take Nitroglycerin (Nitrostat) the morning of surgery for chest pain.  As of today, STOP taking any NSAIDs - Aleve, Naproxen, Ibuprofen, Motrin, Advil, Goody's, BC's; all herbal medications - including milk thistle; fish oil; and all vitamins.  Continue taking your aspirin up until the day of surgery but do not take the morning of surgery.   WHAT DO I DO ABOUT MY DIABETES MEDICATION?  Marland Kitchen Do not take oral diabetes medicines (pills) the morning of surgery.   . THE MORNING OF SURGERY, take 15 units of Levemir Flextouch insulin FlexPen.   HOW TO MANAGE YOUR DIABETES BEFORE AND AFTER SURGERY  Why is it important to control my blood sugar before and after surgery? . Improving blood sugar levels before and after surgery helps healing and can limit problems. . A way of improving blood sugar control is eating a healthy diet by: o  Eating less sugar and carbohydrates o  Increasing activity/exercise o  Talking with your doctor about reaching your blood sugar goals . High blood sugars (greater than 180 mg/dL) can raise your risk of infections and slow your recovery, so you will need to focus on controlling your diabetes during the weeks before surgery. . Make sure that the doctor who takes care of your diabetes knows about your planned surgery including the date and location.  How do I manage my blood sugar before surgery? . Check your blood sugar at least 4 times a day, starting 2 days before  surgery, to make sure that the level is not too high or low. . Check your blood sugar the morning of your surgery when you wake up and every 2 hours until you get to the Short Stay unit. o If your blood sugar is less than 70 mg/dL, you will need to treat for low blood sugar: - Do not take insulin. - Treat a low blood sugar (less than 70 mg/dL) with  cup of clear juice (cranberry or apple), 4 glucose tablets, OR glucose gel. - Recheck blood sugar in 15 minutes after treatment (to make sure it is greater than 70 mg/dL). If your blood sugar is not greater than 70 mg/dL on recheck, call [phone] for further instructions. . Report your blood sugar to the short stay nurse when you get to Short Stay.  . If you are admitted to the hospital after surgery: o Your blood sugar will be checked by the staff and you will probably be given insulin after surgery (instead of oral diabetes medicines) to make sure you have good blood sugar levels. o The goal for blood sugar control after surgery is 80-180 mg/dL.              Do not wear jewelry            Do not wear lotions, powders, colognes, or deodorant.            Do not shave 48 hours prior to surgery.  Men may shave face and neck.  Do not bring valuables to the hospital.            [facility] is not responsible for any belongings or valuables.  Do NOT Smoke (Tobacco/Vaping) or drink Alcohol 24 hours prior to your procedure  If you use a CPAP at night, you may bring all equipment for your overnight stay.   Contacts, glasses, hearing aids, dentures or partials may not be worn into surgery.      For patients admitted to the hospital, discharge time will be determined by your treatment team.   Patients discharged the day of surgery will not be allowed to drive home, and someone needs to stay with them for 24 hours.    Special instructions:   [place]- Preparing For Surgery  Before surgery, you can play an important role. Because  skin is not sterile, your skin needs to be as free of germs as possible. You can reduce the number of germs on your skin by washing with CHG (chlorahexidine gluconate) Soap before surgery.  CHG is an antiseptic cleaner which kills germs and bonds with the skin to continue killing germs even after washing.    Oral Hygiene is also important to reduce your risk of infection.  Remember - BRUSH YOUR TEETH THE MORNING OF SURGERY WITH YOUR REGULAR TOOTHPASTE  Please do not use if you have an allergy to CHG or antibacterial soaps. If your skin becomes reddened/irritated stop using the CHG.  Do not shave (including legs and underarms) for at least 48 hours prior to first CHG shower. It is OK to shave your face.  Please follow these instructions carefully.   1. Shower the NIGHT BEFORE SURGERY and the MORNING OF SURGERY with CHG Soap.   2. If you chose to wash your hair, wash your hair first as usual with your normal shampoo.  3. After you shampoo, rinse your hair and body thoroughly to remove the shampoo.  4. Use CHG as you would any other liquid soap. You can apply CHG directly to the skin and wash gently with a scrungie or a clean washcloth.   5. Apply the CHG Soap to your body ONLY FROM THE NECK DOWN.  Do not use on open wounds or open sores. Avoid contact with your eyes, ears, mouth and genitals (private parts). Wash Face and genitals (private parts)  with your normal soap.   6. Wash thoroughly, paying special attention to the area where your surgery will be performed.  7. Thoroughly rinse your body with warm water from the neck down.  8. DO NOT shower/wash with your normal soap after using and rinsing off the CHG Soap.  9. Pat yourself dry with a CLEAN TOWEL.  10. Wear CLEAN PAJAMAS to bed the night before surgery  11. Place CLEAN SHEETS on your bed the night of your first shower and DO NOT SLEEP WITH PETS.   Day of Surgery: Shower with CHG soap as directed. Wear Clean/Comfortable  clothing the morning of surgery. Do not apply any deodorants/lotions.   Remember to brush your teeth WITH YOUR REGULAR TOOTHPASTE.   Please read over the following fact sheets that you were given.

## 2020-09-11 NOTE — Progress Notes (Signed)
  Echocardiogram 2D Echocardiogram has been performed.  Juan Hatfield 09/11/2020, 1:37 PM

## 2020-09-14 ENCOUNTER — Inpatient Hospital Stay (HOSPITAL_COMMUNITY): Payer: Medicare Other | Admitting: Certified Registered"

## 2020-09-14 MED ORDER — SODIUM CHLORIDE 0.9 % IV SOLN
750.0000 mg | INTRAVENOUS | Status: DC
  Filled 2020-09-14: qty 750

## 2020-09-14 MED ORDER — EPINEPHRINE HCL 5 MG/250ML IV SOLN IN NS
0.0000 ug/min | INTRAVENOUS | Status: DC
  Filled 2020-09-14: qty 250

## 2020-09-14 MED ORDER — MILRINONE LACTATE IN DEXTROSE 20-5 MG/100ML-% IV SOLN
0.3000 ug/kg/min | INTRAVENOUS | Status: DC
  Filled 2020-09-14: qty 100

## 2020-09-14 MED ORDER — SODIUM CHLORIDE 0.9 % IV SOLN
INTRAVENOUS | Status: DC
  Filled 2020-09-14: qty 30

## 2020-09-14 MED ORDER — TRANEXAMIC ACID (OHS) PUMP PRIME SOLUTION
2.0000 mg/kg | INTRAVENOUS | Status: DC
  Filled 2020-09-14: qty 2.15

## 2020-09-14 MED ORDER — TRANEXAMIC ACID 1000 MG/10ML IV SOLN
1.5000 mg/kg/h | INTRAVENOUS | Status: DC
  Filled 2020-09-14: qty 25

## 2020-09-14 MED ORDER — POTASSIUM CHLORIDE 2 MEQ/ML IV SOLN
80.0000 meq | INTRAVENOUS | Status: DC
  Filled 2020-09-14: qty 40

## 2020-09-14 MED ORDER — NOREPINEPHRINE 4 MG/250ML-% IV SOLN
0.0000 ug/min | INTRAVENOUS | Status: DC
  Filled 2020-09-14: qty 250

## 2020-09-14 MED ORDER — PHENYLEPHRINE HCL-NACL 20-0.9 MG/250ML-% IV SOLN
30.0000 ug/min | INTRAVENOUS | Status: DC
  Filled 2020-09-14: qty 250

## 2020-09-14 MED ORDER — PLASMA-LYTE 148 IV SOLN
INTRAVENOUS | Status: DC
  Filled 2020-09-14: qty 2.5

## 2020-09-14 MED ORDER — DEXMEDETOMIDINE HCL IN NACL 400 MCG/100ML IV SOLN
0.1000 ug/kg/h | INTRAVENOUS | Status: DC
  Filled 2020-09-14: qty 100

## 2020-09-14 MED ORDER — TRANEXAMIC ACID (OHS) BOLUS VIA INFUSION
15.0000 mg/kg | INTRAVENOUS | Status: DC
  Filled 2020-09-14: qty 1614

## 2020-09-14 MED ORDER — MANNITOL 20 % IV SOLN
Freq: Once | INTRAVENOUS | Status: DC
  Filled 2020-09-14: qty 13

## 2020-09-14 MED ORDER — NITROGLYCERIN IN D5W 200-5 MCG/ML-% IV SOLN
2.0000 ug/min | INTRAVENOUS | Status: DC
  Filled 2020-09-14: qty 250

## 2020-09-14 MED ORDER — VANCOMYCIN HCL 1500 MG/300ML IV SOLN
1500.0000 mg | INTRAVENOUS | Status: DC
  Filled 2020-09-14: qty 300

## 2020-09-14 MED ORDER — SODIUM CHLORIDE 0.9 % IV SOLN
1.5000 g | INTRAVENOUS | Status: DC
  Filled 2020-09-14: qty 1.5

## 2020-09-14 MED ORDER — INSULIN REGULAR(HUMAN) IN NACL 100-0.9 UT/100ML-% IV SOLN
INTRAVENOUS | Status: DC
  Filled 2020-09-14: qty 100

## 2020-09-14 NOTE — Anesthesia Preprocedure Evaluation (Addendum)
Anesthesia Evaluation  Patient identified by MRN, date of birth, ID band Patient awake    Reviewed: Allergy & Precautions, NPO status , Patient's Chart, lab work & pertinent test results  Airway Mallampati: II  TM Distance: >3 FB     Dental   Pulmonary sleep apnea ,    breath sounds clear to auscultation       Cardiovascular hypertension, + CAD   Rhythm:Regular Rate:Normal     Neuro/Psych negative neurological ROS     GI/Hepatic negative GI ROS, Neg liver ROS,   Endo/Other  diabetes  Renal/GU Renal disease     Musculoskeletal   Abdominal   Peds  Hematology   Anesthesia Other Findings   Reproductive/Obstetrics                           Anesthesia Physical Anesthesia Plan  ASA: III  Anesthesia Plan: General   Post-op Pain Management:    Induction: Intravenous  PONV Risk Score and Plan: 2 and Ondansetron, Dexamethasone and Midazolam  Airway Management Planned: Oral ETT  Additional Equipment: Arterial line, PA Cath, TEE and Ultrasound Guidance Line Placement  Intra-op Plan:   Post-operative Plan: Post-operative intubation/ventilation  Informed Consent: I have reviewed the patients History and Physical, chart, labs and discussed the procedure including the risks, benefits and alternatives for the proposed anesthesia with the patient or authorized representative who has indicated his/her understanding and acceptance.     Dental advisory given  Plan Discussed with: Anesthesiologist and CRNA  Anesthesia Plan Comments: (PAT note written 09/14/2020 by Myra Gianotti, PA-C. He is a Restaurant manager, fast food. Pre-op CBC normal.  )     Anesthesia Quick Evaluation

## 2020-09-14 NOTE — Progress Notes (Signed)
Anesthesia Chart Review:  Case: [id number] Date/Time: 09/15/20 0715   Procedures:      CORONARY ARTERY BYPASS GRAFTING (CABG) (N/A Chest)     TRANSESOPHAGEAL ECHOCARDIOGRAM (TEE) (N/A )   Anesthesia type: General   Pre-op diagnosis: CAD   Location: [facility] OR ROOM 15 / [place] OR   Surgeons: Lajuana Matte, MD      DISCUSSION: Patient is a 75 year old male scheduled for the above procedure.  History includes never smoker, HTN, CKD, DM2, CAD, OSA (CPAP), back surgery (right L5-S1 laminotomy/microdiscectomy 01/01/15). Jehovah's Witness. BMI is consistent with obesity.   Preoperative CBC WNL--H/H 13.8/44.2 (Jehovah's Witness). He signed blood refusal (no blood or blood products be administered). Creatinine stable at 1.51. A1c 6.0%.   09/11/20 presurgical COVID-19 test negative. Anesthesia team to evaluate on the day of surgery.    VS: BP (!) 146/69   Pulse 64   Temp 36.5 C (Oral)   Resp 18   Ht 5\' 6"  (1.676 m)   Wt 107.6 kg   SpO2 100%   BMI 38.29 kg/m    PROVIDERS: Wynelle Fanny, DO is PCP [facility]) Fransico Him, MD is cardiologist   LABS: Labs reviewed: Acceptable for surgery. Cr 1.51, previously 1.47-1.67 since 03/12/19 by [facility] labs.  (all labs ordered are listed, but only abnormal results are displayed)  Labs Reviewed  GLUCOSE, CAPILLARY - Abnormal; Notable for the following components:      Result Value   Glucose-Capillary 140 (*)    All other components within normal limits  COMPREHENSIVE METABOLIC PANEL - Abnormal; Notable for the following components:   Creatinine, Ser 1.51 (*)    GFR, Estimated 49 (*)    All other components within normal limits  BLOOD GAS, ARTERIAL - Abnormal; Notable for the following components:   Acid-Base Excess 3.2 (*)    All other components within normal limits  HEMOGLOBIN A1C - Abnormal; Notable for the following components:   Hgb A1c MFr Bld 6.0 (*)    All other components within normal limits  SURGICAL PCR SCREEN  CBC   PROTIME-INR  APTT  URINALYSIS, ROUTINE W REFLEX MICROSCOPIC  NO BLOOD PRODUCTS     IMAGES: CXR 09/11/20: FINDINGS: Multiple remote right-sided rib fractures. Midline trachea. Mild cardiomegaly. Atherosclerosis in the transverse aorta. Right-sided pleural thickening and mild volume loss in the right hemithorax are not significantly changed. No left-sided pleural fluid. No pneumothorax. Mild scarring at the left lung base laterally. IMPRESSION: - No acute cardiopulmonary disease. - Remote posttraumatic changes within the right hemithorax with healed rib fractures and pleuroparenchymal scarring, similar. - Aortic Atherosclerosis (ICD10-I70.0).   EKG: 09/11/20: Sinus rhythm with 1st degree A-V block Left axis deviation Right bundle branch block Inferior infarct , age undetermined No significant change since last tracing Confirmed by Sanda Klein [phone]) on 09/11/2020 3:49:23 PM   CV: Cardiac cath 07/20/20:  Colon Flattery RCA to Prox RCA lesion is 100% stenosed.  1st Diag-1 lesion is 90% stenosed.  1st Diag-2 lesion is 95% stenosed.  Prox LAD to Mid LAD lesion is 90% stenosed.  Mid LAD lesion is 70% stenosed.  3rd Mrg lesion is 95% stenosed.  Ramus lesion is 90% stenosed.   Echo 09/11/20: IMPRESSIONS  1. Left ventricular ejection fraction, by estimation, is 60 to 65%. The  left ventricle has normal function. The left ventricle has no regional  wall motion abnormalities. Left ventricular diastolic parameters are  consistent with Grade I diastolic  dysfunction (impaired relaxation). Elevated left ventricular end-diastolic  pressure.  2. Right ventricular systolic function is normal. The right ventricular  size is normal. There is normal pulmonary artery systolic pressure.  3. The mitral valve is normal in structure. Trivial mitral valve  regurgitation. No evidence of mitral stenosis.  4. The aortic valve is normal in structure. Aortic valve regurgitation is  not  visualized. No aortic stenosis is present.  5. The inferior vena cava is normal in size with greater than 50%  respiratory variability, suggesting right atrial pressure of 3 mmHg.    Carotid US 09/11/20: Summary:  - Right Carotid: Velocities in the right ICA are consistent with a 1-39% stenosis. The extracranial vessels were near-normal with only minimal wall thickening or plaque.  - Left Carotid: Velocities in the left ICA are consistent with a 1-39% stenosis. The extracranial vessels were near-normal with only minimal wallthickening or plaque.  - Vertebrals: Bilateral vertebral arteries demonstrate antegrade flow.  - Subclavians: Normal flow hemodynamics were seen in bilateral subclavian arteries.   Past Medical History:  Diagnosis Date  . 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 Diabetes mellitus ([place])   . Hypertension   . Patient is Jehovah's Witness   . Renal insufficiency   . Sleep apnea    CPAP nightly    Past Surgical History:  Procedure Laterality Date  . BACK SURGERY    . CARDIAC CATHETERIZATION    . COLONOSCOPY    . COLONOSCOPY WITH PROPOFOL N/A 04/02/2019   Procedure: COLONOSCOPY WITH PROPOFOL;  Surgeon: Wilford Corner, MD;  Location: [facility] ENDOSCOPY;  Service: Endoscopy;  Laterality: N/A;  . LEFT HEART CATH AND CORONARY ANGIOGRAPHY N/A 07/20/2020   Procedure: LEFT HEART CATH AND CORONARY ANGIOGRAPHY;  Surgeon: Lorretta Harp, MD;  Location: [place] CV LAB;  Service: Cardiovascular;  Laterality: N/A;  . LUMBAR LAMINECTOMY/DECOMPRESSION MICRODISCECTOMY Right 01/01/2015   Procedure: LUMBAR LAMINECTOMY/DECOMPRESSION MICRODISCECTOMY RIGHT LUMBAR FIVE SACRAL ONE;  Surgeon: Jovita Gamma, MD;  Location: [place] NEURO ORS;  Service: Neurosurgery;  Laterality: Right;  . POLYPECTOMY  04/02/2019   Procedure: POLYPECTOMY;  Surgeon: Wilford Corner, MD;  Location: [facility] ENDOSCOPY;  Service: Endoscopy;;    MEDICATIONS: . aspirin 81 MG tablet  . carvedilol (COREG)  3.125 MG tablet  . Coenzyme Q10 (CO Q 10) 100 MG CAPS  . isosorbide mononitrate (IMDUR) 30 MG 24 hr tablet  . LEVEMIR FLEXTOUCH 100 UNIT/ML FlexPen  . losartan (COZAAR) 25 MG tablet  . Magnesium 250 MG TABS  . milk thistle 175 MG tablet  . Multiple Vitamins-Minerals (ALIVE ENERGY 50+) TABS  . nitroGLYCERIN (NITROSTAT) 0.4 MG SL tablet  . Omega-3 Fatty Acids (FISH OIL) 1000 MG CAPS  . rosuvastatin (CRESTOR) 5 MG tablet  . sitaGLIPtin-metformin (JANUMET) 50-1000 MG per tablet   . sodium chloride flush (NS) 0.9 % injection 3 mL   . [START ON 09/15/2020] cefUROXime (ZINACEF) 1.5 g in sodium chloride 0.9 % 100 mL IVPB  . [START ON 09/15/2020] cefUROXime (ZINACEF) 750 mg in sodium chloride 0.9 % 100 mL IVPB  . [START ON 09/15/2020] dexmedetomidine (PRECEDEX) 400 MCG/100ML (4 mcg/mL) infusion  . [START ON 09/15/2020] EPINEPHrine (ADRENALIN) 4 mg in NS 250 mL (0.016 mg/mL) premix infusion  . [START ON 09/15/2020] heparin 30,000 units/NS 1000 mL solution for CELLSAVER  . [START ON 09/15/2020] heparin sodium (porcine) 2,500 Units, papaverine 30 mg in electrolyte-148 (PLASMALYTE-148) 500 mL irrigation  . [START ON 09/15/2020] insulin regular, human (MYXREDLIN) 100 units/ 100 mL infusion  . [place] Blood Cardioplegia vial (lidocaine/magnesium/mannitol 0.26g-4g-6.4g)  . [  START ON 09/15/2020] milrinone (PRIMACOR) 20 MG/100 ML (0.2 mg/mL) infusion  . [START ON 09/15/2020] nitroGLYCERIN 50 mg in dextrose 5 % 250 mL (0.2 mg/mL) infusion  . [START ON 09/15/2020] norepinephrine (LEVOPHED) 4mg  in 240mL premix infusion  . [START ON 09/15/2020] phenylephrine (NEOSYNEPHRINE) 20-0.9 MG/250ML-% infusion  . [START ON 09/15/2020] potassium chloride injection 80 mEq  . [START ON 09/15/2020] tranexamic acid (CYKLOKAPRON) 2,500 mg in sodium chloride 0.9 % 250 mL (10 mg/mL) infusion  . [START ON 09/15/2020] tranexamic acid (CYKLOKAPRON) bolus via infusion - over 30 minutes 1,614 mg  . [START ON 09/15/2020] tranexamic acid (CYKLOKAPRON) pump  prime solution 215 mg  . [START ON 09/15/2020] vancomycin (VANCOREADY) IVPB 1500 mg/300 mL    Myra Gianotti, PA-C Surgical Short Stay/Anesthesiology [facility] [place] Phone [phone] [facility] Phone [phone] 09/14/2020 11:45 AM

## 2020-09-15 ENCOUNTER — Ambulatory Visit (HOSPITAL_COMMUNITY)
Admission: RE | Admit: 2020-09-15 | Discharge: 2020-09-15 | Disposition: A | Discharge status: Home / Self Care | Payer: Medicare Other | Attending: Thoracic Surgery (Cardiothoracic Vascular Surgery) | Admitting: Thoracic Surgery (Cardiothoracic Vascular Surgery)

## 2020-09-15 ENCOUNTER — Other Ambulatory Visit: Payer: Self-pay

## 2020-09-15 ENCOUNTER — Inpatient Hospital Stay (HOSPITAL_COMMUNITY): Payer: Medicare Other

## 2020-09-15 ENCOUNTER — Other Ambulatory Visit: Payer: Self-pay | Admitting: *Deleted

## 2020-09-15 ENCOUNTER — Encounter (HOSPITAL_COMMUNITY): Payer: Self-pay | Admitting: Thoracic Surgery (Cardiothoracic Vascular Surgery)

## 2020-09-15 DIAGNOSIS — I2510 Atherosclerotic heart disease of native coronary artery without angina pectoris: Secondary | ICD-10-CM | POA: Diagnosis not present

## 2020-09-15 LAB — GLUCOSE, CAPILLARY: Glucose-Capillary: 120 mg/dL — ABNORMAL HIGH (ref 70–99)

## 2020-09-15 MED ORDER — METOPROLOL TARTRATE 12.5 MG HALF TABLET
12.5000 mg | ORAL_TABLET | Freq: Once | ORAL | Status: DC
  Filled 2020-09-15: qty 1

## 2020-09-15 MED ORDER — POTASSIUM CHLORIDE 2 MEQ/ML IV SOLN
80.0000 meq | INTRAVENOUS | Status: DC
  Filled 2020-09-15: qty 40

## 2020-09-15 MED ORDER — SODIUM CHLORIDE 0.9 % IV SOLN
INTRAVENOUS | Status: DC
  Filled 2020-09-15: qty 30

## 2020-09-15 MED ORDER — MILRINONE LACTATE IN DEXTROSE 20-5 MG/100ML-% IV SOLN
0.3000 ug/kg/min | INTRAVENOUS | Status: DC
  Filled 2020-09-15: qty 100

## 2020-09-15 MED ORDER — TRANEXAMIC ACID (OHS) PUMP PRIME SOLUTION
2.0000 mg/kg | INTRAVENOUS | Status: DC
  Filled 2020-09-15: qty 2.15

## 2020-09-15 MED ORDER — PHENYLEPHRINE HCL-NACL 20-0.9 MG/250ML-% IV SOLN
30.0000 ug/min | INTRAVENOUS | Status: DC
  Filled 2020-09-15: qty 250

## 2020-09-15 MED ORDER — DEXMEDETOMIDINE HCL IN NACL 400 MCG/100ML IV SOLN
0.1000 ug/kg/h | INTRAVENOUS | Status: DC
  Filled 2020-09-15: qty 100

## 2020-09-15 MED ORDER — SODIUM CHLORIDE 0.9 % IV SOLN
1.5000 g | INTRAVENOUS | Status: DC
  Filled 2020-09-15: qty 1.5

## 2020-09-15 MED ORDER — TRANEXAMIC ACID 1000 MG/10ML IV SOLN
1.5000 mg/kg/h | INTRAVENOUS | Status: DC
  Filled 2020-09-15: qty 25

## 2020-09-15 MED ORDER — PLASMA-LYTE 148 IV SOLN
INTRAVENOUS | Status: DC
  Filled 2020-09-15: qty 2.5

## 2020-09-15 MED ORDER — SODIUM CHLORIDE 0.9 % IV SOLN
750.0000 mg | INTRAVENOUS | Status: DC
  Filled 2020-09-15: qty 750

## 2020-09-15 MED ORDER — VANCOMYCIN HCL 1500 MG/300ML IV SOLN
1500.0000 mg | INTRAVENOUS | Status: DC
  Filled 2020-09-15: qty 300

## 2020-09-15 MED ORDER — NOREPINEPHRINE 4 MG/250ML-% IV SOLN
0.0000 ug/min | INTRAVENOUS | Status: DC
  Filled 2020-09-15: qty 250

## 2020-09-15 MED ORDER — MANNITOL 20 % IV SOLN
Freq: Once | INTRAVENOUS | Status: DC
  Filled 2020-09-15: qty 13

## 2020-09-15 MED ORDER — EPINEPHRINE HCL 5 MG/250ML IV SOLN IN NS
0.0000 ug/min | INTRAVENOUS | Status: DC
  Filled 2020-09-15: qty 250

## 2020-09-15 MED ORDER — NITROGLYCERIN IN D5W 200-5 MCG/ML-% IV SOLN
2.0000 ug/min | INTRAVENOUS | Status: DC
  Filled 2020-09-15: qty 250

## 2020-09-15 MED ORDER — CHLORHEXIDINE GLUCONATE 4 % EX LIQD: 30.0000 mL | CUTANEOUS | Status: DC

## 2020-09-15 MED ORDER — TRANEXAMIC ACID (OHS) BOLUS VIA INFUSION
15.0000 mg/kg | INTRAVENOUS | Status: DC
  Filled 2020-09-15: qty 1614

## 2020-09-15 MED ORDER — CHLORHEXIDINE GLUCONATE 0.12 % MT SOLN
15.0000 mL | Freq: Once | OROMUCOSAL | Status: AC
  Administered 2020-09-15: 15 mL via OROMUCOSAL
  Filled 2020-09-15: qty 15

## 2020-09-15 MED ORDER — INSULIN REGULAR(HUMAN) IN NACL 100-0.9 UT/100ML-% IV SOLN
INTRAVENOUS | Status: DC
  Filled 2020-09-15: qty 100

## 2020-09-15 NOTE — Progress Notes (Signed)
Per anesthesia patient will need to be retested for COVID upon arrival on 09/16/20 since surgery has been rescheduled for 09/16/20  Patients planned surgery for today 09/15/20 has been cancelled due to emergency case.  Physician will be unavailavle to perform surgery.  Patient has been explained the situation and verbalizes understanding.  Pateints surgery has been rescheduled for 09/16/20 at 0830 start time, patient will arrive at 0630 to be re COVID tested before that procedure

## 2020-09-15 NOTE — Progress Notes (Signed)
[place] ([place]), [place] - [address] [place] [address] Phone: [phone] Fax: [place], [place] - [address] [place] [address] Phone: [phone] Fax: [phone]      Your procedure is scheduled on Wednesday, September 16, 2020.  Report to [facility] Main Entrance "A" at [place].M., and check in at the Admitting office.  Call this number if you have problems or questions between now and the morning of surgery:  [phone]    Remember:  Do not eat or drink after midnight the night before your surgery.   Take these medicines the morning of surgery with A SIP OF WATER: Carvedilol (Coreg) Isosorbide mononitrate (Imdur) Rosuvastatin (Crestor)  If needed you may take Nitroglycerin (Nitrostat) the morning of surgery for chest pain.  As of today, STOP taking any NSAIDs - Aleve, Naproxen, Ibuprofen, Motrin, Advil, Goody's, BC's; all herbal medications - including milk thistle; fish oil; and all vitamins.  Continue taking your aspirin up until the day of surgery but do not take the morning of surgery.   WHAT DO I DO ABOUT MY DIABETES MEDICATION?  Marland Kitchen Do not take oral diabetes medicines (pills) the morning of surgery.   . THE MORNING OF SURGERY, take 15 units of Levemir Flextouch insulin FlexPen.   HOW TO MANAGE YOUR DIABETES BEFORE AND AFTER SURGERY  Why is it important to control my blood sugar before and after surgery? . Improving blood sugar levels before and after surgery helps healing and can limit problems. . A way of improving blood sugar control is eating a healthy diet by: o  Eating less sugar and carbohydrates o  Increasing activity/exercise o  Talking with your doctor about reaching your blood sugar goals . High blood sugars (greater than 180 mg/dL) can raise your risk of infections and slow your recovery, so you  will need to focus on controlling your diabetes during the weeks before surgery. . Make sure that the doctor who takes care of your diabetes knows about your planned surgery including the date and location.  How do I manage my blood sugar before surgery? . Check your blood sugar at least 4 times a day, starting 2 days before surgery, to make sure that the level is not too high or low. . Check your blood sugar the morning of your surgery when you wake up and every 2 hours until you get to the Short Stay unit. o If your blood sugar is less than 70 mg/dL, you will need to treat for low blood sugar: - Do not take insulin. - Treat a low blood sugar (less than 70 mg/dL) with  cup of clear juice (cranberry or apple), 4 glucose tablets, OR glucose gel. - Recheck blood sugar in 15 minutes after treatment (to make sure it is greater than 70 mg/dL). If your blood sugar is not greater than 70 mg/dL on recheck, call [phone] for further instructions. . Report your blood sugar to the short stay nurse when you get to Short Stay.  . If you are admitted to the hospital after surgery: o Your blood sugar will be checked by the staff and you will probably be given insulin after surgery (instead of oral diabetes medicines) to make sure you have good blood sugar levels. o The goal for blood sugar control after surgery is 80-180 mg/dL.  Do not wear jewelry            Do not wear lotions, powders, colognes, or deodorant.            Men may shave face and neck.            Do not bring valuables to the hospital.            [facility] is not responsible for any belongings or valuables.  Do NOT Smoke (Tobacco/Vaping) or drink Alcohol 24 hours prior to your procedure  If you use a CPAP at night, you may bring all equipment for your overnight stay.   Contacts, glasses, hearing aids, dentures or partials may not be worn into surgery.      For patients admitted to the hospital, discharge time will be  determined by your treatment team.   Patients discharged the day of surgery will not be allowed to drive home, and someone needs to stay with them for 24 hours.    Special instructions:   - Preparing For Surgery  Before surgery, you can play an important role. Because skin is not sterile, your skin needs to be as free of germs as possible. You can reduce the number of germs on your skin by washing with CHG (chlorahexidine gluconate) Soap before surgery.  CHG is an antiseptic cleaner which kills germs and bonds with the skin to continue killing germs even after washing.    Oral Hygiene is also important to reduce your risk of infection.  Remember - BRUSH YOUR TEETH THE MORNING OF SURGERY WITH YOUR REGULAR TOOTHPASTE  Please do not use if you have an allergy to CHG or antibacterial soaps. If your skin becomes reddened/irritated stop using the CHG.  Do not shave (including legs and underarms) for at least 48 hours prior to first CHG shower. It is OK to shave your face.  Please follow these instructions carefully.   1. Shower the NIGHT BEFORE SURGERY and the MORNING OF SURGERY with CHG Soap.   2. If you chose to wash your hair, wash your hair first as usual with your normal shampoo.  3. After you shampoo, rinse your hair and body thoroughly to remove the shampoo.  4. Use CHG as you would any other liquid soap. You can apply CHG directly to the skin and wash gently with a scrungie or a clean washcloth.   5. Apply the CHG Soap to your body ONLY FROM THE NECK DOWN.  Do not use on open wounds or open sores. Avoid contact with your eyes, ears, mouth and genitals (private parts). Wash Face and genitals (private parts)  with your normal soap.   6. Wash thoroughly, paying special attention to the area where your surgery will be performed.  7. Thoroughly rinse your body with warm water from the neck down.  8. DO NOT shower/wash with your normal soap after using and rinsing off the CHG  Soap.  9. Pat yourself dry with a CLEAN TOWEL.  10. Wear CLEAN PAJAMAS to bed the night before surgery  11. Place CLEAN SHEETS on your bed the night of your first shower and DO NOT SLEEP WITH PETS.   Day of Surgery: Shower with CHG soap as directed. Wear Clean/Comfortable clothing the morning of surgery. Do not apply any deodorants/lotions.   Remember to brush your teeth WITH YOUR REGULAR TOOTHPASTE.   Please read over the following fact sheets that you were given.

## 2020-09-16 ENCOUNTER — Inpatient Hospital Stay (HOSPITAL_COMMUNITY): Payer: Medicare Other

## 2020-09-16 ENCOUNTER — Other Ambulatory Visit: Payer: Self-pay | Admitting: *Deleted

## 2020-09-16 ENCOUNTER — Ambulatory Visit (HOSPITAL_COMMUNITY)
Admission: RE | Admit: 2020-09-16 | Discharge: 2020-09-16 | Disposition: A | Discharge status: Home / Self Care | Payer: Medicare Other | Attending: Thoracic Surgery (Cardiothoracic Vascular Surgery) | Admitting: Thoracic Surgery (Cardiothoracic Vascular Surgery)

## 2020-09-16 ENCOUNTER — Encounter (HOSPITAL_COMMUNITY): Payer: Self-pay | Admitting: Thoracic Surgery (Cardiothoracic Vascular Surgery)

## 2020-09-16 ENCOUNTER — Telehealth: Payer: Self-pay | Admitting: Cardiology

## 2020-09-16 ENCOUNTER — Other Ambulatory Visit (HOSPITAL_COMMUNITY): Payer: Medicare Other

## 2020-09-16 ENCOUNTER — Encounter (HOSPITAL_COMMUNITY)
Admission: RE | Disposition: A | Discharge status: Home / Self Care | Payer: Self-pay | Source: Home / Self Care | Attending: Thoracic Surgery (Cardiothoracic Vascular Surgery)

## 2020-09-16 ENCOUNTER — Other Ambulatory Visit: Payer: Self-pay

## 2020-09-16 DIAGNOSIS — I2510 Atherosclerotic heart disease of native coronary artery without angina pectoris: Secondary | ICD-10-CM | POA: Diagnosis not present

## 2020-09-16 DIAGNOSIS — Z20822 Contact with and (suspected) exposure to covid-19: Secondary | ICD-10-CM | POA: Insufficient documentation

## 2020-09-16 LAB — SARS CORONAVIRUS 2 BY RT PCR (HOSPITAL ORDER, PERFORMED IN ~~LOC~~ HOSPITAL LAB): SARS Coronavirus 2: NEGATIVE

## 2020-09-16 LAB — GLUCOSE, CAPILLARY: Glucose-Capillary: 142 mg/dL — ABNORMAL HIGH (ref 70–99)

## 2020-09-16 SURGERY — CORONARY ARTERY BYPASS GRAFTING (CABG)
Anesthesia: General | Site: Chest

## 2020-09-16 MED ORDER — MIDAZOLAM HCL 5 MG/5ML IJ SOLN
INTRAMUSCULAR | Status: AC | PRN
  Administered 2020-09-16 (×2): 1 mg via INTRAVENOUS

## 2020-09-16 MED ORDER — FENTANYL CITRATE (PF) 250 MCG/5ML IJ SOLN
INTRAMUSCULAR | Status: AC
  Filled 2020-09-16: qty 25

## 2020-09-16 MED ORDER — CHLORHEXIDINE GLUCONATE 4 % EX LIQD: 30.0000 mL | CUTANEOUS | Status: DC

## 2020-09-16 MED ORDER — CHLORHEXIDINE GLUCONATE 0.12 % MT SOLN
15.0000 mL | Freq: Once | OROMUCOSAL | Status: AC
  Administered 2020-09-16: 15 mL via OROMUCOSAL
  Filled 2020-09-16: qty 15

## 2020-09-16 MED ORDER — LACTATED RINGERS IV SOLN: INTRAVENOUS | Status: AC | PRN

## 2020-09-16 MED ORDER — FENTANYL CITRATE (PF) 250 MCG/5ML IJ SOLN
INTRAMUSCULAR | Status: AC | PRN
  Administered 2020-09-16: 50 ug via INTRAVENOUS

## 2020-09-16 MED ORDER — METOPROLOL TARTRATE 12.5 MG HALF TABLET
12.5000 mg | ORAL_TABLET | Freq: Once | ORAL | Status: DC
  Filled 2020-09-16: qty 1

## 2020-09-16 MED ORDER — PROPOFOL 10 MG/ML IV BOLUS
INTRAVENOUS | Status: AC
  Filled 2020-09-16: qty 20

## 2020-09-16 MED ORDER — MIDAZOLAM HCL (PF) 10 MG/2ML IJ SOLN
INTRAMUSCULAR | Status: AC
  Filled 2020-09-16: qty 2

## 2020-09-16 NOTE — Telephone Encounter (Signed)
Andee Poles is calling stating Dr. Kipp Brood canceled Juan Hatfield's surgery today after he was already on the table due to stating he wasn't aware [name] is Jehovah witness and the operating room not being setup for it. Andee Poles was upset by this due to stating she had this conversation with him prior to today making him aware as well as everyone she spoke with in regards to this surgery. She states even the nurses stated it was written in his paperwork. Andee Poles states he advised her he could not have it performed today because he had already been on the table to long so it would have to be rescheduled for another day. This is the second time he has canceled his surgery and they wish to receive a new referral for a surgeon because they no longer want [name] to receive care from Dr. Kipp Brood. Please advise.

## 2020-09-16 NOTE — Progress Notes (Signed)
See Dr. Abran Duke note 09/16/20 @ 4098. Pt surgery is cancelled, Dr. Kipp Brood has spoken to both pt and pt's daughter Azariah Bonura both have understanding of situation. Pt VSS, has dressed himself, all lines have been removed by CRNA. Pressure dressing placed by CRNA at a-line site. No bleeding observed at discharge. Pt will be transported out via wheelchair to daughter's vehicle.

## 2020-09-16 NOTE — Telephone Encounter (Signed)
Spoke with the patient's daughter in regards to her concerns below. The patient's surgery was cancelled yesterday due to an emergency surgery that the surgeon had to attend to.  The surgery was rescheduled for today, however after the patient was already prepped and on the table she states that Dr. Kipp Brood cancelled the surgery because he did not know that the patient was a Jehovah Witness and the OR was not prepped for it. The daughter is requesting another surgeon to perform her dad's surgery. I advised the daughter to reach out to [facility].

## 2020-09-16 NOTE — Anesthesia Procedure Notes (Deleted)
Date/Time: 09/16/2020 8:00 AM Performed by: Colin Benton, CRNA

## 2020-09-16 NOTE — Anesthesia Procedure Notes (Signed)
Arterial Line Insertion Start/End2/09/2020 7:55 AM, 09/16/2020 8:00 AM Performed by: Colin Benton, CRNA, CRNA  Patient location: Pre-op. Preanesthetic checklist: patient identified, IV checked, site marked, risks and benefits discussed, surgical consent, monitors and equipment checked, pre-op evaluation, timeout performed and anesthesia consent Lidocaine 1% used for infiltration Left, radial was placed Catheter size: 20 G Hand hygiene performed  and maximum sterile barriers used  Allen's test indicative of satisfactory collateral circulation Attempts: 1 Procedure performed without using ultrasound guided technique. Following insertion, dressing applied and Biopatch. Post procedure assessment: normal and unchanged  Patient tolerated the procedure well with no immediate complications.

## 2020-09-16 NOTE — Progress Notes (Signed)
     [address]       [place], [address]             [phone]       Pt is a Jehova's witness.  This was never discussed in our clinic appointment.  It was my oversight for missing this in his previous clinical records.    Case will be cancelled for now.  I will see him in clinic to discuss potential transfusion alternatives.    Jaren Vanetten Bary Leriche

## 2020-09-17 ENCOUNTER — Institutional Professional Consult (permissible substitution): Payer: Medicare Other | Admitting: Cardiothoracic Surgery

## 2020-09-17 ENCOUNTER — Encounter: Payer: Self-pay | Admitting: Cardiothoracic Surgery

## 2020-09-17 ENCOUNTER — Other Ambulatory Visit: Payer: Self-pay | Admitting: *Deleted

## 2020-09-17 VITALS — BP 155/87 | HR 80 | Resp 20 | Ht 66.0 in

## 2020-09-17 DIAGNOSIS — I2510 Atherosclerotic heart disease of native coronary artery without angina pectoris: Secondary | ICD-10-CM

## 2020-09-17 NOTE — Progress Notes (Signed)
[address]       [place] [address]             [phone]     CARDIOTHORACIC SURGERY CONSULTATION REPORT  Referring Provider is Sueanne Margarita, MD Primary Cardiologist is Fransico Him, MD PCP is Patient, No Pcp Per  Chief Complaint  Patient presents with  . Routine Post Op  . Coronary Artery Disease    Surgical consult  Indigestion and shortness of breath with exertion, documented severe coronary artery disease  HPI:  75 year old man with a history of diabetes (hemoglobin A1c 6),  Hypertension, and renal insufficiency noted indigestion shortly after yard work this past fall.  He presented to Dr. Radford Pax with these findings who ordered nuclear stress test, which was positive. This was followed by [facility] 07/20/20 showing severe multivessel CAD. He was referred for CABG but wanted to wait until Jan for surgery. He has been relatively stable with sx for past few weeks on medical therapy. His social hx is notable for Jehovah Witness belief system and will accept autologous blood donation including Cell Saver.   Past Medical History:  Diagnosis Date  . 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 Diabetes mellitus ([place])   . Hypertension   . Patient is Jehovah's Witness   . Renal insufficiency   . Sleep apnea    CPAP nightly    Past Surgical History:  Procedure Laterality Date  . BACK SURGERY    . CARDIAC CATHETERIZATION    . COLONOSCOPY    . COLONOSCOPY WITH PROPOFOL N/A 04/02/2019   Procedure: COLONOSCOPY WITH PROPOFOL;  Surgeon: Wilford Corner, MD;  Location: [facility];  Service: Endoscopy;  Laterality: N/A;  . LEFT HEART CATH AND CORONARY ANGIOGRAPHY N/A 07/20/2020   Procedure: LEFT HEART CATH AND CORONARY ANGIOGRAPHY;  Surgeon: Lorretta Harp, MD;  Location: [facility] CV LAB;  Service: Cardiovascular;  Laterality: N/A;  . LUMBAR LAMINECTOMY/DECOMPRESSION MICRODISCECTOMY Right 01/01/2015   Procedure: LUMBAR LAMINECTOMY/DECOMPRESSION  MICRODISCECTOMY RIGHT LUMBAR FIVE SACRAL ONE;  Surgeon: Jovita Gamma, MD;  Location: [facility] OR[facility];  Service: Neurosurgery;  Laterality: Right;  . POLYPECTOMY  04/02/2019   Procedure: POLYPECTOMY;  Surgeon: Wilford Corner, MD;  Location: [facility];  Service: Endoscopy;;    Family History  Problem Relation Age of Onset  . Cholecystitis Mother   . Lung disease Father   . CAD Brother     Social History   Socioeconomic History  . Marital status: Widowed    Spouse name: Not on file  . Number of children: Not on file  . Years of education: Not on file  . Highest education level: Not on file  Occupational History  . Not on file  Tobacco Use  . Smoking status: Never Smoker  . Smokeless tobacco: Never Used  Vaping Use  . Vaping Use: Never used  Substance and Sexual Activity  . Alcohol use: Not Currently  . Drug use: Never  . Sexual activity: Not on file  Other Topics Concern  . Not on file  Social History Narrative  . Not on file   Social Determinants of Health   Financial Resource Strain: Not on file  Food Insecurity: Not on file  Transportation Needs: Not on file  Physical Activity: Not on file  Stress: Not on file  Social Connections: Not on file  Intimate Partner Violence: Not on file    Current Outpatient Medications  Medication Sig Dispense Refill  .  aspirin 81 MG tablet Take 81 mg by mouth daily.    . carvedilol (COREG) 3.125 MG tablet Take 1 tablet (3.125 mg total) by mouth 2 (two) times daily. 60 tablet 1  . Coenzyme Q10 (CO Q 10) 100 MG CAPS Take 100 mg by mouth daily.     . isosorbide mononitrate (IMDUR) 30 MG 24 hr tablet Take 1 tablet (30 mg total) by mouth daily. 30 tablet 0  . LEVEMIR FLEXTOUCH 100 UNIT/ML FlexPen Inject 30 Units into the skin daily.     Marland Kitchen losartan (COZAAR) 25 MG tablet Take 25 mg by mouth daily.    . Magnesium 250 MG TABS Take 250 mg by mouth daily.    . milk thistle 175 MG tablet Take 175 mg by mouth daily.    . Multiple  Vitamins-Minerals (ALIVE ENERGY 50+) TABS Take 1 tablet by mouth daily.    . nitroGLYCERIN (NITROSTAT) 0.4 MG SL tablet Place 1 tablet (0.4 mg total) under the tongue every 5 (five) minutes as needed for chest pain (up to 3 doses). 25 tablet 0  . Omega-3 Fatty Acids (FISH OIL) 1000 MG CAPS Take 1,000 mg by mouth daily.    . rosuvastatin (CRESTOR) 5 MG tablet Take 5 mg by mouth daily.    . sitaGLIPtin-metformin (JANUMET) 50-1000 MG per tablet Take 1 tablet by mouth 2 (two) times daily with a meal.     Current Facility-Administered Medications  Medication Dose Route Frequency Provider Last Rate Last Admin  . sodium chloride flush (NS) 0.9 % injection 3 mL  3 mL Intravenous Q12H Dunn, Dayna N, PA-C       Facility-Administered Medications Ordered in Other Visits  Medication Dose Route Frequency Provider Last Rate Last Admin  . fentaNYL citrate (PF) (SUBLIMAZE) injection   Intravenous Anesthesia Intra-op Everlean Cherry A, CRNA   50 mcg at 09/16/20 0756  . lactated ringers infusion   Intravenous Continuous PRN Colin Benton, CRNA   New Bag at 09/16/20 [phone]  . midazolam (VERSED) 5 MG/5ML injection   Intravenous Anesthesia Intra-op Everlean Cherry A, CRNA   1 mg at 09/16/20 4854    Allergies  Allergen Reactions  . Other     No blood or blood products      Review of Systems:   General:  Mildly reduced energy level and no weight change  Cardiac:  Endorses occasional palpitations  Respiratory:  Some shortness of breath with exertion  GI:   Does report frequent heartburn,   GU:   Mild renal insufficiency baseline creatinine 1.5  Vascular:  Negative  Neuro:   No history of stroke or TIAs  Musculoskeletal: Negative  Skin:   Negative  Psych:   Negative  Eyes:   Negative  ENT:    last saw dentist 3 years ago  Hematologic:  Negative  Endocrine:  Positive diabetes, sugars well controlled     Physical Exam:   BP (!) 155/87 (BP Location: Left Arm, Patient Position: Sitting)   Pulse 80    Resp 20   Ht 5\' 6"  (1.676 m)   SpO2 98% Comment: RA  BMI 38.29 kg/m   General:    well-appearing  HEENT:  Unremarkable   Neck:   no JVD, no bruits, no adenopathy   Chest:   clear to auscultation, symmetrical breath sounds, no wheezes, no rhonchi   CV:   RRR, no  murmur   Abdomen:  soft, non-tender, no masses   Extremities:  warm, well-perfused, pulses intact, no  LE edema  Rectal/GU  Deferred  Neuro:   Grossly non-focal and symmetrical throughout  Skin:   Clean and dry, no rashes, no breakdown   Diagnostic Tests:  I have personally reviewed available imaging studies including echocardiogram from 09/11/2020 and left heart catheterization from 07/20/2020.  I agree with the interpretation provided.  Impression:  75 year old man with chronic stable ischemic heart disease.  He is a good candidate for surgery for myocardial revascularization   Plan:  CABG on 09/22/2020. It will be imperative to practice assiduous blood conservation given his social history and wishes.    I spent in excess of 30 minutes during the conduct of this office consultation and >50% of this time involved direct face-to-face encounter with the patient for counseling and/or coordination of their care.          Level 3 Office Consult = 40 minutes         Level 4 Office Consult = 60 minutes         Level 5 Office Consult = 80 minutes  B.  Murvin Natal, MD 09/17/2020 4:02 PM

## 2020-09-18 ENCOUNTER — Ambulatory Visit: Payer: Medicare Other | Admitting: Thoracic Surgery (Cardiothoracic Vascular Surgery)

## 2020-09-18 ENCOUNTER — Other Ambulatory Visit: Payer: Self-pay | Admitting: *Deleted

## 2020-09-18 DIAGNOSIS — I2510 Atherosclerotic heart disease of native coronary artery without angina pectoris: Secondary | ICD-10-CM

## 2020-09-19 ENCOUNTER — Other Ambulatory Visit (HOSPITAL_COMMUNITY)
Admission: RE | Admit: 2020-09-19 | Discharge: 2020-09-19 | Disposition: A | Discharge status: Home / Self Care | Payer: Medicare Other | Source: Ambulatory Visit | Attending: Cardiothoracic Surgery | Admitting: Cardiothoracic Surgery

## 2020-09-19 DIAGNOSIS — Z20822 Contact with and (suspected) exposure to covid-19: Secondary | ICD-10-CM | POA: Insufficient documentation

## 2020-09-19 DIAGNOSIS — Z01812 Encounter for preprocedural laboratory examination: Secondary | ICD-10-CM | POA: Insufficient documentation

## 2020-09-19 LAB — SARS CORONAVIRUS 2 (TAT 6-24 HRS): SARS Coronavirus 2: NEGATIVE

## 2020-09-21 MED ORDER — MAGNESIUM SULFATE 50 % IJ SOLN
40.0000 meq | INTRAMUSCULAR | Status: DC
  Filled 2020-09-21: qty 9.85

## 2020-09-21 MED ORDER — MILRINONE LACTATE IN DEXTROSE 20-5 MG/100ML-% IV SOLN
0.3000 ug/kg/min | INTRAVENOUS | Status: AC
  Administered 2020-09-22: .25 ug/kg/min via INTRAVENOUS
  Filled 2020-09-21: qty 100

## 2020-09-21 MED ORDER — SODIUM CHLORIDE 0.9 % IV SOLN
1.5000 g | INTRAVENOUS | Status: AC
  Administered 2020-09-22: 1.5 g via INTRAVENOUS
  Filled 2020-09-21: qty 1.5

## 2020-09-21 MED ORDER — POTASSIUM CHLORIDE 2 MEQ/ML IV SOLN
80.0000 meq | INTRAVENOUS | Status: DC
  Filled 2020-09-21: qty 40

## 2020-09-21 MED ORDER — TRANEXAMIC ACID (OHS) BOLUS VIA INFUSION
15.0000 mg/kg | INTRAVENOUS | Status: AC
  Administered 2020-09-22: 1614 mg via INTRAVENOUS
  Filled 2020-09-21: qty 1614

## 2020-09-21 MED ORDER — NOREPINEPHRINE 4 MG/250ML-% IV SOLN
0.0000 ug/min | INTRAVENOUS | Status: DC
  Filled 2020-09-21: qty 250

## 2020-09-21 MED ORDER — EPINEPHRINE HCL 5 MG/250ML IV SOLN IN NS
0.0000 ug/min | INTRAVENOUS | Status: DC
  Filled 2020-09-21: qty 250

## 2020-09-21 MED ORDER — SODIUM CHLORIDE 0.9 % IV SOLN
750.0000 mg | INTRAVENOUS | Status: AC
  Administered 2020-09-22: 750 mg via INTRAVENOUS
  Filled 2020-09-21: qty 750

## 2020-09-21 MED ORDER — DEXMEDETOMIDINE HCL IN NACL 400 MCG/100ML IV SOLN
0.1000 ug/kg/h | INTRAVENOUS | Status: AC
  Administered 2020-09-22: .7 ug/kg/h via INTRAVENOUS
  Filled 2020-09-21: qty 100

## 2020-09-21 MED ORDER — PLASMA-LYTE 148 IV SOLN
INTRAVENOUS | Status: DC
  Filled 2020-09-21: qty 2.5

## 2020-09-21 MED ORDER — PHENYLEPHRINE HCL-NACL 20-0.9 MG/250ML-% IV SOLN
30.0000 ug/min | INTRAVENOUS | Status: AC
  Administered 2020-09-22: 50 ug/min via INTRAVENOUS
  Filled 2020-09-21: qty 250

## 2020-09-21 MED ORDER — SODIUM CHLORIDE 0.9 % IV SOLN
INTRAVENOUS | Status: DC
  Filled 2020-09-21: qty 30

## 2020-09-21 MED ORDER — NITROGLYCERIN IN D5W 200-5 MCG/ML-% IV SOLN
2.0000 ug/min | INTRAVENOUS | Status: AC
  Administered 2020-09-22: 16.6 ug/min via INTRAVENOUS
  Filled 2020-09-21: qty 250

## 2020-09-21 MED ORDER — INSULIN REGULAR(HUMAN) IN NACL 100-0.9 UT/100ML-% IV SOLN
INTRAVENOUS | Status: AC
  Administered 2020-09-22: 3.4 [IU]/h via INTRAVENOUS
  Filled 2020-09-21: qty 100

## 2020-09-21 MED ORDER — TRANEXAMIC ACID (OHS) PUMP PRIME SOLUTION
2.0000 mg/kg | INTRAVENOUS | Status: DC
  Filled 2020-09-21: qty 2.15

## 2020-09-21 MED ORDER — TRANEXAMIC ACID 1000 MG/10ML IV SOLN
1.5000 mg/kg/h | INTRAVENOUS | Status: AC
  Administered 2020-09-22: 1.5 mg/kg/h via INTRAVENOUS
  Filled 2020-09-21: qty 25

## 2020-09-21 MED ORDER — VANCOMYCIN HCL 1500 MG/300ML IV SOLN
1500.0000 mg | INTRAVENOUS | Status: AC
  Administered 2020-09-22: 1500 mg via INTRAVENOUS
  Filled 2020-09-21: qty 300

## 2020-09-21 NOTE — Progress Notes (Addendum)
I spoke with Mr.Littlepage and confirmed arrival time on 09/22/20. Mr. Gordillo reports that there has been no change in medical history or medication since he was here, 09/16/20.

## 2020-09-21 NOTE — Anesthesia Preprocedure Evaluation (Addendum)
Anesthesia Evaluation  Patient identified by MRN, date of birth, ID band Patient awake    Reviewed: Allergy & Precautions, NPO status , Patient's Chart, lab work & pertinent test results, reviewed documented beta blocker date and time   Airway Mallampati: II  TM Distance: >3 FB Neck ROM: Full    Dental  (+) Edentulous Upper, Edentulous Lower, Dental Advisory Given   Pulmonary sleep apnea ,    Pulmonary exam normal breath sounds clear to auscultation       Cardiovascular hypertension, Pt. on home beta blockers and Pt. on medications + CAD  Normal cardiovascular exam Rhythm:Regular Rate:Normal  Echo 09/11/2020 1. Left ventricular ejection fraction, by estimation, is 60 to 65%. The left ventricle has normal function. The left ventricle has no regional wall motion abnormalities. Left ventricular diastolic parameters are consistent with Grade I diastolic dysfunction (impaired relaxation). Elevated left ventricular end-diastolic pressure.  2. Right ventricular systolic function is normal. The right ventricular size is normal. There is normal pulmonary artery systolic pressure.  3. The mitral valve is normal in structure. Trivial mitral valve regurgitation. No evidence of mitral stenosis.  4. The aortic valve is normal in structure. Aortic valve regurgitation is not visualized. No aortic stenosis is present.  5. The inferior vena cava is normal in size with greater than 50% respiratory variability, suggesting right atrial pressure of 3 mmHg.    Neuro/Psych negative neurological ROS     GI/Hepatic negative GI ROS, Neg liver ROS,   Endo/Other  diabetes  Renal/GU Renal disease     Musculoskeletal   Abdominal (+) + obese,   Peds  Hematology  (+) Blood dyscrasia, , REFUSES BLOOD PRODUCTS, JEHOVAH'S WITNESS  Anesthesia Other Findings   Reproductive/Obstetrics                            Anesthesia  Physical  Anesthesia Plan  ASA: IV  Anesthesia Plan: General   Post-op Pain Management:    Induction: Intravenous  PONV Risk Score and Plan: 2 and Midazolam  Airway Management Planned: Oral ETT  Additional Equipment: Arterial line, PA Cath, TEE, Ultrasound Guidance Line Placement and CVP  Intra-op Plan:   Post-operative Plan: Post-operative intubation/ventilation  Informed Consent: I have reviewed the patients History and Physical, chart, labs and discussed the procedure including the risks, benefits and alternatives for the proposed anesthesia with the patient or authorized representative who has indicated his/her understanding and acceptance.     Dental advisory given  Plan Discussed with: CRNA  Anesthesia Plan Comments: (PAT note written 09/14/2020 by Myra Gianotti, PA-C. He is a Restaurant manager, fast food. Pre-op CBC normal.  )       Anesthesia Quick Evaluation

## 2020-09-22 ENCOUNTER — Inpatient Hospital Stay (HOSPITAL_COMMUNITY): Payer: Medicare Other | Admitting: Certified Registered Nurse Anesthetist

## 2020-09-22 ENCOUNTER — Inpatient Hospital Stay (HOSPITAL_COMMUNITY): Payer: Medicare Other

## 2020-09-22 ENCOUNTER — Inpatient Hospital Stay (HOSPITAL_COMMUNITY)
Admission: RE | Admit: 2020-09-22 | Discharge: 2020-09-26 | DRG: 236 | Disposition: A | Discharge status: Home / Self Care | Payer: Medicare Other | Attending: Cardiothoracic Surgery | Admitting: Cardiothoracic Surgery

## 2020-09-22 ENCOUNTER — Other Ambulatory Visit: Payer: Self-pay

## 2020-09-22 ENCOUNTER — Encounter (HOSPITAL_COMMUNITY): Payer: Self-pay | Admitting: Cardiothoracic Surgery

## 2020-09-22 ENCOUNTER — Encounter: Payer: Medicare Other | Admitting: Cardiothoracic Surgery

## 2020-09-22 ENCOUNTER — Inpatient Hospital Stay (HOSPITAL_COMMUNITY)
Admission: RE | Disposition: A | Discharge status: Home / Self Care | Payer: Self-pay | Source: Home / Self Care | Attending: Cardiothoracic Surgery

## 2020-09-22 DIAGNOSIS — I10 Essential (primary) hypertension: Secondary | ICD-10-CM | POA: Diagnosis present

## 2020-09-22 DIAGNOSIS — E8770 Fluid overload, unspecified: Secondary | ICD-10-CM | POA: Diagnosis not present

## 2020-09-22 DIAGNOSIS — Z836 Family history of other diseases of the respiratory system: Secondary | ICD-10-CM | POA: Diagnosis not present

## 2020-09-22 DIAGNOSIS — Z20822 Contact with and (suspected) exposure to covid-19: Secondary | ICD-10-CM | POA: Diagnosis present

## 2020-09-22 DIAGNOSIS — I2510 Atherosclerotic heart disease of native coronary artery without angina pectoris: Principal | ICD-10-CM | POA: Diagnosis present

## 2020-09-22 DIAGNOSIS — Z951 Presence of aortocoronary bypass graft: Secondary | ICD-10-CM

## 2020-09-22 DIAGNOSIS — N289 Disorder of kidney and ureter, unspecified: Secondary | ICD-10-CM | POA: Diagnosis present

## 2020-09-22 DIAGNOSIS — Z8249 Family history of ischemic heart disease and other diseases of the circulatory system: Secondary | ICD-10-CM | POA: Diagnosis not present

## 2020-09-22 DIAGNOSIS — I25110 Atherosclerotic heart disease of native coronary artery with unstable angina pectoris: Secondary | ICD-10-CM

## 2020-09-22 DIAGNOSIS — E119 Type 2 diabetes mellitus without complications: Secondary | ICD-10-CM | POA: Diagnosis present

## 2020-09-22 DIAGNOSIS — Z9889 Other specified postprocedural states: Secondary | ICD-10-CM

## 2020-09-22 DIAGNOSIS — I440 Atrioventricular block, first degree: Secondary | ICD-10-CM | POA: Diagnosis not present

## 2020-09-22 DIAGNOSIS — G4730 Sleep apnea, unspecified: Secondary | ICD-10-CM | POA: Diagnosis present

## 2020-09-22 HISTORY — PX: TEE WITHOUT CARDIOVERSION: SHX5443

## 2020-09-22 HISTORY — PX: CORONARY ARTERY BYPASS GRAFT: SHX141

## 2020-09-22 HISTORY — PX: ENDOVEIN HARVEST OF GREATER SAPHENOUS VEIN: SHX5059

## 2020-09-22 LAB — POCT I-STAT 7, (LYTES, BLD GAS, ICA,H+H)
Acid-Base Excess: 5 mmol/L — ABNORMAL HIGH (ref 0.0–2.0)
Acid-Base Excess: 6 mmol/L — ABNORMAL HIGH (ref 0.0–2.0)
Acid-Base Excess: 5 mmol/L — ABNORMAL HIGH (ref 0.0–2.0)
Acid-Base Excess: 7 mmol/L — ABNORMAL HIGH (ref 0.0–2.0)
Acid-Base Excess: 2 mmol/L (ref 0.0–2.0)
Acid-base deficit: 2 mmol/L (ref 0.0–2.0)
Acid-base deficit: 3 mmol/L — ABNORMAL HIGH (ref 0.0–2.0)
Acid-base deficit: 4 mmol/L — ABNORMAL HIGH (ref 0.0–2.0)
Acid-base deficit: 4 mmol/L — ABNORMAL HIGH (ref 0.0–2.0)
Bicarbonate: 30.4 mmol/L — ABNORMAL HIGH (ref 20.0–28.0)
Bicarbonate: 31.1 mmol/L — ABNORMAL HIGH (ref 20.0–28.0)
Bicarbonate: 29.4 mmol/L — ABNORMAL HIGH (ref 20.0–28.0)
Bicarbonate: 30.7 mmol/L — ABNORMAL HIGH (ref 20.0–28.0)
Bicarbonate: 28.3 mmol/L — ABNORMAL HIGH (ref 20.0–28.0)
Bicarbonate: 25.4 mmol/L (ref 20.0–28.0)
Bicarbonate: 23.7 mmol/L (ref 20.0–28.0)
Bicarbonate: 23.1 mmol/L (ref 20.0–28.0)
Bicarbonate: 22.7 mmol/L (ref 20.0–28.0)
Calcium, Ion: 1.23 mmol/L (ref 1.15–1.40)
Calcium, Ion: 1.06 mmol/L — ABNORMAL LOW (ref 1.15–1.40)
Calcium, Ion: 1.09 mmol/L — ABNORMAL LOW (ref 1.15–1.40)
Calcium, Ion: 1.1 mmol/L — ABNORMAL LOW (ref 1.15–1.40)
Calcium, Ion: 1.14 mmol/L — ABNORMAL LOW (ref 1.15–1.40)
Calcium, Ion: 1.19 mmol/L (ref 1.15–1.40)
Calcium, Ion: 1.16 mmol/L (ref 1.15–1.40)
Calcium, Ion: 1.18 mmol/L (ref 1.15–1.40)
Calcium, Ion: 1.15 mmol/L (ref 1.15–1.40)
HCT: 39 % (ref 39.0–52.0)
HCT: 32 % — ABNORMAL LOW (ref 39.0–52.0)
HCT: 33 % — ABNORMAL LOW (ref 39.0–52.0)
HCT: 33 % — ABNORMAL LOW (ref 39.0–52.0)
HCT: 39 % (ref 39.0–52.0)
HCT: 42 % (ref 39.0–52.0)
HCT: 43 % (ref 39.0–52.0)
HCT: 44 % (ref 39.0–52.0)
HCT: 41 % (ref 39.0–52.0)
Hemoglobin: 13.3 g/dL (ref 13.0–17.0)
Hemoglobin: 10.9 g/dL — ABNORMAL LOW (ref 13.0–17.0)
Hemoglobin: 11.2 g/dL — ABNORMAL LOW (ref 13.0–17.0)
Hemoglobin: 11.2 g/dL — ABNORMAL LOW (ref 13.0–17.0)
Hemoglobin: 13.3 g/dL (ref 13.0–17.0)
Hemoglobin: 14.3 g/dL (ref 13.0–17.0)
Hemoglobin: 14.6 g/dL (ref 13.0–17.0)
Hemoglobin: 15 g/dL (ref 13.0–17.0)
Hemoglobin: 13.9 g/dL (ref 13.0–17.0)
O2 Saturation: 100 %
O2 Saturation: 100 %
O2 Saturation: 100 %
O2 Saturation: 100 %
O2 Saturation: 97 %
O2 Saturation: 97 %
O2 Saturation: 98 %
O2 Saturation: 98 %
O2 Saturation: 98 %
Patient temperature: 35.7
Patient temperature: 37.4
Patient temperature: 37.6
Patient temperature: 37.5
Patient temperature: 37.4
Potassium: 4 mmol/L (ref 3.5–5.1)
Potassium: 4.2 mmol/L (ref 3.5–5.1)
Potassium: 5 mmol/L (ref 3.5–5.1)
Potassium: 5.7 mmol/L — ABNORMAL HIGH (ref 3.5–5.1)
Potassium: 4.2 mmol/L (ref 3.5–5.1)
Potassium: 4.7 mmol/L (ref 3.5–5.1)
Potassium: 4.4 mmol/L (ref 3.5–5.1)
Potassium: 4.3 mmol/L (ref 3.5–5.1)
Potassium: 4.1 mmol/L (ref 3.5–5.1)
Sodium: 142 mmol/L (ref 135–145)
Sodium: 142 mmol/L (ref 135–145)
Sodium: 140 mmol/L (ref 135–145)
Sodium: 140 mmol/L (ref 135–145)
Sodium: 142 mmol/L (ref 135–145)
Sodium: 139 mmol/L (ref 135–145)
Sodium: 138 mmol/L (ref 135–145)
Sodium: 141 mmol/L (ref 135–145)
Sodium: 141 mmol/L (ref 135–145)
TCO2: 32 mmol/L (ref 22–32)
TCO2: 32 mmol/L (ref 22–32)
TCO2: 31 mmol/L (ref 22–32)
TCO2: 32 mmol/L (ref 22–32)
TCO2: 30 mmol/L (ref 22–32)
TCO2: 27 mmol/L (ref 22–32)
TCO2: 25 mmol/L (ref 22–32)
TCO2: 25 mmol/L (ref 22–32)
TCO2: 24 mmol/L (ref 22–32)
pCO2 arterial: 49.1 mmHg — ABNORMAL HIGH (ref 32.0–48.0)
pCO2 arterial: 46.2 mmHg (ref 32.0–48.0)
pCO2 arterial: 39.3 mmHg (ref 32.0–48.0)
pCO2 arterial: 40 mmHg (ref 32.0–48.0)
pCO2 arterial: 47.5 mmHg (ref 32.0–48.0)
pCO2 arterial: 52.2 mmHg — ABNORMAL HIGH (ref 32.0–48.0)
pCO2 arterial: 49.9 mmHg — ABNORMAL HIGH (ref 32.0–48.0)
pCO2 arterial: 49.2 mmHg — ABNORMAL HIGH (ref 32.0–48.0)
pCO2 arterial: 47.9 mmHg (ref 32.0–48.0)
pH, Arterial: 7.4 (ref 7.350–7.450)
pH, Arterial: 7.436 (ref 7.350–7.450)
pH, Arterial: 7.474 — ABNORMAL HIGH (ref 7.350–7.450)
pH, Arterial: 7.502 — ABNORMAL HIGH (ref 7.350–7.450)
pH, Arterial: 7.378 (ref 7.350–7.450)
pH, Arterial: 7.297 — ABNORMAL LOW (ref 7.350–7.450)
pH, Arterial: 7.288 — ABNORMAL LOW (ref 7.350–7.450)
pH, Arterial: 7.282 — ABNORMAL LOW (ref 7.350–7.450)
pH, Arterial: 7.287 — ABNORMAL LOW (ref 7.350–7.450)
pO2, Arterial: 374 mmHg — ABNORMAL HIGH (ref 83.0–108.0)
pO2, Arterial: 372 mmHg — ABNORMAL HIGH (ref 83.0–108.0)
pO2, Arterial: 282 mmHg — ABNORMAL HIGH (ref 83.0–108.0)
pO2, Arterial: 326 mmHg — ABNORMAL HIGH (ref 83.0–108.0)
pO2, Arterial: 91 mmHg (ref 83.0–108.0)
pO2, Arterial: 109 mmHg — ABNORMAL HIGH (ref 83.0–108.0)
pO2, Arterial: 113 mmHg — ABNORMAL HIGH (ref 83.0–108.0)
pO2, Arterial: 124 mmHg — ABNORMAL HIGH (ref 83.0–108.0)
pO2, Arterial: 114 mmHg — ABNORMAL HIGH (ref 83.0–108.0)

## 2020-09-22 LAB — POCT I-STAT, CHEM 8
BUN: 17 mg/dL (ref 8–23)
BUN: 15 mg/dL (ref 8–23)
BUN: 15 mg/dL (ref 8–23)
BUN: 17 mg/dL (ref 8–23)
BUN: 16 mg/dL (ref 8–23)
BUN: 17 mg/dL (ref 8–23)
Calcium, Ion: 1.31 mmol/L (ref 1.15–1.40)
Calcium, Ion: 1.25 mmol/L (ref 1.15–1.40)
Calcium, Ion: 1.19 mmol/L (ref 1.15–1.40)
Calcium, Ion: 1.1 mmol/L — ABNORMAL LOW (ref 1.15–1.40)
Calcium, Ion: 1.09 mmol/L — ABNORMAL LOW (ref 1.15–1.40)
Calcium, Ion: 1.12 mmol/L — ABNORMAL LOW (ref 1.15–1.40)
Chloride: 101 mmol/L (ref 98–111)
Chloride: 102 mmol/L (ref 98–111)
Chloride: 103 mmol/L (ref 98–111)
Chloride: 101 mmol/L (ref 98–111)
Chloride: 101 mmol/L (ref 98–111)
Chloride: 103 mmol/L (ref 98–111)
Creatinine, Ser: 1.6 mg/dL — ABNORMAL HIGH (ref 0.61–1.24)
Creatinine, Ser: 1.4 mg/dL — ABNORMAL HIGH (ref 0.61–1.24)
Creatinine, Ser: 1.4 mg/dL — ABNORMAL HIGH (ref 0.61–1.24)
Creatinine, Ser: 1.4 mg/dL — ABNORMAL HIGH (ref 0.61–1.24)
Creatinine, Ser: 1.3 mg/dL — ABNORMAL HIGH (ref 0.61–1.24)
Creatinine, Ser: 1.4 mg/dL — ABNORMAL HIGH (ref 0.61–1.24)
Glucose, Bld: 128 mg/dL — ABNORMAL HIGH (ref 70–99)
Glucose, Bld: 116 mg/dL — ABNORMAL HIGH (ref 70–99)
Glucose, Bld: 154 mg/dL — ABNORMAL HIGH (ref 70–99)
Glucose, Bld: 151 mg/dL — ABNORMAL HIGH (ref 70–99)
Glucose, Bld: 144 mg/dL — ABNORMAL HIGH (ref 70–99)
Glucose, Bld: 154 mg/dL — ABNORMAL HIGH (ref 70–99)
HCT: 42 % (ref 39.0–52.0)
HCT: 40 % (ref 39.0–52.0)
HCT: 38 % — ABNORMAL LOW (ref 39.0–52.0)
HCT: 33 % — ABNORMAL LOW (ref 39.0–52.0)
HCT: 32 % — ABNORMAL LOW (ref 39.0–52.0)
HCT: 33 % — ABNORMAL LOW (ref 39.0–52.0)
Hemoglobin: 14.3 g/dL (ref 13.0–17.0)
Hemoglobin: 13.6 g/dL (ref 13.0–17.0)
Hemoglobin: 12.9 g/dL — ABNORMAL LOW (ref 13.0–17.0)
Hemoglobin: 11.2 g/dL — ABNORMAL LOW (ref 13.0–17.0)
Hemoglobin: 10.9 g/dL — ABNORMAL LOW (ref 13.0–17.0)
Hemoglobin: 11.2 g/dL — ABNORMAL LOW (ref 13.0–17.0)
Potassium: 4 mmol/L (ref 3.5–5.1)
Potassium: 4 mmol/L (ref 3.5–5.1)
Potassium: 4.1 mmol/L (ref 3.5–5.1)
Potassium: 5.1 mmol/L (ref 3.5–5.1)
Potassium: 5.1 mmol/L (ref 3.5–5.1)
Potassium: 5.7 mmol/L — ABNORMAL HIGH (ref 3.5–5.1)
Sodium: 143 mmol/L (ref 135–145)
Sodium: 142 mmol/L (ref 135–145)
Sodium: 140 mmol/L (ref 135–145)
Sodium: 140 mmol/L (ref 135–145)
Sodium: 139 mmol/L (ref 135–145)
Sodium: 140 mmol/L (ref 135–145)
TCO2: 30 mmol/L (ref 22–32)
TCO2: 28 mmol/L (ref 22–32)
TCO2: 28 mmol/L (ref 22–32)
TCO2: 30 mmol/L (ref 22–32)
TCO2: 29 mmol/L (ref 22–32)
TCO2: 31 mmol/L (ref 22–32)

## 2020-09-22 LAB — CBC
HCT: 43.9 % (ref 39.0–52.0)
HCT: 40.2 % (ref 39.0–52.0)
HCT: 45.4 % (ref 39.0–52.0)
Hemoglobin: 13.1 g/dL (ref 13.0–17.0)
Hemoglobin: 12 g/dL — ABNORMAL LOW (ref 13.0–17.0)
Hemoglobin: 13.6 g/dL (ref 13.0–17.0)
MCH: 28.4 pg (ref 26.0–34.0)
MCH: 28.4 pg (ref 26.0–34.0)
MCH: 28.3 pg (ref 26.0–34.0)
MCHC: 29.8 g/dL — ABNORMAL LOW (ref 30.0–36.0)
MCHC: 29.9 g/dL — ABNORMAL LOW (ref 30.0–36.0)
MCHC: 30 g/dL (ref 30.0–36.0)
MCV: 95 fL (ref 80.0–100.0)
MCV: 95 fL (ref 80.0–100.0)
MCV: 94.4 fL (ref 80.0–100.0)
Platelets: 206 10*3/uL (ref 150–400)
Platelets: 146 10*3/uL — ABNORMAL LOW (ref 150–400)
Platelets: 235 10*3/uL (ref 150–400)
RBC: 4.62 MIL/uL (ref 4.22–5.81)
RBC: 4.23 MIL/uL (ref 4.22–5.81)
RBC: 4.81 MIL/uL (ref 4.22–5.81)
RDW: 11.5 % (ref 11.5–15.5)
RDW: 11.5 % (ref 11.5–15.5)
RDW: 11.3 % — ABNORMAL LOW (ref 11.5–15.5)
WBC: 6 10*3/uL (ref 4.0–10.5)
WBC: 8.4 10*3/uL (ref 4.0–10.5)
WBC: 16.2 10*3/uL — ABNORMAL HIGH (ref 4.0–10.5)
nRBC: 0 % (ref 0.0–0.2)
nRBC: 0 % (ref 0.0–0.2)
nRBC: 0 % (ref 0.0–0.2)

## 2020-09-22 LAB — GLUCOSE, CAPILLARY
Glucose-Capillary: 108 mg/dL — ABNORMAL HIGH (ref 70–99)
Glucose-Capillary: 112 mg/dL — ABNORMAL HIGH (ref 70–99)
Glucose-Capillary: 123 mg/dL — ABNORMAL HIGH (ref 70–99)
Glucose-Capillary: 127 mg/dL — ABNORMAL HIGH (ref 70–99)
Glucose-Capillary: 135 mg/dL — ABNORMAL HIGH (ref 70–99)
Glucose-Capillary: 143 mg/dL — ABNORMAL HIGH (ref 70–99)
Glucose-Capillary: 150 mg/dL — ABNORMAL HIGH (ref 70–99)
Glucose-Capillary: 154 mg/dL — ABNORMAL HIGH (ref 70–99)
Glucose-Capillary: 157 mg/dL — ABNORMAL HIGH (ref 70–99)
Glucose-Capillary: 164 mg/dL — ABNORMAL HIGH (ref 70–99)
Glucose-Capillary: 171 mg/dL — ABNORMAL HIGH (ref 70–99)
Glucose-Capillary: 186 mg/dL — ABNORMAL HIGH (ref 70–99)

## 2020-09-22 LAB — POCT I-STAT EG7
Acid-Base Excess: 5 mmol/L — ABNORMAL HIGH (ref 0.0–2.0)
Bicarbonate: 30.5 mmol/L — ABNORMAL HIGH (ref 20.0–28.0)
Calcium, Ion: 1.07 mmol/L — ABNORMAL LOW (ref 1.15–1.40)
HCT: 33 % — ABNORMAL LOW (ref 39.0–52.0)
Hemoglobin: 11.2 g/dL — ABNORMAL LOW (ref 13.0–17.0)
O2 Saturation: 78 %
Potassium: 5.3 mmol/L — ABNORMAL HIGH (ref 3.5–5.1)
Sodium: 141 mmol/L (ref 135–145)
TCO2: 32 mmol/L (ref 22–32)
pCO2, Ven: 49.2 mmHg (ref 44.0–60.0)
pH, Ven: 7.401 (ref 7.250–7.430)
pO2, Ven: 43 mmHg (ref 32.0–45.0)

## 2020-09-22 LAB — PROTIME-INR
INR: 1.4 — ABNORMAL HIGH (ref 0.8–1.2)
Prothrombin Time: 16.4 seconds — ABNORMAL HIGH (ref 11.4–15.2)

## 2020-09-22 LAB — BASIC METABOLIC PANEL
Anion gap: 11 (ref 5–15)
BUN: 14 mg/dL (ref 8–23)
CO2: 22 mmol/L (ref 22–32)
Calcium: 8.1 mg/dL — ABNORMAL LOW (ref 8.9–10.3)
Chloride: 105 mmol/L (ref 98–111)
Creatinine, Ser: 1.67 mg/dL — ABNORMAL HIGH (ref 0.61–1.24)
GFR, Estimated: 43 mL/min — ABNORMAL LOW (ref >60)
Glucose, Bld: 170 mg/dL — ABNORMAL HIGH (ref 70–99)
Potassium: 4.4 mmol/L (ref 3.5–5.1)
Sodium: 138 mmol/L (ref 135–145)

## 2020-09-22 LAB — ECHO INTRAOPERATIVE TEE
AV Mean grad: 3 mmHg
AV Peak grad: 5.8 mmHg
Ao pk vel: 1.2 m/s
Height: 66 in
Weight: 3760 oz

## 2020-09-22 LAB — COMPREHENSIVE METABOLIC PANEL
ALT: 16 U/L (ref 0–44)
AST: 19 U/L (ref 15–41)
Albumin: 3.5 g/dL (ref 3.5–5.0)
Alkaline Phosphatase: 65 U/L (ref 38–126)
Anion gap: 11 (ref 5–15)
BUN: 15 mg/dL (ref 8–23)
CO2: 26 mmol/L (ref 22–32)
Calcium: 9.1 mg/dL (ref 8.9–10.3)
Chloride: 104 mmol/L (ref 98–111)
Creatinine, Ser: 1.63 mg/dL — ABNORMAL HIGH (ref 0.61–1.24)
GFR, Estimated: 44 mL/min — ABNORMAL LOW (ref >60)
Glucose, Bld: 120 mg/dL — ABNORMAL HIGH (ref 70–99)
Potassium: 4 mmol/L (ref 3.5–5.1)
Sodium: 141 mmol/L (ref 135–145)
Total Bilirubin: 0.6 mg/dL (ref 0.3–1.2)
Total Protein: 6.1 g/dL — ABNORMAL LOW (ref 6.5–8.1)

## 2020-09-22 LAB — COOXEMETRY PANEL
Carboxyhemoglobin: 1.3 % (ref 0.5–1.5)
Methemoglobin: 1 % (ref 0.0–1.5)
O2 Saturation: 67.5 %
Total hemoglobin: 13.6 g/dL (ref 12.0–16.0)

## 2020-09-22 LAB — PLATELET COUNT: Platelets: 145 10*3/uL — ABNORMAL LOW (ref 150–400)

## 2020-09-22 LAB — HEMOGLOBIN AND HEMATOCRIT, BLOOD
HCT: 31.7 % — ABNORMAL LOW (ref 39.0–52.0)
Hemoglobin: 10.1 g/dL — ABNORMAL LOW (ref 13.0–17.0)

## 2020-09-22 LAB — APTT: aPTT: 35 seconds (ref 24–36)

## 2020-09-22 LAB — MAGNESIUM: Magnesium: 2.7 mg/dL — ABNORMAL HIGH (ref 1.7–2.4)

## 2020-09-22 SURGERY — CORONARY ARTERY BYPASS GRAFTING (CABG)
Anesthesia: General | Site: Leg Upper | Laterality: Right

## 2020-09-22 MED ORDER — INSULIN REGULAR(HUMAN) IN NACL 100-0.9 UT/100ML-% IV SOLN
INTRAVENOUS | Status: DC
  Administered 2020-09-23: 3.2 [IU]/h via INTRAVENOUS
  Filled 2020-09-22: qty 100

## 2020-09-22 MED ORDER — TRAMADOL HCL 50 MG PO TABS
50.0000 mg | ORAL_TABLET | ORAL | Status: DC | PRN
  Administered 2020-09-23 – 2020-09-24 (×3): 100 mg via ORAL
  Administered 2020-09-24 (×2): 50 mg via ORAL
  Administered 2020-09-26: 100 mg via ORAL
  Filled 2020-09-22: qty 2
  Filled 2020-09-22: qty 1
  Filled 2020-09-22: qty 2
  Filled 2020-09-22: qty 1
  Filled 2020-09-22 (×2): qty 2

## 2020-09-22 MED ORDER — 0.9 % SODIUM CHLORIDE (POUR BTL) OPTIME
TOPICAL | Status: DC | PRN
  Administered 2020-09-22: 5000 mL

## 2020-09-22 MED ORDER — FENTANYL CITRATE (PF) 250 MCG/5ML IJ SOLN
INTRAMUSCULAR | Status: AC
  Filled 2020-09-22: qty 5

## 2020-09-22 MED ORDER — HEPARIN SODIUM (PORCINE) 1000 UNIT/ML IJ SOLN
INTRAMUSCULAR | Status: DC | PRN
  Administered 2020-09-22: 38000 [IU] via INTRAVENOUS

## 2020-09-22 MED ORDER — PROPOFOL 10 MG/ML IV BOLUS
INTRAVENOUS | Status: AC
  Filled 2020-09-22: qty 20

## 2020-09-22 MED ORDER — PANTOPRAZOLE SODIUM 40 MG PO TBEC
40.0000 mg | DELAYED_RELEASE_TABLET | Freq: Every day | ORAL | Status: DC
  Administered 2020-09-24 – 2020-09-26 (×3): 40 mg via ORAL
  Filled 2020-09-22 (×3): qty 1

## 2020-09-22 MED ORDER — ALBUMIN HUMAN 5 % IV SOLN
250.0000 mL | INTRAVENOUS | Status: AC | PRN
Start: 2020-09-22 — End: 2020-09-23

## 2020-09-22 MED ORDER — BISACODYL 10 MG RE SUPP: 10.0000 mg | Freq: Every day | RECTAL | Status: DC

## 2020-09-22 MED ORDER — SODIUM CHLORIDE 0.9 % IV SOLN: INTRAVENOUS | Status: DC

## 2020-09-22 MED ORDER — CHLORHEXIDINE GLUCONATE CLOTH 2 % EX PADS
6.0000 | MEDICATED_PAD | Freq: Every day | CUTANEOUS | Status: DC
  Administered 2020-09-22 – 2020-09-23 (×2): 6 via TOPICAL

## 2020-09-22 MED ORDER — SODIUM CHLORIDE 0.9 % IV SOLN
1.5000 g | Freq: Two times a day (BID) | INTRAVENOUS | Status: AC
  Administered 2020-09-22 – 2020-09-24 (×4): 1.5 g via INTRAVENOUS
  Filled 2020-09-22 (×4): qty 1.5

## 2020-09-22 MED ORDER — SODIUM CHLORIDE 0.9 % IV SOLN: INTRAVENOUS | Status: DC | PRN

## 2020-09-22 MED ORDER — MAGNESIUM SULFATE 4 GM/100ML IV SOLN
4.0000 g | Freq: Once | INTRAVENOUS | Status: AC
  Administered 2020-09-22: 4 g via INTRAVENOUS

## 2020-09-22 MED ORDER — STERILE WATER FOR INJECTION IJ SOLN
INTRAMUSCULAR | Status: AC
  Filled 2020-09-22: qty 10

## 2020-09-22 MED ORDER — FENTANYL CITRATE (PF) 250 MCG/5ML IJ SOLN
INTRAMUSCULAR | Status: DC | PRN
  Administered 2020-09-22: 300 ug via INTRAVENOUS
  Administered 2020-09-22: 50 ug via INTRAVENOUS
  Administered 2020-09-22: 300 ug via INTRAVENOUS
  Administered 2020-09-22: 100 ug via INTRAVENOUS
  Administered 2020-09-22: 250 ug via INTRAVENOUS
  Administered 2020-09-22: 50 ug via INTRAVENOUS
  Administered 2020-09-22: 250 ug via INTRAVENOUS
  Administered 2020-09-22: 200 ug via INTRAVENOUS

## 2020-09-22 MED ORDER — ONDANSETRON HCL 4 MG/2ML IJ SOLN: 4.0000 mg | Freq: Four times a day (QID) | INTRAMUSCULAR | Status: DC | PRN

## 2020-09-22 MED ORDER — ROCURONIUM BROMIDE 10 MG/ML (PF) SYRINGE
PREFILLED_SYRINGE | INTRAVENOUS | Status: DC | PRN
  Administered 2020-09-22: 50 mg via INTRAVENOUS
  Administered 2020-09-22: 100 mg via INTRAVENOUS
  Administered 2020-09-22: 50 mg via INTRAVENOUS

## 2020-09-22 MED ORDER — SODIUM CHLORIDE 0.9% FLUSH
3.0000 mL | Freq: Two times a day (BID) | INTRAVENOUS | Status: DC
  Administered 2020-09-23: 3 mL via INTRAVENOUS

## 2020-09-22 MED ORDER — DOCUSATE SODIUM 100 MG PO CAPS
200.0000 mg | ORAL_CAPSULE | Freq: Every day | ORAL | Status: DC
  Administered 2020-09-23 – 2020-09-26 (×4): 200 mg via ORAL
  Filled 2020-09-22 (×4): qty 2

## 2020-09-22 MED ORDER — SODIUM CHLORIDE 0.45 % IV SOLN: INTRAVENOUS | Status: DC | PRN

## 2020-09-22 MED ORDER — ACETAMINOPHEN 650 MG RE SUPP
650.0000 mg | Freq: Once | RECTAL | Status: AC
  Administered 2020-09-22: 650 mg via RECTAL

## 2020-09-22 MED ORDER — POTASSIUM CHLORIDE 10 MEQ/50ML IV SOLN: 10.0000 meq | INTRAVENOUS | Status: AC

## 2020-09-22 MED ORDER — ORAL CARE MOUTH RINSE
15.0000 mL | OROMUCOSAL | Status: DC
  Administered 2020-09-22 (×2): 15 mL via OROMUCOSAL

## 2020-09-22 MED ORDER — ASPIRIN EC 325 MG PO TBEC
325.0000 mg | DELAYED_RELEASE_TABLET | Freq: Every day | ORAL | Status: DC
  Administered 2020-09-23 – 2020-09-26 (×4): 325 mg via ORAL
  Filled 2020-09-22 (×4): qty 1

## 2020-09-22 MED ORDER — DEXMEDETOMIDINE HCL 200 MCG/2ML IV SOLN
INTRAVENOUS | Status: DC | PRN
  Administered 2020-09-22: 8 ug via INTRAVENOUS
  Administered 2020-09-22: 12 ug via INTRAVENOUS

## 2020-09-22 MED ORDER — ACETAMINOPHEN 160 MG/5ML PO SOLN: 1000.0000 mg | Freq: Four times a day (QID) | ORAL | Status: DC

## 2020-09-22 MED ORDER — DOBUTAMINE IN D5W 4-5 MG/ML-% IV SOLN
2.5000 ug/kg/min | INTRAVENOUS | Status: DC
  Filled 2020-09-22: qty 250

## 2020-09-22 MED ORDER — METOPROLOL TARTRATE 5 MG/5ML IV SOLN
2.5000 mg | INTRAVENOUS | Status: DC | PRN
  Administered 2020-09-23 – 2020-09-24 (×5): 5 mg via INTRAVENOUS
  Filled 2020-09-22 (×5): qty 5

## 2020-09-22 MED ORDER — PROTAMINE SULFATE 10 MG/ML IV SOLN
INTRAVENOUS | Status: DC | PRN
  Administered 2020-09-22: 80 mg via INTRAVENOUS
  Administered 2020-09-22: 10 mg via INTRAVENOUS
  Administered 2020-09-22 (×3): 80 mg via INTRAVENOUS
  Administered 2020-09-22: 50 mg via INTRAVENOUS

## 2020-09-22 MED ORDER — NICARDIPINE HCL IN NACL 20-0.86 MG/200ML-% IV SOLN
3.0000 mg/h | INTRAVENOUS | Status: DC
  Filled 2020-09-22: qty 200

## 2020-09-22 MED ORDER — METOPROLOL TARTRATE 25 MG/10 ML ORAL SUSPENSION: 12.5000 mg | Freq: Two times a day (BID) | ORAL | Status: DC

## 2020-09-22 MED ORDER — PROPOFOL 10 MG/ML IV BOLUS
INTRAVENOUS | Status: DC | PRN
  Administered 2020-09-22 (×2): 50 mg via INTRAVENOUS
  Administered 2020-09-22: 30 mg via INTRAVENOUS

## 2020-09-22 MED ORDER — BUPIVACAINE LIPOSOME 1.3 % IJ SUSP
20.0000 mL | INTRAMUSCULAR | Status: DC
  Filled 2020-09-22: qty 20

## 2020-09-22 MED ORDER — METOPROLOL TARTRATE 12.5 MG HALF TABLET
12.5000 mg | ORAL_TABLET | Freq: Once | ORAL | Status: DC
  Filled 2020-09-22: qty 1

## 2020-09-22 MED ORDER — LACTATED RINGERS IV SOLN: INTRAVENOUS | Status: DC

## 2020-09-22 MED ORDER — FENTANYL CITRATE (PF) 250 MCG/5ML IJ SOLN
INTRAMUSCULAR | Status: AC
  Filled 2020-09-22: qty 20

## 2020-09-22 MED ORDER — LACTATED RINGERS IV SOLN: INTRAVENOUS | Status: DC | PRN

## 2020-09-22 MED ORDER — HEMOSTATIC AGENTS (NO CHARGE) OPTIME
TOPICAL | Status: DC | PRN
  Administered 2020-09-22 (×2): 1 via TOPICAL

## 2020-09-22 MED ORDER — METOPROLOL TARTRATE 12.5 MG HALF TABLET
12.5000 mg | ORAL_TABLET | Freq: Two times a day (BID) | ORAL | Status: DC
  Administered 2020-09-23 – 2020-09-26 (×7): 12.5 mg via ORAL
  Filled 2020-09-22 (×8): qty 1

## 2020-09-22 MED ORDER — VANCOMYCIN HCL IN DEXTROSE 1-5 GM/200ML-% IV SOLN
1000.0000 mg | Freq: Once | INTRAVENOUS | Status: AC
  Administered 2020-09-22: 1000 mg via INTRAVENOUS
  Filled 2020-09-22: qty 200

## 2020-09-22 MED ORDER — PROTAMINE SULFATE 10 MG/ML IV SOLN
INTRAVENOUS | Status: AC
  Filled 2020-09-22: qty 25

## 2020-09-22 MED ORDER — CHLORHEXIDINE GLUCONATE 0.12 % MT SOLN
15.0000 mL | OROMUCOSAL | Status: AC
  Administered 2020-09-22: 15 mL via OROMUCOSAL

## 2020-09-22 MED ORDER — SODIUM CHLORIDE 0.9% FLUSH: 3.0000 mL | INTRAVENOUS | Status: DC | PRN

## 2020-09-22 MED ORDER — VANCOMYCIN HCL 1000 MG IV SOLR
INTRAVENOUS | Status: DC | PRN
  Administered 2020-09-22: 3 g via TOPICAL

## 2020-09-22 MED ORDER — BUPIVACAINE HCL (PF) 0.5 % IJ SOLN
INTRAMUSCULAR | Status: AC
  Filled 2020-09-22: qty 30

## 2020-09-22 MED ORDER — MIDAZOLAM HCL (PF) 5 MG/ML IJ SOLN
INTRAMUSCULAR | Status: DC | PRN
  Administered 2020-09-22: 1 mg via INTRAVENOUS
  Administered 2020-09-22: 4 mg via INTRAVENOUS
  Administered 2020-09-22: 5 mg via INTRAVENOUS

## 2020-09-22 MED ORDER — SODIUM CHLORIDE 0.9% FLUSH
10.0000 mL | Freq: Two times a day (BID) | INTRAVENOUS | Status: DC
  Administered 2020-09-22 – 2020-09-23 (×3): 10 mL

## 2020-09-22 MED ORDER — NOREPINEPHRINE 4 MG/250ML-% IV SOLN
0.0000 ug/min | INTRAVENOUS | Status: DC
  Administered 2020-09-22: 2 ug/min via INTRAVENOUS

## 2020-09-22 MED ORDER — PHENYLEPHRINE HCL-NACL 20-0.9 MG/250ML-% IV SOLN: 0.0000 ug/min | INTRAVENOUS | Status: DC

## 2020-09-22 MED ORDER — HEPARIN SODIUM (PORCINE) 1000 UNIT/ML IJ SOLN
INTRAMUSCULAR | Status: AC
  Filled 2020-09-22: qty 1

## 2020-09-22 MED ORDER — BISACODYL 5 MG PO TBEC
10.0000 mg | DELAYED_RELEASE_TABLET | Freq: Every day | ORAL | Status: DC
  Administered 2020-09-23 – 2020-09-26 (×4): 10 mg via ORAL
  Filled 2020-09-22 (×4): qty 2

## 2020-09-22 MED ORDER — CHLORHEXIDINE GLUCONATE 0.12% ORAL RINSE (MEDLINE KIT): 15.0000 mL | Freq: Two times a day (BID) | OROMUCOSAL | Status: DC

## 2020-09-22 MED ORDER — NITROGLYCERIN IN D5W 200-5 MCG/ML-% IV SOLN: 0.0000 ug/min | INTRAVENOUS | Status: DC

## 2020-09-22 MED ORDER — ACETAMINOPHEN 500 MG PO TABS
1000.0000 mg | ORAL_TABLET | Freq: Four times a day (QID) | ORAL | Status: DC
  Administered 2020-09-23 – 2020-09-26 (×14): 1000 mg via ORAL
  Filled 2020-09-22 (×15): qty 2

## 2020-09-22 MED ORDER — DEXTROSE 50 % IV SOLN: 0.0000 mL | INTRAVENOUS | Status: DC | PRN

## 2020-09-22 MED ORDER — OXYCODONE HCL 5 MG PO TABS
5.0000 mg | ORAL_TABLET | ORAL | Status: DC | PRN
  Administered 2020-09-22: 10 mg via ORAL
  Administered 2020-09-23: 5 mg via ORAL
  Administered 2020-09-23 – 2020-09-24 (×4): 10 mg via ORAL
  Filled 2020-09-22 (×3): qty 2
  Filled 2020-09-22: qty 1
  Filled 2020-09-22: qty 2
  Filled 2020-09-22: qty 1
  Filled 2020-09-22: qty 2

## 2020-09-22 MED ORDER — SODIUM CHLORIDE 0.9 % IV SOLN: 250.0000 mL | INTRAVENOUS | Status: DC

## 2020-09-22 MED ORDER — MILRINONE LACTATE IN DEXTROSE 20-5 MG/100ML-% IV SOLN
0.1250 ug/kg/min | INTRAVENOUS | Status: DC
  Administered 2020-09-22: 0.25 ug/kg/min via INTRAVENOUS
  Filled 2020-09-22 (×2): qty 100

## 2020-09-22 MED ORDER — LACTATED RINGERS IV SOLN: 500.0000 mL | Freq: Once | INTRAVENOUS | Status: DC | PRN

## 2020-09-22 MED ORDER — VANCOMYCIN HCL 1000 MG IV SOLR
INTRAVENOUS | Status: AC
  Filled 2020-09-22: qty 3000

## 2020-09-22 MED ORDER — METOCLOPRAMIDE HCL 5 MG/ML IJ SOLN
10.0000 mg | Freq: Four times a day (QID) | INTRAMUSCULAR | Status: AC
  Administered 2020-09-22 – 2020-09-23 (×4): 10 mg via INTRAVENOUS
  Filled 2020-09-22 (×4): qty 2

## 2020-09-22 MED ORDER — ROSUVASTATIN CALCIUM 5 MG PO TABS
5.0000 mg | ORAL_TABLET | Freq: Every day | ORAL | Status: DC
Start: 2020-09-23 — End: 2020-09-26
  Administered 2020-09-23 – 2020-09-26 (×4): 5 mg via ORAL
  Filled 2020-09-22 (×4): qty 1

## 2020-09-22 MED ORDER — DEXMEDETOMIDINE HCL IN NACL 400 MCG/100ML IV SOLN
0.1000 ug/kg/h | INTRAVENOUS | Status: DC
  Filled 2020-09-22: qty 100

## 2020-09-22 MED ORDER — CHLORHEXIDINE GLUCONATE 4 % EX LIQD: 30.0000 mL | CUTANEOUS | Status: DC

## 2020-09-22 MED ORDER — SODIUM CHLORIDE 0.9% FLUSH: 10.0000 mL | INTRAVENOUS | Status: DC | PRN

## 2020-09-22 MED ORDER — FAMOTIDINE IN NACL 20-0.9 MG/50ML-% IV SOLN
20.0000 mg | Freq: Two times a day (BID) | INTRAVENOUS | Status: DC
  Administered 2020-09-22: 20 mg via INTRAVENOUS
  Filled 2020-09-22 (×2): qty 50

## 2020-09-22 MED ORDER — DEXMEDETOMIDINE HCL IN NACL 400 MCG/100ML IV SOLN
0.0000 ug/kg/h | INTRAVENOUS | Status: DC
  Administered 2020-09-22: 0.5 ug/kg/h via INTRAVENOUS

## 2020-09-22 MED ORDER — MIDAZOLAM HCL (PF) 10 MG/2ML IJ SOLN
INTRAMUSCULAR | Status: AC
  Filled 2020-09-22: qty 2

## 2020-09-22 MED ORDER — ACETAMINOPHEN 160 MG/5ML PO SOLN: 650.0000 mg | Freq: Once | ORAL | Status: AC

## 2020-09-22 MED ORDER — ASPIRIN 81 MG PO CHEW: 324.0000 mg | CHEWABLE_TABLET | Freq: Every day | ORAL | Status: DC

## 2020-09-22 MED ORDER — MIDAZOLAM HCL 2 MG/2ML IJ SOLN: 2.0000 mg | INTRAMUSCULAR | Status: DC | PRN

## 2020-09-22 MED ORDER — PHENYLEPHRINE 40 MCG/ML (10ML) SYRINGE FOR IV PUSH (FOR BLOOD PRESSURE SUPPORT)
PREFILLED_SYRINGE | INTRAVENOUS | Status: DC | PRN
  Administered 2020-09-22: 40 ug via INTRAVENOUS

## 2020-09-22 MED ORDER — PLASMA-LYTE 148 IV SOLN
INTRAVENOUS | Status: DC | PRN
  Administered 2020-09-22: 500 mL

## 2020-09-22 MED ORDER — CHLORHEXIDINE GLUCONATE 0.12 % MT SOLN
15.0000 mL | Freq: Once | OROMUCOSAL | Status: DC
  Filled 2020-09-22: qty 15

## 2020-09-22 MED ORDER — PROTAMINE SULFATE 10 MG/ML IV SOLN
INTRAVENOUS | Status: AC
  Filled 2020-09-22: qty 15

## 2020-09-22 MED ORDER — MORPHINE SULFATE (PF) 2 MG/ML IV SOLN
1.0000 mg | INTRAVENOUS | Status: DC | PRN
  Administered 2020-09-22 (×4): 1 mg via INTRAVENOUS
  Filled 2020-09-22 (×2): qty 1

## 2020-09-22 MED ORDER — CHLORHEXIDINE GLUCONATE CLOTH 2 % EX PADS
6.0000 | MEDICATED_PAD | Freq: Every day | CUTANEOUS | Status: DC
  Administered 2020-09-23 – 2020-09-24 (×2): 6 via TOPICAL

## 2020-09-22 SURGICAL SUPPLY — 88 items
ADAPTER CARDIO PERF ANTE/RETRO (ADAPTER) ×4 IMPLANT
ADH SKN CLS APL DERMABOND .7 (GAUZE/BANDAGES/DRESSINGS) ×3
ADPR PRFSN 84XANTGRD RTRGD (ADAPTER) ×3
APL SRG 7X2 LUM MLBL SLNT (VASCULAR PRODUCTS)
APPLICATOR TIP COSEAL (VASCULAR PRODUCTS) IMPLANT
BAG DECANTER FOR FLEXI CONT (MISCELLANEOUS) ×4 IMPLANT
BLADE CLIPPER SURG (BLADE) ×4 IMPLANT
BLADE STERNUM SYSTEM 6 (BLADE) ×4 IMPLANT
BLADE SURG 11 STRL SS (BLADE) ×1 IMPLANT
BNDG ELASTIC 4X5.8 VLCR STR LF (GAUZE/BANDAGES/DRESSINGS) ×4 IMPLANT
BNDG ELASTIC 6X5.8 VLCR STR LF (GAUZE/BANDAGES/DRESSINGS) ×4 IMPLANT
BNDG GAUZE ELAST 4 BULKY (GAUZE/BANDAGES/DRESSINGS) ×4 IMPLANT
CANISTER SUCT 3000ML PPV (MISCELLANEOUS) ×4 IMPLANT
CANNULA NON VENT 22FR 12 (CANNULA) ×1 IMPLANT
CATH CPB KIT HENDRICKSON (MISCELLANEOUS) ×4 IMPLANT
CATH ROBINSON RED A/P 18FR (CATHETERS) ×8 IMPLANT
CLIP RETRACTION 3.0MM CORONARY (MISCELLANEOUS) ×3 IMPLANT
CLIP VESOCCLUDE SM WIDE 24/CT (CLIP) ×12 IMPLANT
DEFOGGER ANTIFOG KIT (MISCELLANEOUS) ×1 IMPLANT
DERMABOND ADVANCED (GAUZE/BANDAGES/DRESSINGS) ×1
DERMABOND ADVANCED .7 DNX12 (GAUZE/BANDAGES/DRESSINGS) ×3 IMPLANT
DRAIN CHANNEL 28F RND 3/8 FF (WOUND CARE) ×12 IMPLANT
DRAPE CARDIOVASCULAR INCISE (DRAPES) ×4
DRAPE SLUSH/WARMER DISC (DRAPES) ×4 IMPLANT
DRAPE SRG 135X102X78XABS (DRAPES) ×3 IMPLANT
DRSG AQUACEL AG ADV 3.5X14 (GAUZE/BANDAGES/DRESSINGS) ×4 IMPLANT
ELECT CAUTERY BLADE 6.4 (BLADE) ×4 IMPLANT
ELECT REM PT RETURN 9FT ADLT (ELECTROSURGICAL) ×8
ELECTRODE REM PT RTRN 9FT ADLT (ELECTROSURGICAL) ×6 IMPLANT
FELT TEFLON 1X6 (MISCELLANEOUS) ×7 IMPLANT
GAUZE SPONGE 4X4 12PLY STRL (GAUZE/BANDAGES/DRESSINGS) ×8 IMPLANT
GAUZE SPONGE 4X4 12PLY STRL LF (GAUZE/BANDAGES/DRESSINGS) ×2 IMPLANT
GLOVE ECLIPSE 6.0 STRL STRAW (GLOVE) ×1 IMPLANT
GOWN STRL REUS W/ TWL LRG LVL3 (GOWN DISPOSABLE) ×12 IMPLANT
GOWN STRL REUS W/TWL LRG LVL3 (GOWN DISPOSABLE) ×16
INSERT FOGARTY XLG (MISCELLANEOUS) ×4 IMPLANT
INSERT SUTURE HOLDER (MISCELLANEOUS) ×4 IMPLANT
KIT BASIN OR (CUSTOM PROCEDURE TRAY) ×4 IMPLANT
KIT SUCTION CATH 14FR (SUCTIONS) ×4 IMPLANT
KIT TURNOVER KIT B (KITS) ×4 IMPLANT
KIT VASOVIEW HEMOPRO 2 VH 4000 (KITS) ×4 IMPLANT
LINE EXTENSION DELIVERY (MISCELLANEOUS) ×1 IMPLANT
MARKER SKIN DUAL TIP RULER LAB (MISCELLANEOUS) ×1 IMPLANT
NDL 18GX1X1/2 (RX/OR ONLY) (NEEDLE) ×3 IMPLANT
NEEDLE 18GX1X1/2 (RX/OR ONLY) (NEEDLE) ×4 IMPLANT
NS IRRIG 1000ML POUR BTL (IV SOLUTION) ×20 IMPLANT
PACK E OPEN HEART (SUTURE) ×4 IMPLANT
PACK OPEN HEART (CUSTOM PROCEDURE TRAY) ×4 IMPLANT
PACK SPY-PHI (KITS) ×1 IMPLANT
PAD ARMBOARD 7.5X6 YLW CONV (MISCELLANEOUS) ×8 IMPLANT
PAD ELECT DEFIB RADIOL ZOLL (MISCELLANEOUS) ×4 IMPLANT
PENCIL BUTTON HOLSTER BLD 10FT (ELECTRODE) ×4 IMPLANT
POSITIONER HEAD DONUT 9IN (MISCELLANEOUS) ×4 IMPLANT
POWDER SURGICEL 3.0 GRAM (HEMOSTASIS) ×4 IMPLANT
PUNCH AORTIC ROTATE 4.5MM 8IN (MISCELLANEOUS) ×1 IMPLANT
SEALANT SURG COSEAL 8ML (VASCULAR PRODUCTS) ×1 IMPLANT
SET CARDIOPLEGIA MPS 5001102 (MISCELLANEOUS) ×1 IMPLANT
SUT BONE WAX W31G (SUTURE) ×4 IMPLANT
SUT MNCRL AB 3-0 PS2 18 (SUTURE) ×8 IMPLANT
SUT MNCRL AB 4-0 PS2 18 (SUTURE) ×1 IMPLANT
SUT PDS AB 1 CTX 36 (SUTURE) ×8 IMPLANT
SUT PROLENE 3 0 SH DA (SUTURE) ×5 IMPLANT
SUT PROLENE 4 0 SH DA (SUTURE) ×3 IMPLANT
SUT PROLENE 5 0 C 1 36 (SUTURE) IMPLANT
SUT PROLENE 6 0 C 1 30 (SUTURE) ×12 IMPLANT
SUT PROLENE 7 0 BV 1 (SUTURE) ×1 IMPLANT
SUT PROLENE 8 0 BV175 6 (SUTURE) IMPLANT
SUT PROLENE BLUE 7 0 (SUTURE) ×4 IMPLANT
SUT SILK 2 0 SH CR/8 (SUTURE) IMPLANT
SUT SILK 3 0 SH CR/8 (SUTURE) IMPLANT
SUT STEEL 6MS V (SUTURE) ×4 IMPLANT
SUT STEEL SZ 6 DBL 3X14 BALL (SUTURE) ×4 IMPLANT
SUT VIC AB 1 CTX 36 (SUTURE) ×4
SUT VIC AB 1 CTX36XBRD ANBCTR (SUTURE) IMPLANT
SUT VIC AB 2-0 CT1 27 (SUTURE) ×4
SUT VIC AB 2-0 CT1 TAPERPNT 27 (SUTURE) IMPLANT
SUT VIC AB 2-0 CTX 27 (SUTURE) IMPLANT
SUT VIC AB 3-0 X1 27 (SUTURE) IMPLANT
SYR 10ML LL (SYRINGE) IMPLANT
SYSTEM SAHARA CHEST DRAIN ATS (WOUND CARE) ×4 IMPLANT
TAPE CLOTH SURG 4X10 WHT LF (GAUZE/BANDAGES/DRESSINGS) ×1 IMPLANT
TAPE PAPER 2X10 WHT MICROPORE (GAUZE/BANDAGES/DRESSINGS) ×1 IMPLANT
TOWEL GREEN STERILE (TOWEL DISPOSABLE) ×4 IMPLANT
TOWEL GREEN STERILE FF (TOWEL DISPOSABLE) ×4 IMPLANT
TRAY FOLEY SLVR 16FR TEMP STAT (SET/KITS/TRAYS/PACK) ×4 IMPLANT
TUBING LAP HI FLOW INSUFFLATIO (TUBING) ×4 IMPLANT
UNDERPAD 30X36 HEAVY ABSORB (UNDERPADS AND DIAPERS) ×4 IMPLANT
WATER STERILE IRR 1000ML POUR (IV SOLUTION) ×8 IMPLANT

## 2020-09-22 NOTE — Anesthesia Procedure Notes (Signed)
Central Venous Catheter Insertion Performed by: Nolon Nations, MD, anesthesiologist Start/End2/03/2021 7:00 AM, 09/22/2020 7:15 AM Patient location: Pre-op. Preanesthetic checklist: patient identified, IV checked, site marked, risks and benefits discussed, surgical consent, monitors and equipment checked, pre-op evaluation, timeout performed and anesthesia consent Position: Trendelenburg Lidocaine 1% used for infiltration and patient sedated Hand hygiene performed  and maximum sterile barriers used  Catheter size: 8.5 Fr Sheath introducer Procedure performed using ultrasound guided technique. Ultrasound Notes:anatomy identified, needle tip was noted to be adjacent to the nerve/plexus identified, no ultrasound evidence of intravascular and/or intraneural injection and image(s) printed for medical record Attempts: 1 Following insertion, line sutured, dressing applied and Biopatch. Post procedure assessment: blood return through all ports, free fluid flow and no air  Patient tolerated the procedure well with no immediate complications.

## 2020-09-22 NOTE — Anesthesia Postprocedure Evaluation (Signed)
Anesthesia Post Note  Patient: Samanyu L Defina  Procedure(s) Performed: CORONARY ARTERY BYPASS GRAFTING (CABG), ON PUMP, TIMES THREE, USING LEFT INTERNAL MAMMARY ARTERY AND ENDOSCOPICALLY HARVESTED RIGHT GREATER SAPHENOUS VEIN (N/A Chest) TRANSESOPHAGEAL ECHOCARDIOGRAM (TEE) (N/A ) INDOCYANINE GREEN FLUORESCENCE IMAGING (ICG) (N/A ) ENDOVEIN HARVEST OF GREATER SAPHENOUS VEIN (Right Leg Upper)     Patient location during evaluation: SICU Anesthesia Type: General Level of consciousness: sedated and patient remains intubated per anesthesia plan Pain management: pain level controlled Vital Signs Assessment: post-procedure vital signs reviewed and stable Respiratory status: patient remains intubated per anesthesia plan and patient on ventilator - see flowsheet for VS Cardiovascular status: stable Anesthetic complications: no   No complications documented.  Last Vitals:  Vitals:   09/22/20 1315 09/22/20 1330  BP:    Pulse: 80 80  Resp: 12 12  Temp: (!) 35.5 C (!) 35.7 C  SpO2: 100% 100%    Last Pain:  Vitals:   09/22/20 1300  TempSrc: Core  PainSc:                  Nolon Nations

## 2020-09-22 NOTE — Progress Notes (Signed)
RT asked if pt wanted to wear cpap tonight pt stated he wanted to stay on his 4L [place] for tonight.

## 2020-09-22 NOTE — Transfer of Care (Signed)
Immediate Anesthesia Transfer of Care Note  Patient: Juan Hatfield  Procedure(s) Performed: CORONARY ARTERY BYPASS GRAFTING (CABG), ON PUMP, TIMES THREE, USING LEFT INTERNAL MAMMARY ARTERY AND ENDOSCOPICALLY HARVESTED RIGHT GREATER SAPHENOUS VEIN (N/A Chest) TRANSESOPHAGEAL ECHOCARDIOGRAM (TEE) (N/A ) INDOCYANINE GREEN FLUORESCENCE IMAGING (ICG) (N/A ) ENDOVEIN HARVEST OF GREATER SAPHENOUS VEIN (Right Leg Upper)  Patient Location: ICU  Anesthesia Type:General  Level of Consciousness: sedated and unresponsive  Airway & Oxygen Therapy: Patient remains intubated per anesthesia plan and Patient placed on Ventilator (see vital sign flow sheet for setting)  Post-op Assessment: Report given to RN and Post -op Vital signs reviewed and stable  Post vital signs: Reviewed and stable  Last Vitals:  Vitals Value Taken Time  BP    Temp    Pulse    Resp    SpO2      Last Pain:  Vitals:   09/22/20 0635  TempSrc:   PainSc: 0-No pain      Patients Stated Pain Goal: 2 (83/43/73 5789)  Complications: No complications documented.

## 2020-09-22 NOTE — Anesthesia Procedure Notes (Signed)
Central Venous Catheter Insertion Performed by: Nolon Nations, MD, anesthesiologist Start/End2/03/2021 7:15 AM, 09/22/2020 7:20 AM Patient location: Pre-op. Preanesthetic checklist: patient identified, IV checked, site marked, risks and benefits discussed, surgical consent, monitors and equipment checked, pre-op evaluation, timeout performed and anesthesia consent Hand hygiene performed  and maximum sterile barriers used  PA cath was placed.Swan type:thermodilution PA Cath depth:48 Procedure performed without using ultrasound guided technique. Attempts: 1 Patient tolerated the procedure well with no immediate complications.

## 2020-09-22 NOTE — Progress Notes (Signed)
Contact MD for patient failing SBT x2.  He did pass his breathing mechanics, but remains tachypneic. ABG below.   Results for Juan Hatfield, Juan Hatfield (MRN [id number]) as of 09/22/2020 17:25  Ref. Range 09/22/2020 17:17  Sample type Unknown ARTERIAL  pH, Arterial Latest Ref Range: 7.350 - 7.450  7.288 (L)  pCO2 arterial Latest Ref Range: 32.0 - 48.0 mmHg 49.9 (H)  pO2, Arterial Latest Ref Range: 83.0 - 108.0 mmHg 113 (H)  TCO2 Latest Ref Range: 22 - 32 mmol/L 25  Acid-base deficit Latest Ref Range: 0.0 - 2.0 mmol/L 3.0 (H)  Bicarbonate Latest Ref Range: 20.0 - 28.0 mmol/L 23.7  O2 Saturation Latest Units: % 98.0  Patient temperature Unknown 37.6 C  Sodium Latest Ref Range: 135 - 145 mmol/L 138  Potassium Latest Ref Range: 3.5 - 5.1 mmol/L 4.4  Calcium Ionized Latest Ref Range: 1.15 - 1.40 mmol/L 1.16  Hemoglobin Latest Ref Range: 13.0 - 17.0 g/dL 14.6  HCT Latest Ref Range: 39.0 - 52.0 % 43.0   Orders or adjust tidal volume to 600 and restart wean process again. RN communicated with RT and ventilator has been adjusted per order.

## 2020-09-22 NOTE — Hospital Course (Addendum)
History of Present Illness:  Mr. Juan Hatfield is a 75 yo AA male who is Jehovah's witness.  He has known history of DM, HTN and renal insufficiency.  He developed complaints of indigestion after performing yard work this past fall.  He presented to Dr. Radford Pax for evaluation.  She recommended obtaining a stress test which was positive.  Therefore, he underwent cardiac catheterization on 07/20/2020 and this showed multivessel CAD.  It was felt the patient would require coronary bypass grafting procedure and he was referred to [facility] for consultation.  He was initially evaluated by Dr. Kipp Brood at which time it was felt surgical revascularization would be indicated.  His symptoms had remained stable and the patient wished to wait until after the first of the year to proceed with surgery.  The patient was scheduled for surgery, which ultimately ended up having to be rescheduled due an emergency surgery done the evening prior to his surgery.  He was followed up by Dr. Orvan Seen who was agreeable to perform patients surgery.  He spoke with patient in regards to wishes to transfusion protocols.  The risks and benefits of the procedure were explained to the patient and he was agreeable to proceed.  Hospital course:  Mr. Galentine presented to [facility] on 09/22/2020.  He was taken to the operating room and underwent CABG x 3 utilizing LIMA to LAD, SVG to Diagonal, and SVG to OM.  He also underwent endoscopic harvest of greater saphenous vein from his right leg.  He tolerated the procedure without difficulty and was taken to the SICU in stable condition.  He was extubated the evening of surgery.  During his stay in the SICU the patient was weaned off levophed and Milrinone drips as tolerated.  His chest tubes and arterial lines were removed without difficulty.  His post operative hemoglobin was 13.1.  He was mildly hypertensive and was started on isordil.  He was diuresed for mild volume  overload state.

## 2020-09-22 NOTE — Procedures (Signed)
Extubation Procedure Note  Patient Details:   Name: Juan Hatfield DOB: 08/08/1946 MRN: [id number]   Airway Documentation:    Vent end date: 09/22/20 Vent end time: 2039   Evaluation  O2 sats: stable throughout Complications: No apparent complications Patient did tolerate procedure well. Bilateral Breath Sounds: Clear,Diminished   Yes  Graciella Freer 09/22/2020, 8:40 PM   Pt extubated per Dr.Gerhardt. Pt performed NIF -22 and VC of 1L. Pt had positive cuff leak and was placed on 4L [place] at this time.

## 2020-09-22 NOTE — Progress Notes (Signed)
RT NOTE: Post weaing ABG outside parameters. PT placed back on original settings. RT will retry rapid weaning protocol when patient more alert. VSS. RT will continue to monitor.

## 2020-09-22 NOTE — Op Note (Signed)
CARDIOTHORACIC SURGERY OPERATIVE NOTE  Date of Procedure: 09/22/2020  Preoperative Diagnosis: Severe 3-vessel Coronary Artery Disease  Postoperative Diagnosis: Same  Procedure:    Coronary Artery Bypass Grafting x 3  Left Internal Mammary Artery to Distal Left Anterior Descending Coronary Artery; Saphenous Vein Graft to Obtuse Marginal Branch of Left Circumflex Coronary Artery; Sapheonous Vein Graft to Diagonal Branch Coronary Artery; Endoscopic Vein Harvest from right thigh and Lower Leg Completion graft surveillance with indocyanine green fluorescence angiography Surgeon: B.  Murvin Natal, MD  Assistant: Leretha Pol, PA-C  Anesthesia: General  Operative Findings:  Preserved left ventricular systolic function  Good quality left internal mammary artery conduit  Good quality saphenous vein conduit  Good quality target vessels for grafting    BRIEF CLINICAL NOTE AND INDICATIONS FOR SURGERY  75 year old man with diabetes and mild renal insufficiency presented with worsening chest pain on exertion.  He underwent left heart catheterization back in December 2021.  This demonstrated multivessel coronary artery disease.  He was referred for CABG but chose to delay this until last month.  Unfortunately, he was canceled on a couple of occasions due to scheduling conflicts.  He now presents for CABG.  Of note, the patient has religious objection to blood transfusions.   DETAILS OF THE OPERATIVE PROCEDURE  Preparation:  The patient is brought to the operating room on the above mentioned date and central monitoring was established by the anesthesia team including placement of Swan-Ganz catheter and radial arterial line. The patient is placed in the supine position on the operating table.  Intravenous antibiotics are administered. General endotracheal anesthesia is induced uneventfully. A Foley catheter is placed.  Baseline transesophageal echocardiogram was performed.  Findings were  notable for preserved LV function and no significant valvular disease  The patient's chest, abdomen, both groins, and both lower extremities are prepared and draped in a sterile manner. A time out procedure is performed.   Surgical Approach and Conduit Harvest:  A median sternotomy incision was performed and the left internal mammary artery is dissected from the chest wall and prepared for bypass grafting. The left internal mammary artery is notably good quality conduit. Simultaneously, the greater saphenous vein is obtained from the patient's right thigh using endoscopic vein harvest technique. The saphenous vein is notably good quality conduit. After removal of the saphenous vein, the small surgical incisions in the lower extremity are closed with absorbable suture. Following systemic heparinization, the left internal mammary artery was transected distally noted to have excellent flow.   Extracorporeal Cardiopulmonary Bypass and Myocardial Protection:  The pericardium is opened. The ascending aorta is nondiseased in appearance. The ascending aorta and the right atrium are cannulated for cardiopulmonary bypass.  Adequate heparinization is verified.    The entire pre-bypass portion of the operation was notable for stable hemodynamics.  Cardiopulmonary bypass was begun and the surface of the heart is inspected. Distal target vessels are selected for coronary artery bypass grafting. A cardioplegia cannula is placed in the ascending aorta.   The patient is allowed to cool passively to 35C systemic temperature.  The aortic cross clamp is applied and cold blood cardioplegia is delivered initially in an antegrade fashion through the aortic root.   Iced saline slush is applied for topical hypothermia.  The initial cardioplegic arrest is rapid with early diastolic arrest.  Repeat doses of cardioplegia are administered intermittently throughout the entire cross clamp portion of the operation through the  aortic root and through subsequently placed vein grafts in order to  maintain completely flat electrocardiogram.  Coronary Artery Bypass Grafting:    The  obtuse marginal branch of the left circumflex coronary artery was grafted using a reversed saphenous vein graft in an end-to-side fashion.  At the site of distal anastomosis the target vessel was good quality and measured approximately 2 mm in diameter.  The first diagonal branch of the left anterior descending coronary artery was grafted using a reversed saphenous vein graft in an end-to-side fashion.  At the site of distal anastomosis the target vessel was good quality and measured approximately 2 mm in diameter.     The distal left anterior coronary artery was grafted with the left internal mammary artery in an end-to-side fashion.  At the site of distal anastomosis the target vessel was good quality and measured approximately 2 mm in diameter. Anastomotic patency and runoff was confirmed with indocyanine green fluorescence imaging (SPY).  All proximal vein graft anastomoses were placed directly to the ascending aorta prior to removal of the aortic cross clamp.  A hotshot dose of cardioplegia was given down the aortic root.  De-airing procedures were performed and the aortic cross-clamp was removed.   Procedure Completion:  All proximal and distal coronary anastomoses were inspected for hemostasis and appropriate graft orientation. Epicardial pacing wires are fixed to the right ventricular outflow tract and to the right atrial appendage. The patient is rewarmed to 37C temperature. The patient is weaned and disconnected from cardiopulmonary bypass.  The patient's rhythm at separation from bypass was sinus bradycardia.  The patient was weaned from cardiopulmonary bypass  without any inotropic support.  Followup transesophageal echocardiogram performed after separation from bypass revealed no changes from the preoperative exam.  The aortic  and venous cannula were removed uneventfully. Protamine was administered to reverse the anticoagulation. The mediastinum and pleural space were inspected for hemostasis and irrigated with saline solution. The mediastinum and left pleural space were drained using fluted chest tubes placed through separate stab incisions inferiorly.  The soft tissues anterior to the aorta were reapproximated loosely. The sternum is closed with double strength sternal wire. The soft tissues anterior to the sternum were closed in multiple layers and the skin is closed with a running subcuticular skin closure.  The post-bypass portion of the operation was notable for stable rhythm and hemodynamics.  No blood products were administered during the operation.   Disposition:  The patient tolerated the procedure well and is transported to the surgical intensive care in stable condition. There are no intraoperative complications. All sponge instrument and needle counts are verified correct at completion of the operation.    Jayme Cloud, MD 09/22/2020 3:09 PM

## 2020-09-22 NOTE — Progress Notes (Signed)
  Echocardiogram Echocardiogram Transesophageal has been performed.  Fidel Levy 09/22/2020, 9:02 AM

## 2020-09-22 NOTE — Anesthesia Procedure Notes (Signed)
Arterial Line Insertion Start/End2/03/2021 7:00 AM, 09/22/2020 7:05 AM Performed by: CRNA  Preanesthetic checklist: patient identified, IV checked, site marked, risks and benefits discussed, surgical consent, monitors and equipment checked, pre-op evaluation, timeout performed and anesthesia consent Lidocaine 1% used for infiltration Left, radial was placed Catheter size: 20 G Hand hygiene performed  and maximum sterile barriers used   Attempts: 1 Procedure performed without using ultrasound guided technique. Following insertion, Biopatch and dressing applied. Post procedure assessment: normal  Patient tolerated the procedure well with no immediate complications.

## 2020-09-22 NOTE — Brief Op Note (Signed)
09/22/2020  10:26 AM  PATIENT:  Juan Hatfield  75 y.o. male  PRE-OPERATIVE DIAGNOSIS:  CORONARY ARTERY DISEASE  POST-OPERATIVE DIAGNOSIS:  CORONARY ARTERY DISEASE  PROCEDURE:  Procedure(s):  CORONARY ARTERY BYPASS GRAFTING x 3 -LIMA to LAD -SVG to DIAGONAL -SVG to OM  ENDOSCOPIC HARVEST GREATER SAPHENOUS VEIN  -Right Leg (35/15)  TRANSESOPHAGEAL ECHOCARDIOGRAM (TEE) (N/A)  INDOCYANINE GREEN FLUORESCENCE IMAGING (ICG) (N/A)   SURGEON:  Surgeon(s) and Role:    * Wonda Olds, MD - Primary  PHYSICIAN ASSISTANT: Ellwood Handler PA-C  ANESTHESIA:   general  EBL: per anes record  BLOOD ADMINISTERED: CELLSAVER  DRAINS: Left Pleural Chest Tube, Mediastinal Chest Drains   LOCAL MEDICATIONS USED:  NONE  SPECIMEN:  No Specimen  DISPOSITION OF SPECIMEN:  N/A  COUNTS:  YES  TOURNIQUET:  * No tourniquets in log *  DICTATION: .Dragon Dictation  PLAN OF CARE: Admit to inpatient   PATIENT DISPOSITION:  ICU - intubated and hemodynamically stable.   Delay start of Pharmacological VTE agent (>24hrs) due to surgical blood loss or risk of bleeding: yes

## 2020-09-22 NOTE — Anesthesia Procedure Notes (Signed)
Procedure Name: Intubation Date/Time: 09/22/2020 7:48 AM Performed by: Mariea Clonts, CRNA Pre-anesthesia Checklist: Patient identified, Emergency Drugs available, Suction available and Patient being monitored Patient Re-evaluated:Patient Re-evaluated prior to induction Oxygen Delivery Method: Circle System Utilized Preoxygenation: Pre-oxygenation with 100% oxygen Induction Type: IV induction Ventilation: Mask ventilation without difficulty Laryngoscope Size: Miller and 2 Grade View: Grade I Tube type: Oral Tube size: 8.0 mm Number of attempts: 1 Airway Equipment and Method: Stylet and Oral airway Placement Confirmation: ETT inserted through vocal cords under direct vision,  positive ETCO2 and breath sounds checked- equal and bilateral Tube secured with: Tape Dental Injury: Teeth and Oropharynx as per pre-operative assessment

## 2020-09-22 NOTE — Progress Notes (Signed)
Patient ID: Juan Hatfield, male   DOB: January 24, 1946, 75 y.o.   MRN: [id number] EVENING ROUNDS NOTE :     [address]       [place] [address]             [phone]                 Day of Surgery Procedure(s) (LRB): CORONARY ARTERY BYPASS GRAFTING (CABG), ON PUMP, TIMES THREE, USING LEFT INTERNAL MAMMARY ARTERY AND ENDOSCOPICALLY HARVESTED RIGHT GREATER SAPHENOUS VEIN (N/A) TRANSESOPHAGEAL ECHOCARDIOGRAM (TEE) (N/A) INDOCYANINE GREEN FLUORESCENCE IMAGING (ICG) (N/A) ENDOVEIN HARVEST OF GREATER SAPHENOUS VEIN (Right)  Total Length of Stay:  LOS: 0 days  BP (!) 141/72   Pulse 95   Temp 100.04 F (37.8 C)   Resp (!) 21   Ht 5\' 6"  (1.676 m)   Wt 106.6 kg   SpO2 100%   BMI 37.93 kg/m   .Intake/Output      02/07 0701 02/08 0700 02/08 0701 02/09 0700   I.V. (mL/kg)  2526 (23.7)   Blood  490   IV Piggyback  645.2   Total Intake(mL/kg)  3661.2 (34.3)   Urine (mL/kg/hr)  1245 (1)   Blood  800   Chest Tube  160   Total Output  2205   Net  +1456.2          . sodium chloride 20 mL/hr at 09/22/20 1800  . [START ON 09/23/2020] sodium chloride    . sodium chloride 20 mL/hr at 09/22/20 1205  . albumin human    . cefUROXime (ZINACEF)  IV    . dexmedetomidine (PRECEDEX) IV infusion Stopped (09/22/20 1328)  . famotidine (PEPCID) IV Stopped (09/22/20 1313)  . insulin 4.4 mL/hr at 09/22/20 1800  . lactated ringers    . lactated ringers    . lactated ringers 20 mL/hr at 09/22/20 1800  . milrinone 0.25 mcg/kg/min (09/22/20 1800)  . nitroGLYCERIN Stopped (09/22/20 1427)  . norepinephrine (LEVOPHED) Adult infusion 8 mcg/min (09/22/20 1800)  . phenylephrine (NEO-SYNEPHRINE) Adult infusion Stopped (09/22/20 1322)  . vancomycin       Lab Results  Component Value Date   WBC 8.4 09/22/2020   HGB 14.6 09/22/2020   HCT 43.0 09/22/2020   PLT 146 (L) 09/22/2020   GLUCOSE 144 (H) 09/22/2020   CHOL 77 (L) 07/15/2020   TRIG 82 07/15/2020   HDL 47 07/15/2020    LDLCALC 13 07/15/2020   ALT 16 09/22/2020   AST 19 09/22/2020   NA 138 09/22/2020   K 4.4 09/22/2020   CL 103 09/22/2020   CREATININE 1.30 (H) 09/22/2020   BUN 16 09/22/2020   CO2 26 09/22/2020   INR 1.4 (H) 09/22/2020   HGBA1C 6.0 (H) 09/11/2020   failued to wean from vent x 2 since surgery,  ci 2.5 Not bleeding- ct 200 out hgb postop Tillson MD  Beeper [phone] Office [phone] 09/22/2020 6:19 PM

## 2020-09-22 NOTE — H&P (Signed)
History and Physical Interval Note:  09/22/2020 7:00 AM  Juan Hatfield  has presented today for surgery, with the diagnosis of CAD.  The various methods of treatment have been discussed with the patient and family. After consideration of risks, benefits and other options for treatment, the patient has consented to  Procedure(s): CORONARY ARTERY BYPASS GRAFTING (CABG) (N/A) TRANSESOPHAGEAL ECHOCARDIOGRAM (TEE) (N/A) INDOCYANINE GREEN FLUORESCENCE IMAGING (ICG) (N/A) as a surgical intervention.  The patient's history has been reviewed, patient examined, no change in status, stable for surgery.  I have reviewed the patient's chart and labs.  Questions were answered to the patient's satisfaction.     [address]       [place] [address]             [phone]     CARDIOTHORACIC SURGERY CONSULTATION REPORT  Referring Provider is No ref. provider found Primary Cardiologist is Fransico Him, MD PCP is Patient, No Pcp Per  No chief complaint on file. Indigestion and shortness of breath with exertion, documented severe coronary artery disease  HPI:  75 year old man with a history of diabetes (hemoglobin A1c 6),  Hypertension, and renal insufficiency noted indigestion shortly after yard work this past fall.  He presented to Dr. Radford Pax with these findings who ordered nuclear stress test, which was positive. This was followed by [facility] 07/20/20 showing severe multivessel CAD. He was referred for CABG but wanted to wait until Jan for surgery. He has been relatively stable with sx for past few weeks on medical therapy. His social hx is notable for Jehovah Witness belief system and will accept autologous blood donation including Cell Saver.   Past Medical History:  Diagnosis Date  . 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 Diabetes mellitus ([place])   . Hypertension   . Patient is Jehovah's Witness   . Renal insufficiency   . Sleep apnea     CPAP nightly    Past Surgical History:  Procedure Laterality Date  . BACK SURGERY    . CARDIAC CATHETERIZATION    . COLONOSCOPY    . COLONOSCOPY WITH PROPOFOL N/A 04/02/2019   Procedure: COLONOSCOPY WITH PROPOFOL;  Surgeon: Wilford Corner, MD;  Location: [facility] ENDOSCOPY;  Service: Endoscopy;  Laterality: N/A;  . LEFT HEART CATH AND CORONARY ANGIOGRAPHY N/A 07/20/2020   Procedure: LEFT HEART CATH AND CORONARY ANGIOGRAPHY;  Surgeon: Lorretta Harp, MD;  Location: [facility] CV LAB;  Service: Cardiovascular;  Laterality: N/A;  . LUMBAR LAMINECTOMY/DECOMPRESSION MICRODISCECTOMY Right 01/01/2015   Procedure: LUMBAR LAMINECTOMY/DECOMPRESSION MICRODISCECTOMY RIGHT LUMBAR FIVE SACRAL ONE;  Surgeon: Jovita Gamma, MD;  Location: [facility] NEURO ORS;  Service: Neurosurgery;  Laterality: Right;  . POLYPECTOMY  04/02/2019   Procedure: POLYPECTOMY;  Surgeon: Wilford Corner, MD;  Location: [facility] ENDOSCOPY;  Service: Endoscopy;;    Family History  Problem Relation Age of Onset  . Cholecystitis Mother   . Lung disease Father   . CAD Brother     Social History   Socioeconomic History  . Marital status: Widowed    Spouse name: Not on file  . Number of children: Not on file  . Years of education: Not on file  . Highest education level: Not on file  Occupational History  . Not on file  Tobacco Use  . Smoking status: Never Smoker  . Smokeless tobacco: Never Used  Vaping Use  . Vaping Use: Never used  Substance and Sexual  Activity  . Alcohol use: Not Currently  . Drug use: Never  . Sexual activity: Not on file  Other Topics Concern  . Not on file  Social History Narrative  . Not on file   Social Determinants of Health   Financial Resource Strain: Not on file  Food Insecurity: Not on file  Transportation Needs: Not on file  Physical Activity: Not on file  Stress: Not on file  Social Connections: Not on file  Intimate Partner Violence: Not on file    Current Facility-Administered  Medications  Medication Dose Route Frequency Provider Last Rate Last Admin  . cefUROXime (ZINACEF) 1.5 g in sodium chloride 0.9 % 100 mL IVPB  1.5 g Intravenous To OR Tayjon Halladay, Megan Presti Z, MD      . cefUROXime (ZINACEF) 750 mg in sodium chloride 0.9 % 100 mL IVPB  750 mg Intravenous To OR Ona Rathert, Glenice Bow, MD      . chlorhexidine (HIBICLENS) 4 % liquid 2 application  30 mL Topical UD Vinayak Bobier, Glenice Bow, MD      . Derrill Memo ON 09/23/2020] chlorhexidine (PERIDEX) 0.12 % solution 15 mL  15 mL Mouth/Throat Once Eleaner Dibartolo, Glenice Bow, MD      . dexmedetomidine (PRECEDEX) 400 MCG/100ML (4 mcg/mL) infusion  0.1-0.7 mcg/kg/hr Intravenous To OR Mavi Un, Glenice Bow, MD      . EPINEPHrine (ADRENALIN) 4 mg in NS 250 mL (0.016 mg/mL) premix infusion  0-10 mcg/min Intravenous To OR Shontay Wallner Z, MD      . heparin 30,000 units/NS 1000 mL solution for CELLSAVER   Other To OR Breindy Meadow, Glenice Bow, MD      . heparin sodium (porcine) 2,500 Units, papaverine 30 mg in electrolyte-148 (PLASMALYTE-148) 500 mL irrigation   Irrigation To OR Jarious Lyon Z, MD      . insulin regular, human (MYXREDLIN) 100 units/ 100 mL infusion   Intravenous To OR Shuayb Schepers Z, MD      . magnesium sulfate (IV Push/IM) injection 40 mEq  40 mEq Other To OR Gor Vestal, Glenice Bow, MD      . metoprolol tartrate (LOPRESSOR) tablet 12.5 mg  12.5 mg Oral Once Kenneshia Rehm, Glenice Bow, MD      . milrinone (PRIMACOR) 20 MG/100 ML (0.2 mg/mL) infusion  0.3 mcg/kg/min Intravenous To OR Hannibal Skalla Z, MD      . nitroGLYCERIN 50 mg in dextrose 5 % 250 mL (0.2 mg/mL) infusion  2-200 mcg/min Intravenous To OR Anagha Loseke, Glenice Bow, MD      . norepinephrine (LEVOPHED) 4mg  in 24mL premix infusion  0-40 mcg/min Intravenous To OR Gearldene Fiorenza Z, MD      . phenylephrine (NEOSYNEPHRINE) 20-0.9 MG/250ML-% infusion  30-200 mcg/min Intravenous To OR Voyd Groft Z, MD      . potassium chloride injection 80 mEq  80 mEq Other To OR Taijah Macrae, Glenice Bow, MD      . tranexamic  acid (CYKLOKAPRON) 2,500 mg in sodium chloride 0.9 % 250 mL (10 mg/mL) infusion  1.5 mg/kg/hr Intravenous To OR Ishita Mcnerney, Glenice Bow, MD      . tranexamic acid (CYKLOKAPRON) bolus via infusion - over 30 minutes 1,614 mg  15 mg/kg Intravenous To OR Melainie Krinsky Z, MD      . tranexamic acid (CYKLOKAPRON) pump prime solution 215 mg  2 mg/kg Intracatheter To OR Christyne Mccain Z, MD      . vancomycin (VANCOREADY) IVPB 1500 mg/300 mL  1,500 mg Intravenous To OR Orvan Seen, Glenice Bow, MD  Facility-Administered Medications Ordered in Other Encounters  Medication Dose Route Frequency Provider Last Rate Last Admin  . fentaNYL citrate (PF) (SUBLIMAZE) injection   Intravenous Anesthesia Intra-op Everlean Cherry A, CRNA   50 mcg at 09/16/20 0756  . lactated ringers infusion   Intravenous Continuous PRN Colin Benton, CRNA   New Bag at 09/16/20 [phone]  . midazolam (VERSED) 5 MG/5ML injection   Intravenous Anesthesia Intra-op Everlean Cherry A, CRNA   1 mg at 09/16/20 9767    Allergies  Allergen Reactions  . Other     No blood or blood products      Review of Systems:   General:  Mildly reduced energy level and no weight change  Cardiac:  Endorses occasional palpitations  Respiratory:  Some shortness of breath with exertion  GI:   Does report frequent heartburn,   GU:   Mild renal insufficiency baseline creatinine 1.5  Vascular:  Negative  Neuro:   No history of stroke or TIAs  Musculoskeletal: Negative  Skin:   Negative  Psych:   Negative  Eyes:   Negative  ENT:    last saw dentist 3 years ago  Hematologic:  Negative  Endocrine:  Positive diabetes, sugars well controlled     Physical Exam:   BP 135/61   Pulse 71   Temp (!) 97.3 F (36.3 C) (Temporal)   Resp 19   Ht 5\' 6"  (1.676 m)   Wt 106.6 kg   SpO2 98%   BMI 37.93 kg/m   General:    well-appearing  HEENT:  Unremarkable   Neck:   no JVD, no bruits, no adenopathy   Chest:   clear to auscultation, symmetrical breath sounds, no  wheezes, no rhonchi   CV:   RRR, no  murmur   Abdomen:  soft, non-tender, no masses   Extremities:  warm, well-perfused, pulses intact, no LE edema  Rectal/GU  Deferred  Neuro:   Grossly non-focal and symmetrical throughout  Skin:   Clean and dry, no rashes, no breakdown   Diagnostic Tests:  I have personally reviewed available imaging studies including echocardiogram from 09/11/2020 and left heart catheterization from 07/20/2020.  I agree with the interpretation provided.  Impression:  75 year old man with chronic stable ischemic heart disease.  He is a good candidate for surgery for myocardial revascularization   Plan:  CABG on 09/22/2020. It will be imperative to practice assiduous blood conservation given his social history and wishes.    I spent in excess of 30 minutes during the conduct of this office consultation and >50% of this time involved direct face-to-face encounter with the patient for counseling and/or coordination of their care.          Level 3 Office Consult = 40 minutes         Level 4 Office Consult = 60 minutes         Level 5 Office Consult = 80 minutes  B.  Murvin Natal, MD 09/22/2020 7:00 AM

## 2020-09-23 ENCOUNTER — Inpatient Hospital Stay (HOSPITAL_COMMUNITY): Payer: Medicare Other

## 2020-09-23 ENCOUNTER — Encounter (HOSPITAL_COMMUNITY): Payer: Self-pay | Admitting: Cardiothoracic Surgery

## 2020-09-23 LAB — BASIC METABOLIC PANEL
Anion gap: 8 (ref 5–15)
Anion gap: 8 (ref 5–15)
BUN: 14 mg/dL (ref 8–23)
BUN: 14 mg/dL (ref 8–23)
CO2: 22 mmol/L (ref 22–32)
CO2: 25 mmol/L (ref 22–32)
Calcium: 7.7 mg/dL — ABNORMAL LOW (ref 8.9–10.3)
Calcium: 8.4 mg/dL — ABNORMAL LOW (ref 8.9–10.3)
Chloride: 107 mmol/L (ref 98–111)
Chloride: 102 mmol/L (ref 98–111)
Creatinine, Ser: 1.45 mg/dL — ABNORMAL HIGH (ref 0.61–1.24)
Creatinine, Ser: 1.47 mg/dL — ABNORMAL HIGH (ref 0.61–1.24)
GFR, Estimated: 51 mL/min — ABNORMAL LOW (ref >60)
GFR, Estimated: 50 mL/min — ABNORMAL LOW (ref >60)
Glucose, Bld: 154 mg/dL — ABNORMAL HIGH (ref 70–99)
Glucose, Bld: 149 mg/dL — ABNORMAL HIGH (ref 70–99)
Potassium: 4.5 mmol/L (ref 3.5–5.1)
Potassium: 4.3 mmol/L (ref 3.5–5.1)
Sodium: 137 mmol/L (ref 135–145)
Sodium: 135 mmol/L (ref 135–145)

## 2020-09-23 LAB — CBC
HCT: 41 % (ref 39.0–52.0)
HCT: 43.7 % (ref 39.0–52.0)
Hemoglobin: 12.5 g/dL — ABNORMAL LOW (ref 13.0–17.0)
Hemoglobin: 13.1 g/dL (ref 13.0–17.0)
MCH: 28.7 pg (ref 26.0–34.0)
MCH: 28.5 pg (ref 26.0–34.0)
MCHC: 30.5 g/dL (ref 30.0–36.0)
MCHC: 30 g/dL (ref 30.0–36.0)
MCV: 94.3 fL (ref 80.0–100.0)
MCV: 95.2 fL (ref 80.0–100.0)
Platelets: 162 10*3/uL (ref 150–400)
Platelets: 175 10*3/uL (ref 150–400)
RBC: 4.35 MIL/uL (ref 4.22–5.81)
RBC: 4.59 MIL/uL (ref 4.22–5.81)
RDW: 11.3 % — ABNORMAL LOW (ref 11.5–15.5)
RDW: 11.5 % (ref 11.5–15.5)
WBC: 12.5 10*3/uL — ABNORMAL HIGH (ref 4.0–10.5)
WBC: 12.8 10*3/uL — ABNORMAL HIGH (ref 4.0–10.5)
nRBC: 0 % (ref 0.0–0.2)
nRBC: 0 % (ref 0.0–0.2)

## 2020-09-23 LAB — GLUCOSE, CAPILLARY
Glucose-Capillary: 136 mg/dL — ABNORMAL HIGH (ref 70–99)
Glucose-Capillary: 152 mg/dL — ABNORMAL HIGH (ref 70–99)
Glucose-Capillary: 160 mg/dL — ABNORMAL HIGH (ref 70–99)
Glucose-Capillary: 114 mg/dL — ABNORMAL HIGH (ref 70–99)
Glucose-Capillary: 125 mg/dL — ABNORMAL HIGH (ref 70–99)
Glucose-Capillary: 136 mg/dL — ABNORMAL HIGH (ref 70–99)
Glucose-Capillary: 85 mg/dL (ref 70–99)
Glucose-Capillary: 167 mg/dL — ABNORMAL HIGH (ref 70–99)
Glucose-Capillary: 197 mg/dL — ABNORMAL HIGH (ref 70–99)
Glucose-Capillary: 190 mg/dL — ABNORMAL HIGH (ref 70–99)
Glucose-Capillary: 150 mg/dL — ABNORMAL HIGH (ref 70–99)
Glucose-Capillary: 144 mg/dL — ABNORMAL HIGH (ref 70–99)
Glucose-Capillary: 173 mg/dL — ABNORMAL HIGH (ref 70–99)
Glucose-Capillary: 178 mg/dL — ABNORMAL HIGH (ref 70–99)

## 2020-09-23 LAB — COOXEMETRY PANEL
Carboxyhemoglobin: 1.2 % (ref 0.5–1.5)
Methemoglobin: 1 % (ref 0.0–1.5)
O2 Saturation: 65.7 %
Total hemoglobin: 11.9 g/dL — ABNORMAL LOW (ref 12.0–16.0)

## 2020-09-23 LAB — MAGNESIUM
Magnesium: 2.3 mg/dL (ref 1.7–2.4)
Magnesium: 2.3 mg/dL (ref 1.7–2.4)

## 2020-09-23 MED ORDER — INSULIN ASPART 100 UNIT/ML ~~LOC~~ SOLN
0.0000 [IU] | SUBCUTANEOUS | Status: DC
  Administered 2020-09-23: 4 [IU] via SUBCUTANEOUS
  Administered 2020-09-24: 12 [IU] via SUBCUTANEOUS
  Administered 2020-09-24 (×2): 4 [IU] via SUBCUTANEOUS

## 2020-09-23 MED ORDER — COLCHICINE 0.3 MG HALF TABLET
0.3000 mg | ORAL_TABLET | Freq: Two times a day (BID) | ORAL | Status: DC
  Administered 2020-09-23 – 2020-09-26 (×7): 0.3 mg via ORAL
  Filled 2020-09-23 (×8): qty 1

## 2020-09-23 MED ORDER — CALCIUM GLUCONATE-NACL 1-0.675 GM/50ML-% IV SOLN
1.0000 g | Freq: Once | INTRAVENOUS | Status: AC
  Administered 2020-09-23: 1000 mg via INTRAVENOUS
  Filled 2020-09-23: qty 50

## 2020-09-23 MED ORDER — MILRINONE LACTATE IN DEXTROSE 20-5 MG/100ML-% IV SOLN
0.1250 ug/kg/min | INTRAVENOUS | Status: DC
  Administered 2020-09-23: 0.125 ug/kg/min via INTRAVENOUS

## 2020-09-23 MED ORDER — ISOSORBIDE DINITRATE 10 MG PO TABS
10.0000 mg | ORAL_TABLET | Freq: Three times a day (TID) | ORAL | Status: DC
  Administered 2020-09-23 – 2020-09-26 (×10): 10 mg via ORAL
  Filled 2020-09-23 (×10): qty 1

## 2020-09-23 MED ORDER — INSULIN DETEMIR 100 UNIT/ML ~~LOC~~ SOLN
18.0000 [IU] | Freq: Two times a day (BID) | SUBCUTANEOUS | Status: DC
  Administered 2020-09-23 – 2020-09-25 (×5): 18 [IU] via SUBCUTANEOUS
  Filled 2020-09-23 (×6): qty 0.18

## 2020-09-23 MED ORDER — INSULIN ASPART 100 UNIT/ML ~~LOC~~ SOLN: 0.0000 [IU] | SUBCUTANEOUS | Status: DC

## 2020-09-23 MED ORDER — CALCIUM GLUCONATE 10 % IV SOLN: 1.0000 g | Freq: Once | INTRAVENOUS | Status: DC

## 2020-09-23 MED FILL — Magnesium Sulfate Inj 50%: INTRAMUSCULAR | Qty: 10 | Status: AC

## 2020-09-23 MED FILL — Sodium Chloride IV Soln 0.9%: INTRAVENOUS | Qty: 2000 | Status: AC

## 2020-09-23 MED FILL — Electrolyte-R (PH 7.4) Solution: INTRAVENOUS | Qty: 4000 | Status: AC

## 2020-09-23 MED FILL — Lidocaine HCl Local Soln Prefilled Syringe 100 MG/5ML (2%): INTRAMUSCULAR | Qty: 5 | Status: AC

## 2020-09-23 MED FILL — Heparin Sodium (Porcine) Inj 1000 Unit/ML: INTRAMUSCULAR | Qty: 30 | Status: AC

## 2020-09-23 MED FILL — Sodium Bicarbonate IV Soln 8.4%: INTRAVENOUS | Qty: 50 | Status: AC

## 2020-09-23 MED FILL — Potassium Chloride Inj 2 mEq/ML: INTRAVENOUS | Qty: 40 | Status: AC

## 2020-09-23 MED FILL — Mannitol IV Soln 20%: INTRAVENOUS | Qty: 500 | Status: AC

## 2020-09-23 NOTE — Progress Notes (Signed)
Inpatient Diabetes Program Recommendations  AACE/ADA: New Consensus Statement on Inpatient Glycemic Control (2015)  Target Ranges:  Prepandial:   less than 140 mg/dL      Peak postprandial:   less than 180 mg/dL (1-2 hours)      Critically ill patients:  140 - 180 mg/dL   Lab Results  Component Value Date   GLUCAP 167 (H) 09/23/2020   HGBA1C 6.0 (H) 09/11/2020    Review of Glycemic Control Results for Juan Hatfield, Juan Hatfield (MRN [id number]) as of 09/23/2020 13:13  Ref. Range 09/23/2020 05:07 09/23/2020 06:08 09/23/2020 08:34 09/23/2020 09:46 09/23/2020 11:31  Glucose-Capillary Latest Ref Range: 70 - 99 mg/dL 114 (H) 125 (H) 136 (H) 85 167 (H)   Diabetes history: DM 2 Outpatient Diabetes medications:  Levemir 30 units daily Janumet 50-1000 - one tablet bid Current orders for Inpatient glycemic control:  IV insulin- Current insulin drip rate is 2.2 units/hr  Inpatient Diabetes Program Recommendations:   Note patient's home dose of Levemir is 30 units daily.  A1C indicates excellent control of DM.  Once patient is ready to transition off insulin drip, consider adding Levemir 18 units bid (give Levemir 1-2 hours prior to d/c of insulin drip) and Novolog moderate q 4 hours.   Thanks,  Adah Perl, RN, BC-ADM Inpatient Diabetes Coordinator Pager [phone] (8a-5p)

## 2020-09-23 NOTE — Plan of Care (Signed)

## 2020-09-23 NOTE — Discharge Instructions (Signed)

## 2020-09-23 NOTE — Discharge Summary (Signed)
[address]       [place] [address]             [phone]    Physician Discharge Summary  Juan Hatfield ID: Juan Hatfield MRN: [id number] DOB/AGE: 1946-02-28 75 y.o.  Admit date: 09/22/2020 Discharge date: 09/26/2020  Admission Diagnoses:  Juan Hatfield Active Problem List   Diagnosis Date Noted  . Chest pain of uncertain etiology   . Personal history of colonic polyps 04/02/2019  . HNP (herniated nucleus pulposus), lumbar 01/01/2015   Discharge Diagnoses:  Juan Hatfield Active Problem List   Diagnosis Date Noted  . 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 Chest pain of uncertain etiology   . Personal history of colonic polyps 04/02/2019  . HNP (herniated nucleus pulposus), lumbar 01/01/2015   Discharged Condition: good  History of Present Illness:  Juan Hatfield is a 75 yo AA male who is Jehovah's witness.  He has known history of DM, HTN and renal insufficiency.  He developed complaints of indigestion after performing yard work this past fall.  He presented to Dr. Radford Pax for evaluation.  She recommended obtaining a stress test which was positive.  Therefore, he underwent cardiac catheterization on 07/20/2020 and this showed multivessel CAD.  It was felt the Juan Hatfield would require coronary bypass grafting procedure and he was referred to [facility] for consultation.  He was initially evaluated by Dr. Kipp Brood at which time it was felt surgical revascularization would be indicated.  His symptoms had remained stable and the Juan Hatfield wished to wait until after the first of the year to proceed with surgery.  The Juan Hatfield was scheduled for surgery, which ultimately ended up having to be rescheduled due an emergency surgery done the evening prior to his surgery.  He was followed up by Dr. Orvan Seen who was agreeable to perform patients surgery.  He spoke with Juan Hatfield in regards to wishes to transfusion protocols.  The risks and benefits of the procedure were explained to the  Juan Hatfield and he was agreeable to proceed.  Hospital course:  Juan Hatfield presented to [facility] on 09/22/2020.  He was taken to the operating room and underwent CABG x 3 utilizing LIMA to LAD, SVG to Diagonal, and SVG to OM.  He also underwent endoscopic harvest of greater saphenous vein from his right leg.  He tolerated the procedure without difficulty and was taken to the SICU in stable condition.  He was extubated the evening of surgery.  During his stay in the SICU the Juan Hatfield was weaned off levophed and Milrinone drips as tolerated.  His chest tubes and arterial lines were removed without difficulty.  His post operative hemoglobin was 13.1.  He was mildly hypertensive and was started on isordil and home Cozaar.  He was diuresed for mild volume overload state.  He was medically stable for transfer to the progressive care unit on 09/25/2020.  He remains stable in NSR.  His pacing wires have been removed. He is diabetic with moderate blood sugar control.  He will be restarted on his home diabetic medications and will require close follow up with primary care physician to optimize diabetes management.  He is ambulating independently.  His surgical incisions are healing without evidence of infection.  He is medically stable for discharge home today.   Significant Diagnostic Studies: angiography:     Ost RCA to Prox RCA lesion is 100% stenosed.  1st Diag-1 lesion is 90% stenosed.  1st Diag-2 lesion  is 95% stenosed.  Prox LAD to Mid LAD lesion is 90% stenosed.  Mid LAD lesion is 70% stenosed.  3rd Mrg lesion is 95% stenosed.  Ramus lesion is 90% stenosed.    Treatments: surgery:    Coronary Artery Bypass Grafting x 3             Left Internal Mammary Artery to Distal Left Anterior Descending Coronary Artery; Saphenous Vein Graft to Obtuse Marginal Branch of Left Circumflex Coronary Artery; Sapheonous Vein Graft to Diagonal Branch Coronary Artery; Endoscopic Vein Harvest from right  thigh and Lower Leg Completion graft surveillance with indocyanine green fluorescence angiography   Discharge Exam: Blood pressure 116/74, pulse 83, temperature 98.4 F (36.9 C), temperature source Oral, resp. rate 20, height 5\' 6"  (1.676 m), weight 103.9 kg, SpO2 99 %.   Cardiovascular: RRR Pulmonary: Slightly diminished bibasilar breath sounds bilaterally Abdomen: Soft, non tender, bowel sounds present. Extremities: Mild bilateral lower extremity edema R>L Wounds: Clean and dry.  No erythema or signs of infection.    Discharge Medications:  The Juan Hatfield has been discharged on:   1.Beta Blocker:  Yes Valu.Nieves   ]                              No   [   ]                              If No, reason:  2.Ace Inhibitor/ARB: Yes [ x  ]                                     No  [    ]                                     If No, reason:  3.Statin:   Yes [ X  ]                  No  [   ]                  If No, reason:  4.Ecasa:  Yes  [  X ]                  No   [   ]                  If No, reason:  Discharge Instructions    Amb Referral to Cardiac Rehabilitation   Complete by: As directed    Diagnosis: CABG   CABG X ___: 3   After initial evaluation and assessments completed: Virtual Based Care may be provided alone or in conjunction with Phase 2 Cardiac Rehab based on Juan Hatfield barriers.: Yes     Allergies as of 09/26/2020      Reactions   Other    No blood or blood products      Medication List    STOP taking these medications   aspirin 81 MG tablet Replaced by: aspirin 325 MG EC tablet     TAKE these medications   acetaminophen 500 MG tablet Commonly known as: TYLENOL Take 2 tablets (1,000 mg total) by mouth every 6 (six) hours as needed.  Alive Energy 50+ Tabs Take 1 tablet by mouth daily.   aspirin 325 MG EC tablet Take 1 tablet (325 mg total) by mouth daily. Replaces: aspirin 81 MG tablet   carvedilol 3.125 MG tablet Commonly known as: COREG Take 1 tablet  (3.125 mg total) by mouth 2 (two) times daily.   Co Q 10 100 MG Caps Take 100 mg by mouth daily.   colchicine 0.6 MG tablet Take 0.5 tablets (0.3 mg total) by mouth 2 (two) times daily. You may discontinue if diarrhea develops   Fish Oil 1000 MG Caps Take 1,000 mg by mouth daily.   furosemide 40 MG tablet Commonly known as: Lasix Take 1 tablet (40 mg total) by mouth daily. For 5 days then take Lasix 20 mg daily thereafter   isosorbide mononitrate 30 MG 24 hr tablet Commonly known as: IMDUR Take 1 tablet (30 mg total) by mouth daily.   Levemir FlexTouch 100 UNIT/ML FlexPen Generic drug: insulin detemir Inject 30 Units into the skin daily.   losartan 25 MG tablet Commonly known as: COZAAR Take 25 mg by mouth daily.   Magnesium 250 MG Tabs Take 250 mg by mouth daily.   milk thistle 175 MG tablet Take 175 mg by mouth daily.   nitroGLYCERIN 0.4 MG SL tablet Commonly known as: Nitrostat Place 1 tablet (0.4 mg total) under the tongue every 5 (five) minutes as needed for chest pain (up to 3 doses).   potassium chloride 10 MEQ tablet Commonly known as: KLOR-CON Take 1 tablet (10 mEq total) by mouth daily.   rosuvastatin 5 MG tablet Commonly known as: CRESTOR Take 5 mg by mouth daily.   sitaGLIPtin-metformin 50-1000 MG tablet Commonly known as: JANUMET Take 1 tablet by mouth 2 (two) times daily with a meal.   traMADol 50 MG tablet Commonly known as: ULTRAM Take 1 tablet (50 mg total) by mouth every 4 (four) hours as needed for moderate pain.       Follow-up Information    Wonda Olds, MD Follow up on 10/12/2020.   Specialty: Cardiothoracic Surgery Why: Appointment is at 3:00 Contact information: [place] [address] [phone]        Sueanne Margarita, MD Follow up on 10/05/2020.   Specialty: Cardiology Why: Appointment is at 2:00 Contact information: [address] [place] [address] [phone]                Signed: Arnoldo Lenis 09/26/2020, 1:38 PM

## 2020-09-23 NOTE — Progress Notes (Signed)
Patient ID: Juan Hatfield, male   DOB: 1946-01-11, 75 y.o.   MRN: [id number]  TCTS Evening Rounds:   Hemodynamically stable  Milrinone 0.125  Ambulated around the ICU  Urine output good  CT output low  CBC    Component Value Date/Time   WBC 12.8 (H) 09/23/2020 1540   RBC 4.59 09/23/2020 1540   HGB 13.1 09/23/2020 1540   HGB 13.6 08/03/2020 1147   HCT 43.7 09/23/2020 1540   HCT 42.7 08/03/2020 1147   PLT 175 09/23/2020 1540   PLT 236 08/03/2020 1147   MCV 95.2 09/23/2020 1540   MCV 90 08/03/2020 1147   MCH 28.5 09/23/2020 1540   MCHC 30.0 09/23/2020 1540   RDW 11.5 09/23/2020 1540   RDW 11.3 (L) 08/03/2020 1147     BMET    Component Value Date/Time   NA 135 09/23/2020 1540   NA 142 08/03/2020 1147   K 4.3 09/23/2020 1540   CL 102 09/23/2020 1540   CO2 25 09/23/2020 1540   GLUCOSE 149 (H) 09/23/2020 1540   BUN 14 09/23/2020 1540   BUN 22 08/03/2020 1147   CREATININE 1.47 (H) 09/23/2020 1540   CALCIUM 8.4 (L) 09/23/2020 1540   GFRNONAA 50 (L) 09/23/2020 1540   GFRAA 54 (L) 08/03/2020 1147     A/P:  Stable postop course. Continue current plans

## 2020-09-23 NOTE — Progress Notes (Signed)
1 Day Post-Op Procedure(s) (LRB): CORONARY ARTERY BYPASS GRAFTING (CABG), ON PUMP, TIMES THREE, USING LEFT INTERNAL MAMMARY ARTERY AND ENDOSCOPICALLY HARVESTED RIGHT GREATER SAPHENOUS VEIN (N/A) TRANSESOPHAGEAL ECHOCARDIOGRAM (TEE) (N/A) INDOCYANINE GREEN FLUORESCENCE IMAGING (ICG) (N/A) ENDOVEIN HARVEST OF GREATER SAPHENOUS VEIN (Right) Subjective: Feeling well  Objective: Vital signs in last 24 hours: Temp:  [95.72 F (35.4 C)-100.22 F (37.9 C)] 98.06 F (36.7 C) (02/09 0700) Pulse Rate:  [67-102] 90 (02/09 0700) Cardiac Rhythm: A-V Sequential paced (02/09 0000) Resp:  [10-37] 22 (02/09 0700) BP: (98-147)/(58-105) 136/85 (02/09 0700) SpO2:  [93 %-100 %] 99 % (02/09 0700) Arterial Line BP: (91-179)/(47-87) 159/70 (02/09 0700) FiO2 (%):  [40 %-50 %] 40 % (02/08 1943) Weight:  [107.4 kg] 107.4 kg (02/09 0500)  Hemodynamic parameters for last 24 hours: PAP: (22-40)/(6-20) 37/15 CO:  [2.8 L/min-6.2 L/min] 5.1 L/min CI:  [1.3 L/min/m2-2.9 L/min/m2] 2.4 L/min/m2  Intake/Output from previous day: 02/08 0701 - 02/09 0700 In: 4324 [I.V.:3088.6; Blood:490; IV Piggyback:745.4] Out: 6945 [Urine:1890; Blood:800; Chest Tube:400] Intake/Output this shift: No intake/output data recorded.  General appearance: alert and cooperative Neurologic: intact Heart: regular rate and rhythm, S1, S2 normal, no murmur, click, rub or gallop Lungs: clear to auscultation bilaterally Abdomen: soft, non-tender; bowel sounds normal; no masses,  no organomegaly Extremities: extremities normal, atraumatic, no cyanosis or edema Wound: c/d/i  Lab Results: Recent Labs    09/22/20 1812 09/22/20 2019 09/22/20 2211 09/23/20 0341  WBC 16.2*  --   --  12.5*  HGB 13.6   < > 13.9 12.5*  HCT 45.4   < > 41.0 41.0  PLT 235  --   --  162   < > = values in this interval not displayed.   BMET:  Recent Labs    09/22/20 1812 09/22/20 2019 09/22/20 2211 09/23/20 0341  NA 138   < > 141 137  K 4.4   < > 4.1  4.5  CL 105  --   --  107  CO2 22  --   --  22  GLUCOSE 170*  --   --  154*  BUN 14  --   --  14  CREATININE 1.67*  --   --  1.45*  CALCIUM 8.1*  --   --  7.7*   < > = values in this interval not displayed.    PT/INR:  Recent Labs    09/22/20 1217  LABPROT 16.4*  INR 1.4*   ABG    Component Value Date/Time   PHART 7.287 (L) 09/22/2020 2211   HCO3 22.7 09/22/2020 2211   TCO2 24 09/22/2020 2211   ACIDBASEDEF 4.0 (H) 09/22/2020 2211   O2SAT 65.7 09/23/2020 0349   CBG (last 3)  Recent Labs    09/23/20 0341 09/23/20 0507 09/23/20 0608  GLUCAP 152* 114* 125*    Assessment/Plan: S/P Procedure(s) (LRB): CORONARY ARTERY BYPASS GRAFTING (CABG), ON PUMP, TIMES THREE, USING LEFT INTERNAL MAMMARY ARTERY AND ENDOSCOPICALLY HARVESTED RIGHT GREATER SAPHENOUS VEIN (N/A) TRANSESOPHAGEAL ECHOCARDIOGRAM (TEE) (N/A) INDOCYANINE GREEN FLUORESCENCE IMAGING (ICG) (N/A) ENDOVEIN HARVEST OF GREATER SAPHENOUS VEIN (Right) Mobilize Diabetes control d/c tubes/lines   LOS: 1 day    Wonda Olds 09/23/2020

## 2020-09-24 ENCOUNTER — Inpatient Hospital Stay (HOSPITAL_COMMUNITY): Payer: Medicare Other

## 2020-09-24 LAB — BASIC METABOLIC PANEL
Anion gap: 8 (ref 5–15)
BUN: 15 mg/dL (ref 8–23)
CO2: 26 mmol/L (ref 22–32)
Calcium: 8.3 mg/dL — ABNORMAL LOW (ref 8.9–10.3)
Chloride: 100 mmol/L (ref 98–111)
Creatinine, Ser: 1.41 mg/dL — ABNORMAL HIGH (ref 0.61–1.24)
GFR, Estimated: 53 mL/min — ABNORMAL LOW (ref >60)
Glucose, Bld: 180 mg/dL — ABNORMAL HIGH (ref 70–99)
Potassium: 4.3 mmol/L (ref 3.5–5.1)
Sodium: 134 mmol/L — ABNORMAL LOW (ref 135–145)

## 2020-09-24 LAB — COOXEMETRY PANEL
Carboxyhemoglobin: 1.6 % — ABNORMAL HIGH (ref 0.5–1.5)
Methemoglobin: 0.9 % (ref 0.0–1.5)
O2 Saturation: 58.8 %
Total hemoglobin: 11.9 g/dL — ABNORMAL LOW (ref 12.0–16.0)

## 2020-09-24 LAB — GLUCOSE, CAPILLARY
Glucose-Capillary: 163 mg/dL — ABNORMAL HIGH (ref 70–99)
Glucose-Capillary: 161 mg/dL — ABNORMAL HIGH (ref 70–99)
Glucose-Capillary: 263 mg/dL — ABNORMAL HIGH (ref 70–99)
Glucose-Capillary: 199 mg/dL — ABNORMAL HIGH (ref 70–99)
Glucose-Capillary: 179 mg/dL — ABNORMAL HIGH (ref 70–99)
Glucose-Capillary: 243 mg/dL — ABNORMAL HIGH (ref 70–99)

## 2020-09-24 MED ORDER — FUROSEMIDE 10 MG/ML IJ SOLN
40.0000 mg | Freq: Two times a day (BID) | INTRAMUSCULAR | Status: DC
  Administered 2020-09-24 (×2): 40 mg via INTRAVENOUS
  Filled 2020-09-24 (×2): qty 4

## 2020-09-24 MED ORDER — INSULIN ASPART 100 UNIT/ML ~~LOC~~ SOLN
0.0000 [IU] | Freq: Three times a day (TID) | SUBCUTANEOUS | Status: DC
  Administered 2020-09-24: 8 [IU] via SUBCUTANEOUS
  Administered 2020-09-24: 4 [IU] via SUBCUTANEOUS
  Administered 2020-09-25: 2 [IU] via SUBCUTANEOUS
  Administered 2020-09-25 (×2): 8 [IU] via SUBCUTANEOUS
  Administered 2020-09-25: 16 [IU] via SUBCUTANEOUS
  Administered 2020-09-26: 4 [IU] via SUBCUTANEOUS
  Administered 2020-09-26: 12 [IU] via SUBCUTANEOUS

## 2020-09-24 NOTE — Plan of Care (Signed)
  Problem: Clinical Measurements: Goal: Ability to maintain clinical measurements within normal limits will improve Outcome: Progressing Goal: Will remain free from infection Outcome: Progressing Goal: Diagnostic test results will improve Outcome: Progressing Goal: Respiratory complications will improve Outcome: Progressing Goal: Cardiovascular complication will be avoided Outcome: Progressing   Problem: Activity: Goal: Risk for activity intolerance will decrease Outcome: Progressing   Problem: Nutrition: Goal: Adequate nutrition will be maintained Outcome: Progressing   Problem: Coping: Goal: Level of anxiety will decrease Outcome: Progressing   Problem: Elimination: Goal: Will not experience complications related to bowel motility Outcome: Progressing Goal: Will not experience complications related to urinary retention Outcome: Progressing   Problem: Pain Managment: Goal: General experience of comfort will improve Outcome: Progressing   

## 2020-09-24 NOTE — Progress Notes (Signed)
2 Days Post-Op Procedure(s) (LRB): CORONARY ARTERY BYPASS GRAFTING (CABG), ON PUMP, TIMES THREE, USING LEFT INTERNAL MAMMARY ARTERY AND ENDOSCOPICALLY HARVESTED RIGHT GREATER SAPHENOUS VEIN (N/A) TRANSESOPHAGEAL ECHOCARDIOGRAM (TEE) (N/A) INDOCYANINE GREEN FLUORESCENCE IMAGING (ICG) (N/A) ENDOVEIN HARVEST OF GREATER SAPHENOUS VEIN (Right) Subjective: No complaints   Objective: Vital signs in last 24 hours: Temp:  [98.6 F (37 C)-99.2 F (37.3 C)] 99.2 F (37.3 C) (02/10 1122) Pulse Rate:  [82-98] 87 (02/10 1600) Cardiac Rhythm: Normal sinus rhythm;Sinus tachycardia (02/10 1100) Resp:  [14-31] 23 (02/10 1600) BP: (117-167)/(77-101) 136/100 (02/10 1500) SpO2:  [94 %-100 %] 97 % (02/10 1600) Weight:  [107.4 kg] 107.4 kg (02/10 0359)  Hemodynamic parameters for last 24 hours:    Intake/Output from previous day: 02/09 0701 - 02/10 0700 In: 1158.1 [P.O.:120; I.V.:802.3; IV Piggyback:235.8] Out: 1345 [Urine:985; Chest Tube:360] Intake/Output this shift: Total I/O In: 633.5 [P.O.:480; I.V.:53.5; IV Piggyback:100] Out: 1515 [Urine:1515]  General appearance: alert and cooperative Neurologic: intact Heart: regular rate and rhythm, S1, S2 normal, no murmur, click, rub or gallop Lungs: clear to auscultation bilaterally Abdomen: soft, non-tender; bowel sounds normal; no masses,  no organomegaly Extremities: edema mild Wound: c/d/i  Lab Results: Recent Labs    09/23/20 0341 09/23/20 1540  WBC 12.5* 12.8*  HGB 12.5* 13.1  HCT 41.0 43.7  PLT 162 175   BMET:  Recent Labs    09/23/20 1540 09/24/20 0530  NA 135 134*  K 4.3 4.3  CL 102 100  CO2 25 26  GLUCOSE 149* 180*  BUN 14 15  CREATININE 1.47* 1.41*  CALCIUM 8.4* 8.3*    PT/INR:  Recent Labs    09/22/20 1217  LABPROT 16.4*  INR 1.4*   ABG    Component Value Date/Time   PHART 7.287 (L) 09/22/2020 2211   HCO3 22.7 09/22/2020 2211   TCO2 24 09/22/2020 2211   ACIDBASEDEF 4.0 (H) 09/22/2020 2211   O2SAT 58.8  09/24/2020 0530   CBG (last 3)  Recent Labs    09/24/20 0820 09/24/20 1119 09/24/20 1612  GLUCAP 263* 199* 179*    Assessment/Plan: S/P Procedure(s) (LRB): CORONARY ARTERY BYPASS GRAFTING (CABG), ON PUMP, TIMES THREE, USING LEFT INTERNAL MAMMARY ARTERY AND ENDOSCOPICALLY HARVESTED RIGHT GREATER SAPHENOUS VEIN (N/A) TRANSESOPHAGEAL ECHOCARDIOGRAM (TEE) (N/A) INDOCYANINE GREEN FLUORESCENCE IMAGING (ICG) (N/A) ENDOVEIN HARVEST OF GREATER SAPHENOUS VEIN (Right) Mobilize Diuresis See progression orders   LOS: 2 days    Wonda Olds 09/24/2020

## 2020-09-24 NOTE — Plan of Care (Signed)
  Problem: Health Behavior/Discharge Planning: Goal: Ability to manage health-related needs will improve Outcome: Progressing   Problem: Clinical Measurements: Goal: Ability to maintain clinical measurements within normal limits will improve Outcome: Progressing Goal: Will remain free from infection Outcome: Progressing Goal: Diagnostic test results will improve Outcome: Progressing Goal: Respiratory complications will improve Outcome: Progressing Goal: Cardiovascular complication will be avoided Outcome: Progressing   Problem: Activity: Goal: Risk for activity intolerance will decrease Outcome: Progressing   Problem: Elimination: Goal: Will not experience complications related to bowel motility Outcome: Progressing   Problem: Pain Managment: Goal: General experience of comfort will improve Outcome: Progressing

## 2020-09-25 ENCOUNTER — Inpatient Hospital Stay (HOSPITAL_COMMUNITY): Payer: Medicare Other

## 2020-09-25 LAB — BASIC METABOLIC PANEL
Anion gap: 8 (ref 5–15)
BUN: 17 mg/dL (ref 8–23)
CO2: 31 mmol/L (ref 22–32)
Calcium: 8.6 mg/dL — ABNORMAL LOW (ref 8.9–10.3)
Chloride: 94 mmol/L — ABNORMAL LOW (ref 98–111)
Creatinine, Ser: 1.59 mg/dL — ABNORMAL HIGH (ref 0.61–1.24)
GFR, Estimated: 46 mL/min — ABNORMAL LOW (ref >60)
Glucose, Bld: 181 mg/dL — ABNORMAL HIGH (ref 70–99)
Potassium: 3.9 mmol/L (ref 3.5–5.1)
Sodium: 133 mmol/L — ABNORMAL LOW (ref 135–145)

## 2020-09-25 LAB — GLUCOSE, CAPILLARY
Glucose-Capillary: 159 mg/dL — ABNORMAL HIGH (ref 70–99)
Glucose-Capillary: 216 mg/dL — ABNORMAL HIGH (ref 70–99)
Glucose-Capillary: 225 mg/dL — ABNORMAL HIGH (ref 70–99)
Glucose-Capillary: 314 mg/dL — ABNORMAL HIGH (ref 70–99)
Glucose-Capillary: 203 mg/dL — ABNORMAL HIGH (ref 70–99)

## 2020-09-25 MED ORDER — CHLORHEXIDINE GLUCONATE CLOTH 2 % EX PADS
6.0000 | MEDICATED_PAD | Freq: Every day | CUTANEOUS | Status: DC
  Administered 2020-09-25: 6 via TOPICAL

## 2020-09-25 MED ORDER — PHENOL 1.4 % MT LIQD: 1.0000 | OROMUCOSAL | Status: DC | PRN

## 2020-09-25 MED ORDER — ACETAMINOPHEN 500 MG PO TABS: 1000.0000 mg | ORAL_TABLET | Freq: Four times a day (QID) | ORAL | 0 refills | Status: DC | PRN

## 2020-09-25 MED ORDER — POTASSIUM CHLORIDE ER 10 MEQ PO TBCR
10.0000 meq | EXTENDED_RELEASE_TABLET | Freq: Every day | ORAL | 0 refills | Status: DC
Start: 2020-09-25 — End: 2020-10-01

## 2020-09-25 MED ORDER — TRAMADOL HCL 50 MG PO TABS: 50.0000 mg | ORAL_TABLET | ORAL | 0 refills | Status: DC | PRN

## 2020-09-25 MED ORDER — ASPIRIN 325 MG PO TBEC: 325.0000 mg | DELAYED_RELEASE_TABLET | Freq: Every day | ORAL | 0 refills | Status: DC

## 2020-09-25 MED ORDER — INSULIN DETEMIR 100 UNIT/ML ~~LOC~~ SOLN
22.0000 [IU] | Freq: Two times a day (BID) | SUBCUTANEOUS | Status: DC
  Administered 2020-09-25 – 2020-09-26 (×2): 22 [IU] via SUBCUTANEOUS
  Filled 2020-09-25 (×4): qty 0.22

## 2020-09-25 MED ORDER — COLCHICINE 0.6 MG PO TABS: 0.3000 mg | ORAL_TABLET | Freq: Two times a day (BID) | ORAL | 0 refills | Status: DC

## 2020-09-25 MED ORDER — LOSARTAN POTASSIUM 25 MG PO TABS
25.0000 mg | ORAL_TABLET | Freq: Every day | ORAL | Status: DC
  Administered 2020-09-25 – 2020-09-26 (×2): 25 mg via ORAL
  Filled 2020-09-25 (×2): qty 1

## 2020-09-25 MED ORDER — FUROSEMIDE 20 MG PO TABS: 20.0000 mg | ORAL_TABLET | Freq: Every day | ORAL | 0 refills | Status: DC

## 2020-09-25 MED ORDER — FUROSEMIDE 10 MG/ML IJ SOLN
40.0000 mg | Freq: Every day | INTRAMUSCULAR | Status: DC
  Administered 2020-09-25 – 2020-09-26 (×2): 40 mg via INTRAVENOUS
  Filled 2020-09-25 (×2): qty 4

## 2020-09-25 NOTE — Plan of Care (Signed)

## 2020-09-25 NOTE — Progress Notes (Signed)
CARDIAC REHAB PHASE I   PRE:  Rate/Rhythm: 91 SR  BP:  Supine:   Sitting:   Standing:    SaO2: 97%RA  MODE:  Ambulation: 800 ft   POST:  Rate/Rhythm: 91 SR  BP:  Supine:   Sitting: 144/79  Standing:    SaO2: 99%RA 1445-1516 Pt walked 800 ft on RA with rolling walker with steady gait. Tolerated well. Has walker at home if needed. To recliner with call bell. Discussed sternal precautions and staying in the tube. Gave diabetic and heart healthy diets, walking instructions for ex, discussed CRP 2 and importance of IS. Pt able to get to 1000 ml correctly.  Voiced understanding of ed.   Graylon Good, RN BSN  09/25/2020 3:08 PM

## 2020-09-25 NOTE — Progress Notes (Signed)
3 Days Post-Op Procedure(s) (LRB): CORONARY ARTERY BYPASS GRAFTING (CABG), ON PUMP, TIMES THREE, USING LEFT INTERNAL MAMMARY ARTERY AND ENDOSCOPICALLY HARVESTED RIGHT GREATER SAPHENOUS VEIN (N/A) TRANSESOPHAGEAL ECHOCARDIOGRAM (TEE) (N/A) INDOCYANINE GREEN FLUORESCENCE IMAGING (ICG) (N/A) ENDOVEIN HARVEST OF GREATER SAPHENOUS VEIN (Right) Subjective: No complaints Objective: Vital signs in last 24 hours: Temp:  [98.7 F (37.1 C)-99.8 F (37.7 C)] 98.7 F (37.1 C) (02/11 0801) Pulse Rate:  [84-98] 85 (02/11 0800) Cardiac Rhythm: Normal sinus rhythm (02/10 1938) Resp:  [20-31] 29 (02/11 0800) BP: (117-170)/(75-110) 133/80 (02/11 0800) SpO2:  [95 %-100 %] 98 % (02/11 0800) Weight:  [104.7 kg] 104.7 kg (02/11 0528)  Hemodynamic parameters for last 24 hours:    Intake/Output from previous day: 02/10 0701 - 02/11 0700 In: 993.5 [P.O.:840; I.V.:53.5; IV Piggyback:100] Out: 2975 [Urine:2975] Intake/Output this shift: No intake/output data recorded.  General appearance: alert and cooperative Neurologic: intact Heart: regular rate and rhythm, S1, S2 normal, no murmur, click, rub or gallop Lungs: clear to auscultation bilaterally Abdomen: soft, non-tender; bowel sounds normal; no masses,  no organomegaly Extremities: extremities normal, atraumatic, no cyanosis or edema Wound: c/d/i  Lab Results: Recent Labs    09/23/20 0341 09/23/20 1540  WBC 12.5* 12.8*  HGB 12.5* 13.1  HCT 41.0 43.7  PLT 162 175   BMET:  Recent Labs    09/24/20 0530 09/25/20 0136  NA 134* 133*  K 4.3 3.9  CL 100 94*  CO2 26 31  GLUCOSE 180* 181*  BUN 15 17  CREATININE 1.41* 1.59*  CALCIUM 8.3* 8.6*    PT/INR:  Recent Labs    09/22/20 1217  LABPROT 16.4*  INR 1.4*   ABG    Component Value Date/Time   PHART 7.287 (L) 09/22/2020 2211   HCO3 22.7 09/22/2020 2211   TCO2 24 09/22/2020 2211   ACIDBASEDEF 4.0 (H) 09/22/2020 2211   O2SAT 58.8 09/24/2020 0530   CBG (last 3)  Recent Labs     09/24/20 2215 09/25/20 0546 09/25/20 0803  GLUCAP 243* 159* 216*    Assessment/Plan: S/P Procedure(s) (LRB): CORONARY ARTERY BYPASS GRAFTING (CABG), ON PUMP, TIMES THREE, USING LEFT INTERNAL MAMMARY ARTERY AND ENDOSCOPICALLY HARVESTED RIGHT GREATER SAPHENOUS VEIN (N/A) TRANSESOPHAGEAL ECHOCARDIOGRAM (TEE) (N/A) INDOCYANINE GREEN FLUORESCENCE IMAGING (ICG) (N/A) ENDOVEIN HARVEST OF GREATER SAPHENOUS VEIN (Right) Mobilize Diuresis Plan for discharge: see discharge orders   LOS: 3 days    Wonda Olds 09/25/2020

## 2020-09-25 NOTE — Progress Notes (Signed)
Inpatient Diabetes Program Recommendations  AACE/ADA: New Consensus Statement on Inpatient Glycemic Control (2015)  Target Ranges:  Prepandial:   less than 140 mg/dL      Peak postprandial:   less than 180 mg/dL (1-2 hours)      Critically ill patients:  140 - 180 mg/dL   Lab Results  Component Value Date   GLUCAP 216 (H) 09/25/2020   HGBA1C 6.0 (H) 09/11/2020    Review of Glycemic Control Results for Juan Hatfield, Juan Hatfield (MRN [id number]) as of 09/25/2020 09:49  Ref. Range 09/24/2020 11:19 09/24/2020 16:12 09/24/2020 22:15 09/25/2020 05:46 09/25/2020 08:03  Glucose-Capillary Latest Ref Range: 70 - 99 mg/dL 199 (H) 179 (H) 243 (H) 159 (H) 216 (H)  Diabetes history: DM 2 Outpatient Diabetes medications:  Levemir 30 units daily Janumet 50-1000 - one tablet bid Current orders for Inpatient glycemic control:  Levemir 18 units bid, TCTS tid with meals and HS Inpatient Diabetes Program Recommendations:    Please consider increasing Levemir to 22 units bid.   Thanks, Adah Perl, RN, BC-ADM Inpatient Diabetes Coordinator Pager [phone] (8a-5p)

## 2020-09-25 NOTE — TOC Progression Note (Signed)
Transition of Care [facility]) - Progression Note    Patient Details  Name: Juan Hatfield MRN: [id number] Date of Birth: 01-May-1946  [facility]) CM/SW Contact  Zenon Mayo, RN Phone Number: 09/25/2020, 3:49 PM  Clinical Narrative:    NCM spoke with Patient, he states his PCP is Dr Wynn Banker with [facility], he lives with his daughter,and his sister lives with him.  He has a walker at home.  TOC will continue to follow for dc needs.        Expected Discharge Plan and Services                                                 Social Determinants of Health (SDOH) Interventions    Readmission Risk Interventions No flowsheet data found.

## 2020-09-26 LAB — GLUCOSE, CAPILLARY
Glucose-Capillary: 186 mg/dL — ABNORMAL HIGH (ref 70–99)
Glucose-Capillary: 291 mg/dL — ABNORMAL HIGH (ref 70–99)

## 2020-09-26 MED ORDER — FUROSEMIDE 40 MG PO TABS: 40.0000 mg | ORAL_TABLET | Freq: Every day | ORAL | 0 refills | Status: DC

## 2020-09-26 NOTE — Progress Notes (Addendum)
      [address]       [place] [address]             [phone]        4 Days Post-Op Procedure(s) (LRB): CORONARY ARTERY BYPASS GRAFTING (CABG), ON PUMP, TIMES THREE, USING LEFT INTERNAL MAMMARY ARTERY AND ENDOSCOPICALLY HARVESTED RIGHT GREATER SAPHENOUS VEIN (N/A) TRANSESOPHAGEAL ECHOCARDIOGRAM (TEE) (N/A) INDOCYANINE GREEN FLUORESCENCE IMAGING (ICG) (N/A) ENDOVEIN HARVEST OF GREATER SAPHENOUS VEIN (Right)  Subjective: Patient sitting in chair, without complaints this am.  Objective: Vital signs in last 24 hours: Temp:  [98.3 F (36.8 C)-99 F (37.2 C)] 99 F (37.2 C) (02/12 0806) Pulse Rate:  [83-98] 89 (02/12 0806) Cardiac Rhythm: Normal sinus rhythm (02/12 0439) Resp:  [17-26] 19 (02/12 0806) BP: (120-137)/(64-84) 137/77 (02/12 0806) SpO2:  [97 %-100 %] 97 % (02/12 0806) Weight:  [103.9 kg] 103.9 kg (02/12 0556)  Pre op weight 106.6 kg Current Weight  09/26/20 103.9 kg      Intake/Output from previous day: 02/11 0701 - 02/12 0700 In: -  Out: 400 [Urine:400]   Physical Exam:  Cardiovascular: RRR Pulmonary: Slightly diminished bibasilar breath sounds bilaterally Abdomen: Soft, non tender, bowel sounds present. Extremities: Mild bilateral lower extremity edema R>L Wounds: Clean and dry.  No erythema or signs of infection.  Lab Results: CBC: Recent Labs    09/23/20 1540  WBC 12.8*  HGB 13.1  HCT 43.7  PLT 175   BMET:  Recent Labs    09/24/20 0530 09/25/20 0136  NA 134* 133*  K 4.3 3.9  CL 100 94*  CO2 26 31  GLUCOSE 180* 181*  BUN 15 17  CREATININE 1.41* 1.59*  CALCIUM 8.3* 8.6*    PT/INR:  Lab Results  Component Value Date   INR 1.4 (H) 09/22/2020   INR 1.1 09/11/2020   ABG:  INR: Will add last result for INR, ABG once components are confirmed Will add last 4 CBG results once components are confirmed  Assessment/Plan:  1. CV - SR, first degree heart block. On Lopressor 12.5 mg bid, Isordil 10 mg tid, and  Losartan 25 mg daily. He was on Coreg prior to surgery so will stop Lopressor and restart it.  2.  Pulmonary - On room air. Encourage incentive spirometer. 3. Volume Overload - On Lasix 40 mg IV daily. Will need further diuresis at discharge. 4. DM-CBGs 314/203/186. On Insulin but will increase for better glucose control.. Pre op HGA1C 6. 5. History of renal insufficiency-Last creatinine up to 1.59. His admission creatinine was 1.6. 6. Possible discharge later today   Donielle M ZimmermanPA-C 09/26/2020,10:28 AM   I have seen and examined the patient and agree with the assessment and plan as outlined.  D/C home  Rexene Alberts, MD 09/26/2020 12:03 PM

## 2020-09-26 NOTE — Progress Notes (Signed)
CARDIAC REHAB PHASE I   PRE:  Rate/Rhythm: 87 SR  BP:  Supine:   Sitting: 116/74  Standing:    SaO2:  MODE:  Ambulation: 800 ft   POST:  Rate/Rhythm: 112 ST  BP:  Supine:   Sitting: 143/67  Standing:    SaO2: 95%RA 1132-1200 Pt walked 800 ft on RA with rolling walker independently. Tolerated well. Reinforced sternal precautions. Encouraged no bike riding for at least a month. Has walking instructions.   Graylon Good, RN BSN  09/26/2020 11:55 AM

## 2020-09-28 ENCOUNTER — Other Ambulatory Visit: Payer: Self-pay

## 2020-09-28 ENCOUNTER — Telehealth: Payer: Self-pay

## 2020-09-28 MED ORDER — CARVEDILOL 3.125 MG PO TABS: 3.1250 mg | ORAL_TABLET | Freq: Two times a day (BID) | ORAL | 1 refills | Status: AC

## 2020-09-28 NOTE — Telephone Encounter (Signed)
Spoke with patient's daughter, Andee Poles who was concerned about patient.  She contacted the on call physician about a medication, Carvedilol, that was not filled at the pharmacy.  She stated that she spoke with the on call physician who stated that "it would be ok" for him to not take it for a couple days and that "someone would be getting back in touch with him".  Patient is s/p CABG with Dr. Orvan Seen 09/22/20 and was recently discharged.  She stated that her father was not one to complain, but that he didn't look to be doing well.  Stated that he was in a lot of pain, sore throat, and he was short of breath.  She stated that her father did not complain a lot, and she felt he was down-grading his symptoms.  She asked if I could give him a call and speak with him.  Advised that he would be contacted and that the medication, Carvedilol, would be sent into his pharmacy, [facility] in [place], she acknowledged receipt.  Spoke with patient, he did not seem short of breath when talking and had no concerns.  He stated that he does have some pain, but that he is taking the medication "at times".  Advised that if he was hurting to take the medication as it would help with recovery. He acknowledged receipt.  He also stated that he did have a sore throat, but that it was improving.  Advised that he could take OTC cough sprays, which he stated that he was taking.  He also stated that he was some short of breath while walking but not sitting.  Advised that it is to be expected in the beginning but should improve with time.  He acknowledged receipt.  Advised to contact the office if he had any questions or concerns going forward.

## 2020-09-29 ENCOUNTER — Telehealth (HOSPITAL_COMMUNITY): Payer: Self-pay

## 2020-09-29 NOTE — Telephone Encounter (Signed)
Pt insurance is active and benefits verified through [facility] Medicare. Co-pay $0.00, DED $0.00/$0.00 met, out of pocket $4,500.00/$76.25 met, co-insurance 0%. No pre-authorization required. Passport, 09/29/20 @ 1:35PM, HTX#[id number]  Will contact patient to see if he is interested in the Cardiac Rehab Program. If interested, patient will need to complete follow up appt. Once completed, patient will be contacted for scheduling upon review by the RN Navigator.

## 2020-09-29 NOTE — Telephone Encounter (Signed)
Called patient to see if he is interested in the Cardiac Rehab Program. Patient expressed interest. Explained scheduling process and went over insurance, patient verbalized understanding. Will contact patient for scheduling once f/u has been completed.  °

## 2020-10-01 ENCOUNTER — Other Ambulatory Visit: Payer: Self-pay

## 2020-10-01 ENCOUNTER — Ambulatory Visit
Admission: RE | Admit: 2020-10-01 | Discharge: 2020-10-01 | Disposition: A | Discharge status: Home / Self Care | Payer: Medicare Other | Source: Ambulatory Visit | Attending: Cardiothoracic Surgery | Admitting: Cardiothoracic Surgery

## 2020-10-01 ENCOUNTER — Telehealth: Payer: Self-pay | Admitting: *Deleted

## 2020-10-01 ENCOUNTER — Ambulatory Visit: Payer: Self-pay | Admitting: Cardiothoracic Surgery

## 2020-10-01 ENCOUNTER — Other Ambulatory Visit: Payer: Self-pay | Admitting: Cardiothoracic Surgery

## 2020-10-01 VITALS — BP 157/98 | HR 99 | Temp 97.0°F | Resp 20 | Ht 66.0 in | Wt 231.0 lb

## 2020-10-01 DIAGNOSIS — Z951 Presence of aortocoronary bypass graft: Secondary | ICD-10-CM

## 2020-10-01 MED ORDER — DOCUSATE SODIUM 100 MG PO CAPS: 100.0000 mg | ORAL_CAPSULE | Freq: Two times a day (BID) | ORAL | 2 refills | Status: DC

## 2020-10-01 MED ORDER — OXYCODONE-ACETAMINOPHEN 5-325 MG PO TABS: 1.0000 | ORAL_TABLET | ORAL | 0 refills | Status: DC | PRN

## 2020-10-01 NOTE — Telephone Encounter (Signed)
Pt's daughter, Juan Hatfield, called stating Juan Hatfield has been nauseous with an upset stomach, vomiting, and is unable to control his pain. Pt was called to discuss symptoms. Per pt, he has been taking tramadol every 4 hours and prescribed with Tylenol in between for pain relief. This regimen is unable to provide much relief. Pt states he is also very nauseous, has vomited and is unable to keep much down. He states the nausea happens after taking his medications in the morning. Advised pt to eat prior to medication and to eat hearty food or something such as bread. Pt states he is unable to do that due to the diabetic diet he is on. Pt states his glucose has been running in the 200's. Per pt, this mornings glucose was 503 prior to breakfast. Pt states he has been taking all his medications as prescribed. Dr. Orvan Seen notified. Appointment has been scheduled for today to be seen by Dr. Orvan Seen. Order for chest xray placed. Juan Hatfield and his daughter Juan Hatfield aware of chest xray and appointment. No further questions at this time.

## 2020-10-01 NOTE — Progress Notes (Signed)
[address]       [place], [address]             [phone]     CARDIOTHORACIC SURGERY OFFICE NOTE  Referring Provider is No ref. provider found Primary Cardiologist is Fransico Him, MD PCP is Wynelle Fanny, DO   HPI:  S/p CABG several days ago; reports pain in incisional area and n/v Denies f/c Denies angina or SOB   Current Outpatient Medications  Medication Sig Dispense Refill  . acetaminophen (TYLENOL) 500 MG tablet Take 2 tablets (1,000 mg total) by mouth every 6 (six) hours as needed. 30 tablet 0  . aspirin EC 325 MG EC tablet Take 1 tablet (325 mg total) by mouth daily. 30 tablet 0  . carvedilol (COREG) 3.125 MG tablet Take 1 tablet (3.125 mg total) by mouth 2 (two) times daily. 60 tablet 1  . Coenzyme Q10 (CO Q 10) 100 MG CAPS Take 100 mg by mouth daily.     Marland Kitchen docusate sodium (COLACE) 100 MG capsule Take 1 capsule (100 mg total) by mouth 2 (two) times daily. 60 capsule 2  . LEVEMIR FLEXTOUCH 100 UNIT/ML FlexPen Inject 30 Units into the skin daily.     Marland Kitchen losartan (COZAAR) 25 MG tablet Take 25 mg by mouth daily.    . Magnesium 250 MG TABS Take 250 mg by mouth daily.    . milk thistle 175 MG tablet Take 175 mg by mouth daily.    . Multiple Vitamins-Minerals (ALIVE ENERGY 50+) TABS Take 1 tablet by mouth daily.    . nitroGLYCERIN (NITROSTAT) 0.4 MG SL tablet Place 1 tablet (0.4 mg total) under the tongue every 5 (five) minutes as needed for chest pain (up to 3 doses). 25 tablet 0  . Omega-3 Fatty Acids (FISH OIL) 1000 MG CAPS Take 1,000 mg by mouth daily.    Marland Kitchen oxyCODONE-acetaminophen (PERCOCET) 5-325 MG tablet Take 1 tablet by mouth every 4 (four) hours as needed for severe pain. 20 tablet 0  . rosuvastatin (CRESTOR) 5 MG tablet Take 5 mg by mouth daily.    . sitaGLIPtin-metformin (JANUMET) 50-1000 MG per tablet Take 1 tablet by mouth 2 (two) times daily with a meal.    . traMADol (ULTRAM) 50 MG tablet Take 1 tablet (50 mg total) by mouth every 4  (four) hours as needed for moderate pain. 30 tablet 0   No current facility-administered medications for this visit.   Facility-Administered Medications Ordered in Other Visits  Medication Dose Route Frequency Provider Last Rate Last Admin  . fentaNYL citrate (PF) (SUBLIMAZE) injection   Intravenous Anesthesia Intra-op Everlean Cherry A, CRNA   50 mcg at 09/16/20 0756  . lactated ringers infusion   Intravenous Continuous PRN Colin Benton, CRNA   New Bag at 09/16/20 [phone]  . midazolam (VERSED) 5 MG/5ML injection   Intravenous Anesthesia Intra-op Everlean Cherry A, CRNA   1 mg at 09/16/20 7322      Physical Exam:   BP (!) 157/98 (BP Location: Right Arm, Patient Position: Sitting)   Pulse 99   Temp (!) 97 F (36.1 C)   Resp 20   Ht 5\' 6"  (1.676 m)   Wt 104.8 kg   SpO2 95% Comment: RA  BMI 37.28 kg/m   General:  Well-appearing, NAd  Chest:   cta  CV:   rrr  Incisions:  Healing well  Abdomen:  Mildly distended, NT  Extremities:  Mild RLE edema  Diagnostic  Tests:  CXR shows clear lung fields and stable cardiac silhouette   Impression:  Doing reasonably well after surgery; likely sx mostly due to constipation  Plan:  F/u as scheduled D/c lasix/potassium, colchicine, and isosorbide Bowel care Prescription given for oxycodone  I spent in excess of 20 minutes during the conduct of this office consultation and >50% of this time involved direct face-to-face encounter with the patient for counseling and/or coordination of their care.  Level 2                 10 minutes Level 3                 15 minutes Level 4                 25 minutes Level 5                 40 minutes  B. Murvin Natal, MD 10/01/2020 1:36 PM

## 2020-10-05 ENCOUNTER — Encounter: Payer: Self-pay | Admitting: Cardiology

## 2020-10-05 ENCOUNTER — Ambulatory Visit: Payer: Medicare Other | Admitting: Cardiology

## 2020-10-05 ENCOUNTER — Other Ambulatory Visit: Payer: Self-pay

## 2020-10-05 VITALS — BP 136/78 | HR 100 | Ht 66.0 in | Wt 234.8 lb

## 2020-10-05 DIAGNOSIS — R110 Nausea: Secondary | ICD-10-CM

## 2020-10-05 DIAGNOSIS — E7800 Pure hypercholesterolemia, unspecified: Secondary | ICD-10-CM | POA: Diagnosis not present

## 2020-10-05 DIAGNOSIS — I10 Essential (primary) hypertension: Secondary | ICD-10-CM | POA: Diagnosis not present

## 2020-10-05 DIAGNOSIS — I2583 Coronary atherosclerosis due to lipid rich plaque: Secondary | ICD-10-CM

## 2020-10-05 DIAGNOSIS — I2510 Atherosclerotic heart disease of native coronary artery without angina pectoris: Secondary | ICD-10-CM

## 2020-10-05 DIAGNOSIS — E119 Type 2 diabetes mellitus without complications: Secondary | ICD-10-CM

## 2020-10-05 NOTE — Progress Notes (Addendum)
Cardiology Consult  Note    Date:  10/05/2020   ID:  Juan, Hatfield 31-May-1946, MRN [id number]  PCP:  Wynelle Fanny, DO  Cardiologist:  Juan Him, MD   Chief Complaint  Patient presents with  . Coronary Artery Disease  . Hyperlipidemia    History of Present Illness:  Juan Hatfield is a 75 y.o. male with a hx of DM, HTN, ?renal insufficiency (Cr 1.67 by labs 2020), RBBB, and refusal of blood products - he is a Jehovah's witness who was initially referred for evaluation of chest pain. Coronary calcium score was elevated. Nuclear study abnormal and he was referred for cardiac cath which showed 3VD with preserved LV function - faint collaterals from left circulation to distal RCA. He was placed on Imdur - to consider CABG if symptoms persist.   He was seen by Truitt Merle, NP in Dec 2021 and had had some chest pressure with exertion but not any of the other chest pain he had hda but was not very active. He was referred back to CVTS and underwent CABG x 3 utilizing LIMA to LAD, SVG to Diagonal, and SVG to OM 09/22/2020.  He is here today for followup and is doing well.  He denies any chest pain or pressure, SOB, DOE, PND, orthopnea, LE edema, dizziness, palpitations or syncope. He is compliant with his meds and is tolerating meds with no SE.    Past Medical History:  Diagnosis Date  . 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 Diabetes mellitus ([place])   . Hypertension   . Patient is Jehovah's Witness   . Renal insufficiency   . Sleep apnea    CPAP nightly    Past Surgical History:  Procedure Laterality Date  . BACK SURGERY    . CARDIAC CATHETERIZATION    . COLONOSCOPY    . COLONOSCOPY WITH PROPOFOL N/A 04/02/2019   Procedure: COLONOSCOPY WITH PROPOFOL;  Surgeon: Wilford Corner, MD;  Location: [facility];  Service: Endoscopy;  Laterality: N/A;  . CORONARY ARTERY BYPASS GRAFT N/A 09/22/2020   Procedure: CORONARY ARTERY BYPASS GRAFTING (CABG), ON PUMP, TIMES THREE,  USING LEFT INTERNAL MAMMARY ARTERY AND ENDOSCOPICALLY HARVESTED RIGHT GREATER SAPHENOUS VEIN;  Surgeon: Wonda Olds, MD;  Location: [facility];  Service: Open Heart Surgery;  Laterality: N/A;  . ENDOVEIN HARVEST OF GREATER SAPHENOUS VEIN Right 09/22/2020   Procedure: ENDOVEIN HARVEST OF GREATER SAPHENOUS VEIN;  Surgeon: Wonda Olds, MD;  Location: [facility];  Service: Open Heart Surgery;  Laterality: Right;  . LEFT HEART CATH AND CORONARY ANGIOGRAPHY N/A 07/20/2020   Procedure: LEFT HEART CATH AND CORONARY ANGIOGRAPHY;  Surgeon: Lorretta Harp, MD;  Location: [facility];  Service: Cardiovascular;  Laterality: N/A;  . LUMBAR LAMINECTOMY/DECOMPRESSION MICRODISCECTOMY Right 01/01/2015   Procedure: LUMBAR LAMINECTOMY/DECOMPRESSION MICRODISCECTOMY RIGHT LUMBAR FIVE SACRAL ONE;  Surgeon: Jovita Gamma, MD;  Location: [facility];  Service: Neurosurgery;  Laterality: Right;  . POLYPECTOMY  04/02/2019   Procedure: POLYPECTOMY;  Surgeon: Wilford Corner, MD;  Location: [facility];  Service: Endoscopy;;  . TEE WITHOUT CARDIOVERSION N/A 09/22/2020   Procedure: TRANSESOPHAGEAL ECHOCARDIOGRAM (TEE);  Surgeon: Wonda Olds, MD;  Location: [facility];  Service: Open Heart Surgery;  Laterality: N/A;    Current Medications: Current Meds  Medication Sig  . acetaminophen (TYLENOL) 500 MG tablet Take 2 tablets (1,000 mg total) by mouth every 6 (six) hours as needed.  Marland Kitchen aspirin EC 325 MG EC tablet Take  1 tablet (325 mg total) by mouth daily.  . carvedilol (COREG) 3.125 MG tablet Take 1 tablet (3.125 mg total) by mouth 2 (two) times daily.  . Coenzyme Q10 (CO Q 10) 100 MG CAPS Take 100 mg by mouth daily.   Marland Kitchen docusate sodium (COLACE) 100 MG capsule Take 1 capsule (100 mg total) by mouth 2 (two) times daily.  Marland Kitchen LEVEMIR FLEXTOUCH 100 UNIT/ML FlexPen Inject 30 Units into the skin daily.   Marland Kitchen losartan (COZAAR) 25 MG tablet Take 25 mg by mouth daily.  . Magnesium 250 MG TABS Take 250 mg by mouth daily.  .  milk thistle 175 MG tablet Take 175 mg by mouth daily.  . Multiple Vitamins-Minerals (ALIVE ENERGY 50+) TABS Take 1 tablet by mouth daily.  . nitroGLYCERIN (NITROSTAT) 0.4 MG SL tablet Place 1 tablet (0.4 mg total) under the tongue every 5 (five) minutes as needed for chest pain (up to 3 doses).  . Omega-3 Fatty Acids (FISH OIL) 1000 MG CAPS Take 1,000 mg by mouth daily.  Marland Kitchen oxyCODONE-acetaminophen (PERCOCET) 5-325 MG tablet Take 1 tablet by mouth every 4 (four) hours as needed for severe pain.  . rosuvastatin (CRESTOR) 5 MG tablet Take 5 mg by mouth daily.  . sitaGLIPtin-metformin (JANUMET) 50-1000 MG per tablet Take 1 tablet by mouth 2 (two) times daily with a meal.    Allergies:   Other   Social History   Socioeconomic History  . Marital status: Widowed    Spouse name: Not on file  . Number of children: Not on file  . Years of education: Not on file  . Highest education level: Not on file  Occupational History  . Not on file  Tobacco Use  . Smoking status: Never Smoker  . Smokeless tobacco: Never Used  Vaping Use  . Vaping Use: Never used  Substance and Sexual Activity  . Alcohol use: Not Currently  . Drug use: Never  . Sexual activity: Not on file  Other Topics Concern  . Not on file  Social History Narrative  . Not on file   Social Determinants of Health   Financial Resource Strain: Not on file  Food Insecurity: Not on file  Transportation Needs: Not on file  Physical Activity: Not on file  Stress: Not on file  Social Connections: Not on file     Family History:  The patient's family history includes CAD in his brother; Cholecystitis in his mother; Lung disease in his father.   ROS:   Please see the history of present illness.    ROS All other systems reviewed and are negative.  No flowsheet data found.  PHYSICAL EXAM:   VS:  BP 136/78   Pulse 100   Ht 5\' 6"  (1.676 m)   Wt 234 lb 12.8 oz (106.5 kg)   SpO2 97%   BMI 37.90 kg/m    GEN: Well nourished,  well developed in no acute distress HEENT: Normal NECK: No JVD; No carotid bruits LYMPHATICS: No lymphadenopathy CARDIAC:RRR, no murmurs, rubs, gallops RESPIRATORY:  Clear to auscultation without rales, wheezing or rhonchi  ABDOMEN: Soft, non-tender, non-distended MUSCULOSKELETAL:  trivial edema; No deformity  SKIN: Warm and dry NEUROLOGIC:  Alert and oriented x 3 PSYCHIATRIC:  Normal affect    Wt Readings from Last 3 Encounters:  10/05/20 234 lb 12.8 oz (106.5 kg)  10/01/20 231 lb (104.8 kg)  09/26/20 229 lb 0.9 oz (103.9 kg)      Studies/Labs Reviewed:   EKG:  EKG  is not ordered today.    Recent Labs: 09/22/2020: ALT 16 09/23/2020: Hemoglobin 13.1; Magnesium 2.3; Platelets 175 09/25/2020: BUN 17; Creatinine, Ser 1.59; Potassium 3.9; Sodium 133   Lipid Panel    Component Value Date/Time   CHOL 77 (L) 07/15/2020 0716   TRIG 82 07/15/2020 0716   HDL 47 07/15/2020 0716   CHOLHDL 1.6 07/15/2020 0716   LDLCALC 13 07/15/2020 0716     Additional studies/ records that were reviewed today include:  OV notes from PCP, EKG    ASSESSMENT:    1. Coronary artery disease due to lipid rich plaque   2. Primary hypertension   3. DM type 2, goal HbA1c < 7% (HCC)   4. Pure hypercholesterolemia      PLAN:  In order of problems listed above:  ASCAD -Cath with 3v ASCAD -s/p CABG x 3 utilizing LIMA to LAD, SVG to Diagonal, and SVG to OM 09/22/2020. -he has not had any further chest pressure or exertional anginal -continue ASA 325mg  daily for 3 months then decrease to 81mg  daily, statin, Imdur 30mg  daily and BB -he is doing well and is stable from a heart standpoint and I will set [place] up with Cardiac Rehab  2.  HTN -BP controlled -continue Losartan 25mg  daily and carvedilol 3.125mg  BID  3.  DM2 with CKD stage 3a -followed by PCP -continue Janumet, ARB and statin -SCr 1.59 on 09/25/2020 (seems to fluctuate between 1.4 and 1.6) -he has been nauseated and his Lasix, K+,  colchicine and Imdur were stopped but no improvement -check CMET, amylase and lipase  4.  HLD -LDL goal < 70 -continue Crestor 5mg  daily -LDL was 13 in Dec 2021  Followup with me in 6 months  Medication Adjustments/Labs and Tests Ordered: Current medicines are reviewed at length with the patient today.  Concerns regarding medicines are outlined above.  Medication changes, Labs and Tests ordered today are listed in the Patient Instructions below.  There are no Patient Instructions on file for this visit.   Signed, Juan Him, MD  10/05/2020 2:08 PM    [facility] [address], [place], [place]  [address] Phone: [phone]; Fax: [phone]

## 2020-10-05 NOTE — Addendum Note (Signed)
Addended by: Devra Dopp E on: 10/05/2020 02:21 PM   Modules accepted: Orders

## 2020-10-05 NOTE — Patient Instructions (Signed)
Medication Instructions:  NO CHANGES *If you need a refill on your cardiac medications before your next appointment, please call your pharmacy*   Lab Work: TODAY CMET AMYLASE AND LIPASE If you have labs (blood work) drawn today and your tests are completely normal, you will receive your results only by: Marland Kitchen MyChart Message (if you have MyChart) OR . A paper copy in the mail If you have any lab test that is abnormal or we need to change your treatment, we will call you to review the results.   Testing/Procedures: NONE   Follow-Up: At [facility], you and your health needs are our priority.  As part of our continuing mission to provide you with exceptional heart care, we have created designated Provider Care Teams.  These Care Teams include your primary Cardiologist (physician) and Advanced Practice Providers (APPs -  Physician Assistants and Nurse Practitioners) who all work together to provide you with the care you need, when you need it.  We recommend signing up for the patient portal called "MyChart".  Sign up information is provided on this After Visit Summary.  MyChart is used to connect with patients for Virtual Visits (Telemedicine).  Patients are able to view lab/test results, encounter notes, upcoming appointments, etc.  Non-urgent messages can be sent to your provider as well.   To learn more about what you can do with MyChart, go to [email or web address].    Your next appointment:   6 month(s)  The format for your next appointment:   In Person  Provider:   Fransico Him, MD   Other Instructions NONE

## 2020-10-06 LAB — COMPREHENSIVE METABOLIC PANEL
ALT: 21 IU/L (ref 0–44)
AST: 17 IU/L (ref 0–40)
Albumin/Globulin Ratio: 1.4 (ref 1.2–2.2)
Albumin: 3.9 g/dL (ref 3.7–4.7)
Alkaline Phosphatase: 130 IU/L — ABNORMAL HIGH (ref 44–121)
BUN/Creatinine Ratio: 11 (ref 10–24)
BUN: 16 mg/dL (ref 8–27)
Bilirubin Total: 0.4 mg/dL (ref 0.0–1.2)
CO2: 28 mmol/L (ref 20–29)
Calcium: 9.8 mg/dL (ref 8.6–10.2)
Chloride: 104 mmol/L (ref 96–106)
Creatinine, Ser: 1.46 mg/dL — ABNORMAL HIGH (ref 0.76–1.27)
GFR calc Af Amer: 55 mL/min/{1.73_m2} — ABNORMAL LOW (ref >59)
GFR calc non Af Amer: 47 mL/min/{1.73_m2} — ABNORMAL LOW (ref >59)
Globulin, Total: 2.7 g/dL (ref 1.5–4.5)
Glucose: 239 mg/dL — ABNORMAL HIGH (ref 65–99)
Potassium: 5.1 mmol/L (ref 3.5–5.2)
Sodium: 144 mmol/L (ref 134–144)
Total Protein: 6.6 g/dL (ref 6.0–8.5)

## 2020-10-06 LAB — LIPASE: Lipase: 116 U/L — ABNORMAL HIGH (ref 13–78)

## 2020-10-06 LAB — AMYLASE: Amylase: 120 U/L — ABNORMAL HIGH (ref 31–110)

## 2020-10-12 ENCOUNTER — Encounter: Payer: Self-pay | Admitting: Cardiothoracic Surgery

## 2020-10-12 ENCOUNTER — Other Ambulatory Visit: Payer: Self-pay

## 2020-10-12 ENCOUNTER — Ambulatory Visit (INDEPENDENT_AMBULATORY_CARE_PROVIDER_SITE_OTHER): Payer: Self-pay | Admitting: Cardiothoracic Surgery

## 2020-10-12 VITALS — BP 140/82 | HR 102 | Temp 99.0°F | Resp 20 | Ht 66.0 in | Wt 242.2 lb

## 2020-10-12 DIAGNOSIS — Z951 Presence of aortocoronary bypass graft: Secondary | ICD-10-CM

## 2020-10-12 MED ORDER — OXYCODONE-ACETAMINOPHEN 5-325 MG PO TABS: 1.0000 | ORAL_TABLET | Freq: Four times a day (QID) | ORAL | 0 refills | Status: DC | PRN

## 2020-10-15 NOTE — Progress Notes (Signed)
[address]       [place] [address]             [phone]     CARDIOTHORACIC SURGERY OFFICE NOTE  Referring Provider is Sueanne Margarita, MD Primary Cardiologist is Fransico Him, MD PCP is Wynelle Fanny, DO   HPI:  75 year old man presents for his second outpatient visit status post CABG x 3 on 09/22/2020.  He did well after surgery and has been seen in the office once before.  He has had some difficulty with persistent pain across the chest,  but he has been active.  Denies angina or shortness of breath.   Current Outpatient Medications  Medication Sig Dispense Refill  . acetaminophen (TYLENOL) 500 MG tablet Take 2 tablets (1,000 mg total) by mouth every 6 (six) hours as needed. 30 tablet 0  . aspirin EC 325 MG EC tablet Take 1 tablet (325 mg total) by mouth daily. 30 tablet 0  . carvedilol (COREG) 3.125 MG tablet Take 1 tablet (3.125 mg total) by mouth 2 (two) times daily. 60 tablet 1  . Coenzyme Q10 (CO Q 10) 100 MG CAPS Take 100 mg by mouth daily.     Marland Kitchen docusate sodium (COLACE) 100 MG capsule Take 1 capsule (100 mg total) by mouth 2 (two) times daily. 60 capsule 2  . LEVEMIR FLEXTOUCH 100 UNIT/ML FlexPen Inject 30 Units into the skin daily.     Marland Kitchen losartan (COZAAR) 25 MG tablet Take 25 mg by mouth daily.    . Magnesium 250 MG TABS Take 250 mg by mouth daily.    . milk thistle 175 MG tablet Take 175 mg by mouth daily.    . Multiple Vitamins-Minerals (ALIVE ENERGY 50+) TABS Take 1 tablet by mouth daily.    . nitroGLYCERIN (NITROSTAT) 0.4 MG SL tablet Place 1 tablet (0.4 mg total) under the tongue every 5 (five) minutes as needed for chest pain (up to 3 doses). 25 tablet 0  . Omega-3 Fatty Acids (FISH OIL) 1000 MG CAPS Take 1,000 mg by mouth daily.    Marland Kitchen oxyCODONE-acetaminophen (PERCOCET) 5-325 MG tablet Take 1 tablet by mouth every 4 (four) hours as needed for severe pain. 20 tablet 0  . oxyCODONE-acetaminophen (PERCOCET) 5-325 MG tablet Take 1 tablet by mouth  every 6 (six) hours as needed for severe pain. 30 tablet 0  . rosuvastatin (CRESTOR) 5 MG tablet Take 5 mg by mouth daily.    . sitaGLIPtin-metformin (JANUMET) 50-1000 MG per tablet Take 1 tablet by mouth 2 (two) times daily with a meal.     No current facility-administered medications for this visit.   Facility-Administered Medications Ordered in Other Visits  Medication Dose Route Frequency Provider Last Rate Last Admin  . fentaNYL citrate (PF) (SUBLIMAZE) injection   Intravenous Anesthesia Intra-op Everlean Cherry A, CRNA   50 mcg at 09/16/20 0756  . lactated ringers infusion   Intravenous Continuous PRN Colin Benton, CRNA   New Bag at 09/16/20 [phone]  . midazolam (VERSED) 5 MG/5ML injection   Intravenous Anesthesia Intra-op Everlean Cherry A, CRNA   1 mg at 09/16/20 7824      Physical Exam:   BP 140/82 (BP Location: Left Arm, Patient Position: Sitting, Cuff Size: Large)   Pulse (!) 102   Temp 99 F (37.2 C) (Skin)   Resp 20   Ht 5\' 6"  (1.676 m)   Wt 109.9 kg   SpO2 95% Comment: RA  BMI  39.09 kg/m   General:  Well-appearing man in no acute distress  Chest:   Clear to auscultation bilaterally  CV:   Regular rate and rhythm  Incisions:  Well-healed  Abdomen:  Soft nontender  Extremities:  Minimal edema; warm and well perfused  Diagnostic Tests:  No new images   Impression:  Doing well after CABG  Plan:  Prescription for oxycodone refilled and called in Follow-up as needed with thoracic surgery  I spent in excess of 20 minutes during the conduct of this office consultation and >50% of this time involved direct face-to-face encounter with the patient for counseling and/or coordination of their care.  Level 2                 10 minutes Level 3                 15 minutes Level 4                 25 minutes Level 5                 40 minutes  B.  Murvin Natal, MD 10/15/2020 10:17 AM

## 2020-10-16 ENCOUNTER — Telehealth: Payer: Self-pay | Admitting: *Deleted

## 2020-10-16 NOTE — Telephone Encounter (Signed)
Patient's daughter, Andee Poles, contacted the office stating patient's wound was draining green drainage at the lower portion of the incision. Danielle sent in a photo of the incision. Incision well approximated without redness to the incision or surrounding area. Scab noted on some aspects of the incision with some of the scabbing with a yellow/green appearance in color. Patient denies any active drainage at this time and no drainage noticed in photo. Patient denies fever. Photo shown to Dr. Orvan Seen and no intervention needed at this time. Danielle made aware. Advised to call back if drainage occurs/increases, the incision becomes red, or if Mr. Siefert develops a fever. No further questions at this time.

## 2020-10-20 ENCOUNTER — Ambulatory Visit
Admission: RE | Admit: 2020-10-20 | Discharge: 2020-10-20 | Disposition: A | Discharge status: Home / Self Care | Payer: Medicare Other | Source: Ambulatory Visit | Attending: Internal Medicine | Admitting: Internal Medicine

## 2020-10-20 ENCOUNTER — Other Ambulatory Visit: Payer: Self-pay | Admitting: Internal Medicine

## 2020-10-20 DIAGNOSIS — I2510 Atherosclerotic heart disease of native coronary artery without angina pectoris: Secondary | ICD-10-CM

## 2020-10-20 DIAGNOSIS — R1084 Generalized abdominal pain: Secondary | ICD-10-CM

## 2020-10-29 ENCOUNTER — Encounter (HOSPITAL_COMMUNITY): Payer: Self-pay

## 2020-10-29 ENCOUNTER — Telehealth (HOSPITAL_COMMUNITY): Payer: Self-pay

## 2020-10-29 NOTE — Telephone Encounter (Signed)
Attempted to call patient in regards to Cardiac Rehab - LM on VM Mailed letter 

## 2020-11-11 ENCOUNTER — Telehealth (HOSPITAL_COMMUNITY): Payer: Self-pay

## 2020-11-11 NOTE — Telephone Encounter (Signed)
Pt called and stated he has a follow up appt in 2 weeks and then he will be ready to schedule for CR.

## 2020-12-02 ENCOUNTER — Encounter: Payer: Self-pay | Admitting: Gastroenterology

## 2020-12-17 ENCOUNTER — Telehealth (HOSPITAL_COMMUNITY): Payer: Self-pay

## 2020-12-17 NOTE — Telephone Encounter (Signed)
Called and spoke with pt daughter Andee Poles to see if pt would like to go to cardiac rehab, gave pt daughter information about the cardiac rehab, pt daughter stated that she will talk her father about the program to see if he wants to participate. Pt's daughter stated that she will give [place] a call back. Placed pt ppw in the back of the pile.

## 2020-12-21 ENCOUNTER — Encounter (HOSPITAL_COMMUNITY): Payer: Self-pay

## 2021-01-06 ENCOUNTER — Other Ambulatory Visit: Payer: Self-pay

## 2021-01-06 ENCOUNTER — Encounter: Payer: Self-pay | Admitting: Gastroenterology

## 2021-01-06 ENCOUNTER — Ambulatory Visit: Payer: Medicare Other | Admitting: Gastroenterology

## 2021-01-06 VITALS — BP 130/80 | HR 85 | Ht 66.0 in | Wt 228.8 lb

## 2021-01-06 DIAGNOSIS — D649 Anemia, unspecified: Secondary | ICD-10-CM

## 2021-01-06 NOTE — Progress Notes (Signed)
HPI: This is a very pleasant 75 year old man who was referred to me by Wynelle Fanny, DO  to evaluate anemia.    I see an office note from early April 2022 which she states he has had "persistent anemia with a hemoglobin in the 10 range."  We have reached out to the office but have not been able to get any actual lab test results.  Last CBC available was while he was hospitalized at [facility] following his CABG 3 months ago, his hemoglobin was persistently normal while in the hospital.  Last CBC 09/2020 hemoglobin 13.1.  Normocytic.  Normal platelets.  He is a long-term patient of [facility].  He sees Dr. Wilford Corner.  He underwent a colonoscopy August 2020 with Dr. Michail Sermon for personal history of polyps " last colonoscopy 2016".  Dr. Michail Sermon found a 7 mm polyp, hemorrhoids.  The polyp was a tubular adenoma without high-grade dysplasia.  I have no other records from earlier colonoscopies at the time of this visit.  Today he tells me that he was started on iron about a month ago.  He never sees blood in his stool.  He has generally regular bowel movements on a daily basis.  His weight is overall stable.  Colon cancer does not run in his family.  He also today described a positional upper abdominal pain that started immediately after his CABG 3 months ago.  This will be a discomfort in the left upper quadrant that would reliably happen whenever he sat down and then would reliably improve when he stood back up.  In the past month or so those discomforts have completely resolved.   Review of systems: Pertinent positive and negative review of systems were noted in the above HPI section. All other review negative.   Past Medical History:  Diagnosis Date  . 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 Diabetes mellitus ([place])   . Hypertension   . Patient is Jehovah's Witness   . Renal insufficiency   . Sleep apnea    CPAP nightly    Past Surgical History:   Procedure Laterality Date  . BACK SURGERY    . CARDIAC CATHETERIZATION    . COLONOSCOPY    . COLONOSCOPY WITH PROPOFOL N/A 04/02/2019   Procedure: COLONOSCOPY WITH PROPOFOL;  Surgeon: Wilford Corner, MD;  Location: [facility];  Service: Endoscopy;  Laterality: N/A;  . CORONARY ARTERY BYPASS GRAFT N/A 09/22/2020   Procedure: CORONARY ARTERY BYPASS GRAFTING (CABG), ON PUMP, TIMES THREE, USING LEFT INTERNAL MAMMARY ARTERY AND ENDOSCOPICALLY HARVESTED RIGHT GREATER SAPHENOUS VEIN;  Surgeon: Wonda Olds, MD;  Location: [facility];  Service: Open Heart Surgery;  Laterality: N/A;  . ENDOVEIN HARVEST OF GREATER SAPHENOUS VEIN Right 09/22/2020   Procedure: ENDOVEIN HARVEST OF GREATER SAPHENOUS VEIN;  Surgeon: Wonda Olds, MD;  Location: [place];  Service: Open Heart Surgery;  Laterality: Right;  . LEFT HEART CATH AND CORONARY ANGIOGRAPHY N/A 07/20/2020   Procedure: LEFT HEART CATH AND CORONARY ANGIOGRAPHY;  Surgeon: Lorretta Harp, MD;  Location: [place] [facility];  Service: Cardiovascular;  Laterality: N/A;  . LUMBAR LAMINECTOMY/DECOMPRESSION MICRODISCECTOMY Right 01/01/2015   Procedure: LUMBAR LAMINECTOMY/DECOMPRESSION MICRODISCECTOMY RIGHT LUMBAR FIVE SACRAL ONE;  Surgeon: Jovita Gamma, MD;  Location: [place] [facility];  Service: Neurosurgery;  Laterality: Right;  . POLYPECTOMY  04/02/2019   Procedure: POLYPECTOMY;  Surgeon: Wilford Corner, MD;  [facility];  Service: Endoscopy;;  . TEE WITHOUT CARDIOVERSION N/A 09/22/2020  Procedure: TRANSESOPHAGEAL ECHOCARDIOGRAM (TEE);  Surgeon: Wonda Olds, MD;  Location: [place];  Service: Open Heart Surgery;  Laterality: N/A;    Current Outpatient Medications  Medication Sig Dispense Refill  . acetaminophen (TYLENOL) 500 MG tablet Take 2 tablets (1,000 mg total) by mouth every 6 (six) hours as needed. 30 tablet 0  . aspirin EC 325 MG EC tablet Take 1 tablet (325 mg total) by mouth daily. 30 tablet 0  . Cholecalciferol (VITAMIN D-3) 125  MCG (5000 UT) TABS Take 1 tablet by mouth daily at 2 PM.    . Coenzyme Q10 (CO Q 10) 100 MG CAPS Take 100 mg by mouth daily.     . FEROSUL 325 (65 Fe) MG tablet Take 325 mg by mouth daily.    . furosemide (LASIX) 20 MG tablet Take 20 mg by mouth daily.    . Grape Seed Extract 50 MG CAPS Take 2 capsules by mouth daily.    . isosorbide mononitrate (IMDUR) 30 MG 24 hr tablet Take 30 mg by mouth daily.    Marland Kitchen LEVEMIR FLEXTOUCH 100 UNIT/ML FlexPen Inject 40 Units into the skin daily.    Marland Kitchen losartan (COZAAR) 25 MG tablet Take 25 mg by mouth daily.    . Magnesium 250 MG TABS Take 250 mg by mouth daily.    . milk thistle 175 MG tablet Take 175 mg by mouth daily.    . Multiple Vitamins-Minerals (ALIVE ENERGY 50+) TABS Take 1 tablet by mouth daily.    . nitroGLYCERIN (NITROSTAT) 0.4 MG SL tablet PLACE 1 TABLET (0.4 MG TOTAL) UNDER THE TONGUE EVERY FIVE MINUTES AS NEEDED FOR CHEST PAIN (UP TO 3 DOSES). 25 tablet 0  . oxyCODONE-acetaminophen (PERCOCET/ROXICET) 5-325 MG tablet Take 1 tablet by mouth 2 (two) times daily as needed for severe pain.    . pantoprazole (PROTONIX) 20 MG tablet Take 20 mg by mouth daily.    . Potassium Chloride ER 20 MEQ TBCR Take 1 tablet by mouth daily.    . rosuvastatin (CRESTOR) 5 MG tablet Take 5 mg by mouth daily.    . sitaGLIPtin-metformin (JANUMET) 50-1000 MG per tablet Take 1 tablet by mouth 2 (two) times daily with a meal.    . vitamin C (ASCORBIC ACID) 500 MG tablet Take 2,000 mg by mouth daily.    . carvedilol (COREG) 3.125 MG tablet Take 1 tablet (3.125 mg total) by mouth 2 (two) times daily. 60 tablet 1   No current facility-administered medications for this visit.   Facility-Administered Medications Ordered in Other Visits  Medication Dose Route Frequency Provider Last Rate Last Admin  . fentaNYL citrate (PF) (SUBLIMAZE) injection   Intravenous Anesthesia Intra-op Everlean Cherry A, CRNA   50 mcg at 09/16/20 0756  . lactated ringers infusion   Intravenous Continuous  PRN Colin Benton, CRNA   New Bag at 09/16/20 [phone]  . midazolam (VERSED) 5 MG/5ML injection   Intravenous Anesthesia Intra-op Everlean Cherry A, CRNA   1 mg at 09/16/20 7342    Allergies as of 01/06/2021 - Review Complete 01/06/2021  Allergen Reaction Noted  . Other  01/01/2015    Family History  Problem Relation Age of Onset  . Cholecystitis Mother   . Lung disease Father   . CAD Brother     Social History   Socioeconomic History  . Marital status: Widowed    Spouse name: Not on file  . Number of children: Not on file  . Years of education: Not on  file  . Highest education level: Not on file  Occupational History  . Not on file  Tobacco Use  . Smoking status: Never Smoker  . Smokeless tobacco: Never Used  Vaping Use  . Vaping Use: Never used  Substance and Sexual Activity  . Alcohol use: Not Currently  . Drug use: Never  . Sexual activity: Not on file  Other Topics Concern  . Not on file  Social History Narrative  . Not on file   Social Determinants of Health   Financial Resource Strain: Not on file  Food Insecurity: Not on file  Transportation Needs: Not on file  Physical Activity: Not on file  Stress: Not on file  Social Connections: Not on file  Intimate Partner Violence: Not on file     Physical Exam: BP 130/80   Pulse 85   Ht 5\' 6"  (1.676 m)   Wt 228 lb 12.8 oz (103.8 kg)   BMI 36.93 kg/m  Constitutional: generally well-appearing Psychiatric: alert and oriented x3 Eyes: extraocular movements intact Mouth: oral pharynx moist, no lesions Neck: supple no lymphadenopathy Cardiovascular: heart regular rate and rhythm Lungs: clear to auscultation bilaterally Abdomen: soft, nontender, nondistended, no obvious ascites, no peritoneal signs, normal bowel sounds Extremities: no lower extremity edema bilaterally Skin: no lesions on visible extremities   Assessment and plan: 75 y.o. male with possible anemia, personal history of adenomatous colon  polyps  He is a long-term patient of [facility], Dr. Michail Sermon.  He had a colonoscopy about a year and a half ago.  See those results summarized above.  I am not really sure that he is anemic.  Indeed we have multiple blood draws from him while he was hospitalized for his bypass surgery in February 2022 and he was never anemic.  He was discharged in early February and his hemoglobin was 13.1.  We will again try to obtain records from his referring physicians office to see if we can fill in some of the gaps in our knowledge.  He certainly might have had anemia after his bypass surgery.  He does not want to have another blood draw here today.   Please see the "Patient Instructions" section for addition details about the plan.   Owens Loffler, MD [facility] 01/06/2021, 10:01 AM  Cc: Wynelle Fanny, DO  Total time on date of encounter was 45  minutes (this included time spent preparing to see the patient reviewing records; obtaining and/or reviewing separately obtained history; performing a medically appropriate exam and/or evaluation; counseling and educating the patient and family if present; ordering medications, tests or procedures if applicable; and documenting clinical information in the health record).

## 2021-01-06 NOTE — Patient Instructions (Signed)
If you are age 73 or older, your body mass index should be between 23-30. Your Body mass index is 36.93 kg/m. If this is out of the aforementioned range listed, please consider follow up with your Primary Care Provider.  The [facility] providers would like to encourage you to use [facility] to communicate with providers for non-urgent requests or questions.  Due to long hold times on the telephone, sending your provider a message by [facility] may be a faster and more efficient way to get a response.  Please allow 48 business hours for a response.  Please remember that this is for non-urgent requests.   We will attempt to obtain your previous lab work completed at [facility].  Thank you for entrusting me with your care and choosing [facility].  Dr Ardis Hughs

## 2021-02-08 ENCOUNTER — Telehealth: Payer: Self-pay | Admitting: Gastroenterology

## 2021-02-08 NOTE — Telephone Encounter (Signed)
Please call him.  I reviewed a packet of information that was sent, I believe from his primary care team.  This included blood work that was drawn Jan 04, 2021, CBC was normal.   Please let him know that he is indeed not anemic and he does not need any further GI evaluation.

## 2021-02-08 NOTE — Telephone Encounter (Signed)
The patient has been notified of this information and all questions answered.

## 2021-07-15 ENCOUNTER — Encounter (HOSPITAL_COMMUNITY): Payer: Self-pay

## 2021-07-15 ENCOUNTER — Emergency Department (HOSPITAL_COMMUNITY): Payer: Medicare Other

## 2021-07-15 ENCOUNTER — Ambulatory Visit (HOSPITAL_COMMUNITY)
Admission: EM | Admit: 2021-07-15 | Discharge: 2021-07-15 | Disposition: A | Discharge status: Home / Self Care | Payer: Medicare Other | Attending: Internal Medicine | Admitting: Internal Medicine

## 2021-07-15 ENCOUNTER — Emergency Department (HOSPITAL_COMMUNITY)
Admission: EM | Admit: 2021-07-15 | Discharge: 2021-07-15 | Disposition: A | Discharge status: Home / Self Care | Payer: Medicare Other | Attending: Emergency Medicine | Admitting: Emergency Medicine

## 2021-07-15 ENCOUNTER — Encounter (HOSPITAL_COMMUNITY): Payer: Self-pay | Admitting: Emergency Medicine

## 2021-07-15 ENCOUNTER — Telehealth: Payer: Self-pay

## 2021-07-15 ENCOUNTER — Other Ambulatory Visit: Payer: Self-pay

## 2021-07-15 DIAGNOSIS — R0602 Shortness of breath: Secondary | ICD-10-CM | POA: Diagnosis present

## 2021-07-15 DIAGNOSIS — E119 Type 2 diabetes mellitus without complications: Secondary | ICD-10-CM | POA: Insufficient documentation

## 2021-07-15 DIAGNOSIS — I10 Essential (primary) hypertension: Secondary | ICD-10-CM | POA: Diagnosis not present

## 2021-07-15 DIAGNOSIS — Z7984 Long term (current) use of oral hypoglycemic drugs: Secondary | ICD-10-CM | POA: Diagnosis not present

## 2021-07-15 DIAGNOSIS — I2510 Atherosclerotic heart disease of native coronary artery without angina pectoris: Secondary | ICD-10-CM | POA: Diagnosis not present

## 2021-07-15 DIAGNOSIS — R079 Chest pain, unspecified: Secondary | ICD-10-CM

## 2021-07-15 DIAGNOSIS — Z7982 Long term (current) use of aspirin: Secondary | ICD-10-CM | POA: Diagnosis not present

## 2021-07-15 DIAGNOSIS — U071 COVID-19: Secondary | ICD-10-CM

## 2021-07-15 DIAGNOSIS — Z951 Presence of aortocoronary bypass graft: Secondary | ICD-10-CM | POA: Diagnosis not present

## 2021-07-15 DIAGNOSIS — Z79899 Other long term (current) drug therapy: Secondary | ICD-10-CM | POA: Diagnosis not present

## 2021-07-15 LAB — RESP PANEL BY RT-PCR (FLU A&B, COVID) ARPGX2
Influenza A by PCR: NEGATIVE
Influenza B by PCR: NEGATIVE
SARS Coronavirus 2 by RT PCR: POSITIVE — AB

## 2021-07-15 LAB — BASIC METABOLIC PANEL
Anion gap: 7 (ref 5–15)
BUN: 21 mg/dL (ref 8–23)
CO2: 30 mmol/L (ref 22–32)
Calcium: 8.9 mg/dL (ref 8.9–10.3)
Chloride: 100 mmol/L (ref 98–111)
Creatinine, Ser: 1.75 mg/dL — ABNORMAL HIGH (ref 0.61–1.24)
GFR, Estimated: 41 mL/min — ABNORMAL LOW (ref >60)
Glucose, Bld: 155 mg/dL — ABNORMAL HIGH (ref 70–99)
Potassium: 4.7 mmol/L (ref 3.5–5.1)
Sodium: 137 mmol/L (ref 135–145)

## 2021-07-15 LAB — CBC
HCT: 44.3 % (ref 39.0–52.0)
Hemoglobin: 13.4 g/dL (ref 13.0–17.0)
MCH: 28.9 pg (ref 26.0–34.0)
MCHC: 30.2 g/dL (ref 30.0–36.0)
MCV: 95.5 fL (ref 80.0–100.0)
Platelets: 185 10*3/uL (ref 150–400)
RBC: 4.64 MIL/uL (ref 4.22–5.81)
RDW: 12.1 % (ref 11.5–15.5)
WBC: 6.1 10*3/uL (ref 4.0–10.5)
nRBC: 0 % (ref 0.0–0.2)

## 2021-07-15 LAB — BRAIN NATRIURETIC PEPTIDE: B Natriuretic Peptide: 13.9 pg/mL (ref 0.0–100.0)

## 2021-07-15 LAB — TROPONIN I (HIGH SENSITIVITY)
Troponin I (High Sensitivity): 7 ng/L (ref <18)
Troponin I (High Sensitivity): 8 ng/L (ref <18)

## 2021-07-15 NOTE — ED Triage Notes (Signed)
Pt c/o intermittent cp and sob x 4 days.

## 2021-07-15 NOTE — Discharge Instructions (Addendum)
You were seen in the emergency department for evaluation of continued cough chest pain and shortness of breath.  There is no evidence of any heart injury in your lab work or EKG.  You did test positive for COVID.  Your chest x-ray did not show any evidence of pneumonia.  Please continue to stay well-hydrated and follow-up with a primary care doctor.  Continue your regular medications.  Return to the emergency department if any worsening or concerning symptoms

## 2021-07-15 NOTE — Telephone Encounter (Signed)
Patient's daughter, Andee Poles contacted the office to state that she was going to take her dad to the ED as he is having "chest complications". She states that she did contact his primacy care doctor and they were unable to accommodate him at this time so she decided to take him to [facility] emergency. Advised to contact patient's Cardiologist, Dr. Radford Pax and let them be aware that he was going to the ED. She acknowledged receipt.

## 2021-07-15 NOTE — ED Provider Notes (Signed)
[facility] DEPT Provider Note   CSN: [id number] Arrival date & time: 07/15/21  1051     History Chief Complaint  Patient presents with   Chest Pain   Shortness of Breath    Juan Hatfield is a 75 y.o. male.  He has a history of coronary disease CABG diabetes hypertension.  Complaining of a week or 2 of feeling sick with a cold.  Cough productive of yellow sputum.  Chest discomfort.  Feeling chilled at times.  Mild headache and runny nose.  No sore throat.  No body aches vomiting diarrhea.  Has been trying some over-the-counter medications without improvement.  He is COVID vaccinated.  The history is provided by the patient and a relative.  Chest Pain Pain location:  Substernal area Pain quality: pressure   Pain radiates to:  Does not radiate Pain severity:  Moderate Onset quality:  Gradual Duration:  2 weeks Timing:  Intermittent Progression:  Unchanged Chronicity:  New Relieved by:  Nothing Worsened by:  Coughing and exertion Ineffective treatments: OTC meds. 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 CT scan    Diabetes mellitus ([place])    Hypertension    Patient is Jehovah's Witness    Renal insufficiency    Sleep apnea    CPAP nightly    Patient Active Problem List   Diagnosis Date Noted   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   Chest pain of uncertain etiology    Personal history of colonic polyps 04/02/2019   HNP (herniated nucleus pulposus), lumbar 01/01/2015    Past Surgical History:  Procedure Laterality Date   BACK SURGERY      CARDIAC CATHETERIZATION     COLONOSCOPY     COLONOSCOPY WITH PROPOFOL N/A 04/02/2019   Procedure: COLONOSCOPY WITH PROPOFOL;  Surgeon: Wilford Corner, MD;  [facility];  Service: Endoscopy;  Laterality: N/A;   CORONARY ARTERY BYPASS GRAFT N/A 09/22/2020   Procedure: CORONARY ARTERY BYPASS GRAFTING (CABG), ON PUMP, TIMES THREE, USING LEFT INTERNAL MAMMARY ARTERY AND ENDOSCOPICALLY HARVESTED RIGHT GREATER SAPHENOUS VEIN;  Surgeon: Wonda Olds, MD;  Location: [facility];  Service: Open Heart Surgery;  Laterality: N/A;   ENDOVEIN HARVEST OF GREATER SAPHENOUS VEIN Right 09/22/2020   Procedure: ENDOVEIN HARVEST OF GREATER SAPHENOUS VEIN;  Surgeon: Wonda Olds, MD;  Location: [place];  Service: Open Heart Surgery;  Laterality: Right;   LEFT HEART CATH AND CORONARY ANGIOGRAPHY N/A 07/20/2020   Procedure: LEFT HEART CATH AND CORONARY ANGIOGRAPHY;  Surgeon: Lorretta Harp, MD;  Location: [place] CV LAB;  Service: Cardiovascular;  Laterality: N/A;   LUMBAR LAMINECTOMY/DECOMPRESSION MICRODISCECTOMY Right 01/01/2015   Procedure: LUMBAR LAMINECTOMY/DECOMPRESSION MICRODISCECTOMY RIGHT LUMBAR FIVE SACRAL ONE;  Surgeon: Jovita Gamma, MD;  Location: [place] [facility];  Service: Neurosurgery;  Laterality: Right;   POLYPECTOMY  04/02/2019   Procedure: POLYPECTOMY;  Surgeon: Wilford Corner, MD;  [facility];  Service: Endoscopy;;   TEE WITHOUT CARDIOVERSION N/A 09/22/2020   Procedure: TRANSESOPHAGEAL ECHOCARDIOGRAM (TEE);  Surgeon: Wonda Olds, MD;  Location: [facility];  Service: Open Heart Surgery;  Laterality: N/A;       Family History  Problem Relation Age of Onset   Cholecystitis Mother    Lung disease Father    CAD Brother     Social History   Tobacco Use   Smoking status: Never   Smokeless tobacco: Never  Vaping Use   Vaping Use: Never used  Substance Use Topics   Alcohol use: Not Currently   Drug use: Never    Home Medications Prior to Admission medications    Medication Sig Start Date End Date Taking? Authorizing Provider  acetaminophen (TYLENOL) 500 MG tablet Take 2 tablets (1,000 mg total) by mouth every 6 (six) hours as needed. 09/25/20   Barrett, Lodema Hong, PA-C  aspirin EC 325 MG EC tablet Take 1 tablet (325 mg total) by mouth daily. 09/26/20   Barrett, Erin R, PA-C  carvedilol (COREG) 3.125 MG tablet Take 1 tablet (3.125 mg total) by mouth 2 (two) times daily. 09/28/20 12/27/20  Wonda Olds, MD  Cholecalciferol (VITAMIN D-3) 125 MCG (5000 UT) TABS Take 1 tablet by mouth daily at 2 PM.    [provider]  Coenzyme Q10 (CO Q 10) 100 MG CAPS Take 100 mg by mouth daily.     [provider]  FEROSUL 325 (65 Fe) MG tablet Take 325 mg by mouth daily. 11/12/20   [provider]  furosemide (LASIX) 20 MG tablet Take 20 mg by mouth daily. 12/01/20   [provider]  Grape Seed Extract 50 MG CAPS Take 2 capsules by mouth daily.    [provider]  isosorbide mononitrate (IMDUR) 30 MG 24 hr tablet Take 30 mg by mouth daily.    [provider]  LEVEMIR FLEXTOUCH 100 UNIT/ML FlexPen Inject 40 Units into the skin daily. 05/09/20   [provider]  losartan (COZAAR) 25 MG tablet Take 25 mg by mouth daily.    [provider]  Magnesium 250 MG TABS Take 250 mg by mouth daily.    [provider]  milk thistle 175 MG tablet Take 175 mg by mouth daily.    [provider]  Multiple Vitamins-Minerals (ALIVE ENERGY 50+) TABS Take 1 tablet by mouth daily.    [provider]  nitroGLYCERIN (NITROSTAT) 0.4 MG SL tablet PLACE 1 TABLET (0.4 MG TOTAL) UNDER THE TONGUE EVERY FIVE MINUTES AS NEEDED FOR CHEST PAIN (UP TO 3 DOSES). 07/20/20 07/20/21  Dunn, Nedra Hai, PA-C  oxyCODONE-acetaminophen (PERCOCET/ROXICET) 5-325 MG tablet Take 1 tablet by mouth 2 (two) times daily as needed for severe pain.    [provider]  pantoprazole (PROTONIX) 20 MG tablet Take 20 mg by mouth daily.  11/12/20   [provider]  Potassium Chloride ER 20 MEQ TBCR Take 1 tablet by mouth daily. 11/09/20   [provider]  rosuvastatin (CRESTOR) 5 MG tablet Take 5 mg by mouth daily.    [provider]  sitaGLIPtin-metformin (JANUMET) 50-1000 MG per tablet Take 1 tablet by mouth 2 (two) times daily with a meal.    [provider]  vitamin C (ASCORBIC ACID) 500 MG tablet Take 2,000 mg by mouth daily.    [provider]    Allergies    Other  Review of Systems   Review of Systems  Constitutional:  Positive for fatigue. Negative for diaphoresis.  HENT:  Positive for sore throat.   Eyes:  Negative for visual disturbance.  Respiratory:  Positive for cough and shortness of breath.  Cardiovascular:  Positive for chest pain.  Gastrointestinal:  Negative for nausea and vomiting.  Genitourinary:  Negative for dysuria.  Musculoskeletal:  Negative for joint swelling.  Skin:  Negative for rash.  Neurological:  Positive for dizziness and headaches.   Physical Exam Updated Vital Signs BP (!) 154/105   Pulse 78   Temp 97.9 F (36.6 C) (Oral)   Resp 20   SpO2 97%   Physical Exam Vitals and nursing note reviewed.  Constitutional:      General: He is not in acute distress.    Appearance: He is well-developed.  HENT:     Head: Normocephalic and atraumatic.  Eyes:     Conjunctiva/sclera: Conjunctivae normal.  Cardiovascular:     Rate and Rhythm: Normal rate and regular rhythm.     Heart sounds: Normal heart sounds. No murmur heard. Pulmonary:     Effort: Pulmonary effort is normal. No respiratory distress.     Breath sounds: Normal breath sounds.  Abdominal:     Palpations: Abdomen is soft.     Tenderness: There is no abdominal tenderness.  Musculoskeletal:        General: No swelling.     Cervical back: Neck supple.     Right lower leg: No tenderness. Edema present.     Left lower leg: No tenderness. Edema present.  Skin:    General: Skin  is warm and dry.     Capillary Refill: Capillary refill takes less than 2 seconds.  Neurological:     Mental Status: He is alert.  Psychiatric:        Mood and Affect: Mood normal.    ED Results / Procedures / Treatments   Labs (all labs ordered are listed, but only abnormal results are displayed) Labs Reviewed  RESP PANEL BY RT-PCR (FLU A&B, COVID) ARPGX2 - Abnormal; Notable for the following components:      Result Value   SARS Coronavirus 2 by RT PCR POSITIVE (*)    All other components within normal limits  BASIC METABOLIC PANEL - Abnormal; Notable for the following components:   Glucose, Bld 155 (*)    Creatinine, Ser 1.75 (*)    GFR, Estimated 41 (*)    All other components within normal limits  CBC  BRAIN NATRIURETIC PEPTIDE  TROPONIN I (HIGH SENSITIVITY)  TROPONIN I (HIGH SENSITIVITY)    EKG EKG Interpretation  Date/Time:  Thursday July 15 2021 11:13:19 EST Ventricular Rate:  76 PR Interval:  211 QRS Duration: 139 QT Interval:  393 QTC Calculation: 442 R Axis:   -60 Text Interpretation: Sinus rhythm IVCD, consider atypical RBBB Inferior infarct, old Probable anterior infarct, age indeterminate Lateral leads are also involved No significant change since prior 2/22 Confirmed by Aletta Edouard [phone]) on 07/15/2021 2:59:30 PM  Radiology DG Chest 2 View  Result Date: 07/15/2021 CLINICAL DATA:  Chest pain. EXAM: CHEST - 2 VIEW COMPARISON:  October 20, 2020. FINDINGS: Stable cardiomediastinal silhouette. Sternotomy wires are noted. Old right rib fractures are noted. Minimal left basilar subsegmental atelectasis or scarring is noted. Mild pleural thickening is noted in the right lung base. IMPRESSION: Minimal left basilar subsegmental atelectasis or scarring. Electronically Signed   By: Marijo Conception M.D.   On: 07/15/2021 12:06    Procedures Procedures   Medications Ordered in ED Medications - No data to display  ED Course  I have reviewed the triage vital signs  and the nursing notes.  Pertinent labs & imaging results that were available  during my care of the patient were reviewed by me and considered in my medical decision making (see chart for details).  Clinical Course as of 07/16/21 1058  Thu Jul 15, 2021  1625 Patient tested positive for COVID.  Rest of his lab work looks reasonable other than a mild AKI.  He is eating and drinking well satting well on room air.  I think he is outside of the window for monoclonal antibodies.  Recommended symptomatic treatment at home and he is comfortable with plan.  Close follow-up with PCP.  Return instructions discussed [MB]    Clinical Course User Index [MB] Hayden Rasmussen, MD   MDM Rules/Calculators/A&P                          Juan Hatfield was evaluated in Emergency Department on 07/15/2021 for the symptoms described in the history of present illness. He was evaluated in the context of the global COVID-19 pandemic, which necessitated consideration that the patient might be at risk for infection with the SARS-CoV-2 virus that causes COVID-19. Institutional protocols and algorithms that pertain to the evaluation of patients at risk for COVID-19 are in a state of rapid change based on information released by regulatory bodies including the CDC and federal and state organizations. These policies and algorithms were followed during the patient's care in the ED. This patient complains of cough runny nose chest pain shortness of breath; this involves an extensive number of treatment Options and is a complaint that carries with it a high risk of complications and Morbidity. The differential includes COPD, CHF, pneumonia, COVID, flu  I ordered, reviewed and interpreted labs, which included CBC with normal white count normal hemoglobin, chemistries normal other than elevated creatinine stable from priors, troponins unremarkable, BMP unremarkable, COVID-positive flu negative  I ordered imaging studies which  included chest x-ray and I independently    visualized and interpreted imaging which showed atelectasis versus scar base Additional history obtained from patient's family member Previous records obtained and reviewed in epic no recent admissions   After the interventions stated above, I reevaluated the patient and found patient to be fairly asymptomatic.  Eating dinner and not requiring supplemental oxygen.  Stable cardiopulmonary.  Reviewed results of work-up with him.  Feel he is outside the window for Mab.  Recommended symptomatic treatment and close follow-up with PCP.  Return instructions discussed   Final Clinical Impression(s) / ED Diagnoses Final diagnoses:  COVID-19 virus infection    Rx / DC Orders ED Discharge Orders     None        Hayden Rasmussen, MD 07/16/21 1100

## 2021-07-15 NOTE — ED Triage Notes (Signed)
Pt is present today with a chest pain, dizziness, SOB, and weakness. Pt states sx started x3 days ago

## 2021-07-15 NOTE — ED Notes (Signed)
Patient is being discharged from the Urgent Care and sent to the Emergency Department via POV . Per Jamse Arn MD , patient is in need of higher level of care due to chest pain, SOB, and dizziness. Patient is aware and verbalizes understanding of plan of care.  Vitals:   07/15/21 1018  BP: 130/72  Pulse: 80  SpO2: 97%

## 2021-07-15 NOTE — ED Provider Notes (Signed)
Emergency Medicine Provider Triage Evaluation Note  Juan Hatfield , a 75 y.o. male  was evaluated in triage.  Pt complains of chest pain and SOB..  Started 3 days ago, its intermittent.  Chest pain is worsened by exertion, he is also feeling increasingly short of breath.  Having to rest after a few steps for 5 to 10 minutes her symptoms resolved.  Some mild fluid retention in the lower extremities, no history of heart failure.  Review of Systems  Positive: CP, SOB Negative: fever  Physical Exam  BP (!) 153/90 (BP Location: Left Arm)   Pulse 75   Temp 97.9 F (36.6 C) (Oral)   Resp (!) 24   SpO2 98%  Gen:   Awake, no distress   Resp:  Slightly tachypneic without any accessory muscle use.  Rales MSK:   Moves extremities without difficulty  Other:    Medical Decision Making  Medically screening exam initiated at 12:10 PM.  Appropriate orders placed.  Makoa L Espey was informed that the remainder of the evaluation will be completed by another provider, this initial triage assessment does not replace that evaluation, and the importance of remaining in the ED until their evaluation is complete.  Chest pain   Sherrill Raring, Hershal Coria 07/15/21 1212    Isla Pence, MD 07/15/21 1225

## 2021-07-21 ENCOUNTER — Telehealth (HOSPITAL_BASED_OUTPATIENT_CLINIC_OR_DEPARTMENT_OTHER): Payer: Self-pay | Admitting: Family

## 2021-07-21 NOTE — Telephone Encounter (Signed)
Thank you for talking with him!  Per CDC recommendation if still with symptoms >5 days plan for isolation for 10 days. Appropriate alternative of virtual visit was offered and family member declined. Will see as scheduled 07/26/21. We were able to create an add-on appt slot for him.   Loel Dubonnet, NP

## 2021-07-21 NOTE — Telephone Encounter (Signed)
Called pt and spoke with daughter, Juan Hatfield per [place].  Per Laurann Montana, NP:  [name] was seen in the ED and diagnosed with COVID19 on 07/15/21. CDC recommendations are for 5 days of isolation and to stop isolation if no symptoms. If still with shortness of breath, fatigue he should remain in isolation until 10 days which would be 07/25/21. Can we please contact him to offer to switch appointment tomorrow to virtual visit? Also may be beneficial for him to contact primary care. EKG in ED was stable compared to previous.   --Daughter stated pt is no longer having COVID symptoms, but is still having SOB which he was having prior to [place]. She stated she made appt for 12/8 because pt has been declining in his health since his CABG in February and had not been seen since he saw Dr. Radford Pax for follow-up s/p CABG. She stated she does not believe virtual visit would be beneficial d/t pt age and that he needs hands on care. She stated his quality of life has been getting worse and worse.  --Informed pt daughter that if his symptoms are getting worse, he would need to go to the emergency room. Our office policy states patients with diagnosis of COVID must wait 10 days before being seen in the office and be symptom free, otherwise we can offer a virtual visit to evaluate him. In office appointment offered on 07/26/21 at 3:45 pm--ok per Urban Gibson, NP. Pt daughter not happy about this but agreed to appt.

## 2021-07-22 ENCOUNTER — Ambulatory Visit (HOSPITAL_BASED_OUTPATIENT_CLINIC_OR_DEPARTMENT_OTHER): Payer: Medicare Other | Admitting: Family

## 2021-07-26 ENCOUNTER — Ambulatory Visit (HOSPITAL_BASED_OUTPATIENT_CLINIC_OR_DEPARTMENT_OTHER): Payer: Medicare Other | Admitting: Family

## 2021-07-26 NOTE — Progress Notes (Deleted)
Office Visit    Patient Name: Juan Hatfield Date of Encounter: 07/26/2021  PCP:  Wynelle Fanny, [facility]  Cardiologist:  Fransico Him, MD  Advanced Practice Provider:  No care team member to display Electrophysiologist:  None    Chief Complaint    Juan Hatfield is a 75 y.o. male with a hx of DM2, hypertension, renal insufficiency, right bundle branch block, refusal of blood products (Jehovah[employer] Witness), coronary artery disease s/p CABG (LIMA-LAD, SVG-diagonal, SVG-OM 09/2020), OSA, dyspnea on exertion, COVID-19 07/2021 not requiring hospitalization presents today for shortness of breath  Past Medical History    Past Medical History:  Diagnosis Date   Coronary artery calcification seen on CT scan    Diabetes mellitus ([place])    Hypertension    Patient is Jehovah's Witness    Renal insufficiency    Sleep apnea    CPAP nightly   Past Surgical History:  Procedure Laterality Date   BACK SURGERY     CARDIAC CATHETERIZATION     COLONOSCOPY     COLONOSCOPY WITH PROPOFOL N/A 04/02/2019   Procedure: COLONOSCOPY WITH PROPOFOL;  Surgeon: Wilford Corner, MD;  Location: [facility];  Service: Endoscopy;  Laterality: N/A;   CORONARY ARTERY BYPASS GRAFT N/A 09/22/2020   Procedure: CORONARY ARTERY BYPASS GRAFTING (CABG), ON PUMP, TIMES THREE, USING LEFT INTERNAL MAMMARY ARTERY AND ENDOSCOPICALLY HARVESTED RIGHT GREATER SAPHENOUS VEIN;  Surgeon: Wonda Olds, MD;  Location: [place];  Service: Open Heart Surgery;  Laterality: N/A;   ENDOVEIN HARVEST OF GREATER SAPHENOUS VEIN Right 09/22/2020   Procedure: ENDOVEIN HARVEST OF GREATER SAPHENOUS VEIN;  Surgeon: Wonda Olds, MD;  Location: [place];  Service: Open Heart Surgery;  Laterality: Right;   LEFT HEART CATH AND CORONARY ANGIOGRAPHY N/A 07/20/2020   Procedure: LEFT HEART CATH AND CORONARY ANGIOGRAPHY;  Surgeon: Lorretta Harp, MD;  Location: [place] [facility];  Service: Cardiovascular;  Laterality:  N/A;   LUMBAR LAMINECTOMY/DECOMPRESSION MICRODISCECTOMY Right 01/01/2015   Procedure: LUMBAR LAMINECTOMY/DECOMPRESSION MICRODISCECTOMY RIGHT LUMBAR FIVE SACRAL ONE;  Surgeon: Jovita Gamma, MD;  Location: [place] [facility];  Service: Neurosurgery;  Laterality: Right;   POLYPECTOMY  04/02/2019   Procedure: POLYPECTOMY;  Surgeon: Wilford Corner, MD;  Location: [facility];  Service: Endoscopy;;   TEE WITHOUT CARDIOVERSION N/A 09/22/2020   Procedure: TRANSESOPHAGEAL ECHOCARDIOGRAM (TEE);  Surgeon: Wonda Olds, MD;  Location: [place];  Service: Open Heart Surgery;  Laterality: N/A;    Allergies  Allergies  Allergen Reactions   Other     No blood or blood products    History of Present Illness    Juan Hatfield is a 75 y.o. male with a hx of DM2, hypertension, renal insufficiency, right bundle branch block, refusal of blood products (Jehovah's Witness), coronary artery disease s/p CABG (LIMA-LAD, SVG-diagonal, SVG-OM 09/2020), OSA, dyspnea on exertion, COVID-19 07/2021 not requiring hospitalization  last seen 10/05/2020 by Dr. Radford Pax.  He was initially seen for chest pain.  Coronary calcium score elevated.  Nuclear study abnormal.  He was referred for cardiac catheter showed three-vessel disease with preserved LVEF.  He had persistent symptoms and was recommended for CABG which was performed 09/22/2020 with LIMA-LAD, SVG-diagonal, SVG-OM.  Seen most recently in clinic 10/05/2020 by Dr. Radford Pax doing overall well post surgery.  No changes were made at that time.  He was seen in the ED 07/15/21 due to 2-week history of cold-like symptoms.  He tested positive for COVID-19.  He was outside of the window for monoclonal antibodies.   Presents today with chief complaint of dyspnea on exertion. ***  EKGs/Labs/Other Studies Reviewed:   The following studies were reviewed today:  Doppler Pre CABG Carotid/Arms/Legs Summary:  Right Carotid: Velocities in the right ICA are consistent with a 1-39%   stenosis.                The extracranial vessels were near-normal with only minimal  wall                thickening or plaque.   Left Carotid: Velocities in the left ICA are consistent with a 1-39%  stenosis.               The extracranial vessels were near-normal with only minimal  wall               thickening or plaque.  Vertebrals:  Bilateral vertebral arteries demonstrate antegrade flow.  Subclavians: Normal flow hemodynamics were seen in bilateral subclavian               arteries.   Right ABI: Resting right ankle-brachial index indicates noncompressible  right lower extremity arteries. The right toe-brachial index is normal.  Left ABI: Resting left ankle-brachial index indicates noncompressible left  lower extremity arteries. The left toe-brachial index is normal.  Right Upper Extremity: Doppler waveforms decrease >50% with right radial  compression. Doppler waveforms decrease >50% with right ulnar compression.  Left Upper Extremity: Doppler waveforms decrease >50% with left radial  compression. Doppler waveforms remain within normal limits with left ulnar  compression.   Echo 09/11/20  1. Left ventricular ejection fraction, by estimation, is 60 to 65%. The  left ventricle has normal function. The left ventricle has no regional  wall motion abnormalities. Left ventricular diastolic parameters are  consistent with Grade I diastolic  dysfunction (impaired relaxation). Elevated left ventricular end-diastolic  pressure.   2. Right ventricular systolic function is normal. The right ventricular  size is normal. There is normal pulmonary artery systolic pressure.   3. The mitral valve is normal in structure. Trivial mitral valve  regurgitation. No evidence of mitral stenosis.   4. The aortic valve is normal in structure. Aortic valve regurgitation is  not visualized. No aortic stenosis is present.   5. The inferior vena cava is normal in size with greater than 50%  respiratory  variability, suggesting right atrial pressure of 3 mmHg.   EKG:  EKG is ordered today.  The ekg ordered today demonstrates ***  Recent Labs: 09/23/2020: Magnesium 2.3 10/05/2020: ALT 21 07/15/2021: B Natriuretic Peptide 13.9; BUN 21; Creatinine, Ser 1.75; Hemoglobin 13.4; Platelets 185; Potassium 4.7; Sodium 137  Recent Lipid Panel    Component Value Date/Time   CHOL 77 (L) 07/15/2020 0716   TRIG 82 07/15/2020 0716   HDL 47 07/15/2020 0716   CHOLHDL 1.6 07/15/2020 0716   LDLCALC 13 07/15/2020 0716    Risk Assessment/Calculations:  {Does this patient have ATRIAL FIBRILLATION?:[phone]}  Home Medications   No outpatient medications have been marked as taking for the 07/26/21 encounter (Appointment) with Loel Dubonnet, NP.     Review of Systems   ***   All other systems reviewed and are otherwise negative except as noted above.  Physical Exam    VS:  There were no vitals taken for this visit. , BMI There is no height or weight on file to calculate BMI.  Wt Readings from Last 3 Encounters:  07/15/21  228 lb 12.8 oz (103.8 kg)  01/06/21 228 lb 12.8 oz (103.8 kg)  10/12/20 242 lb 3.2 oz (109.9 kg)     GEN: Well nourished, well developed, in no acute distress. HEENT: normal. Neck: Supple, no JVD, carotid bruits, or masses. Cardiac: ***RRR, no murmurs, rubs, or gallops. No clubbing, cyanosis, edema.  ***Radials/PT 2+ and equal bilaterally.  Respiratory:  ***Respirations regular and unlabored, clear to auscultation bilaterally. GI: Soft, nontender, nondistended. MS: No deformity or atrophy. Skin: Warm and dry, no rash. Neuro:  Strength and sensation are intact. Psych: Normal affect.  Assessment & Plan    DOE -   CAD s/p CABGx3 -   Hypertension-   Hyperlipidemia, LDL goal less than 70 -   DM2 -   CKD3a - Careful titration of diuretic and antihypertensive.  ***  Carotid artery stenosis - Duplex 09/11/20 bilateral 1-39% stenosis. ***  Disposition: Follow up  {follow up:15908} with Fransico Him, MD or APP.  Signed, Loel Dubonnet, NP 07/26/2021, 11:56 AM [place]

## 2021-07-27 ENCOUNTER — Encounter (HOSPITAL_BASED_OUTPATIENT_CLINIC_OR_DEPARTMENT_OTHER): Payer: Self-pay | Admitting: Family

## 2021-07-27 ENCOUNTER — Ambulatory Visit (HOSPITAL_BASED_OUTPATIENT_CLINIC_OR_DEPARTMENT_OTHER): Payer: Medicare Other | Admitting: Family

## 2021-07-27 ENCOUNTER — Other Ambulatory Visit: Payer: Self-pay

## 2021-07-27 VITALS — BP 132/72 | HR 70 | Ht 66.0 in | Wt 242.9 lb

## 2021-07-27 DIAGNOSIS — E785 Hyperlipidemia, unspecified: Secondary | ICD-10-CM

## 2021-07-27 DIAGNOSIS — I25118 Atherosclerotic heart disease of native coronary artery with other forms of angina pectoris: Secondary | ICD-10-CM | POA: Diagnosis not present

## 2021-07-27 DIAGNOSIS — Z8616 Personal history of COVID-19: Secondary | ICD-10-CM | POA: Diagnosis not present

## 2021-07-27 DIAGNOSIS — I10 Essential (primary) hypertension: Secondary | ICD-10-CM | POA: Diagnosis not present

## 2021-07-27 DIAGNOSIS — Z951 Presence of aortocoronary bypass graft: Secondary | ICD-10-CM

## 2021-07-27 DIAGNOSIS — E119 Type 2 diabetes mellitus without complications: Secondary | ICD-10-CM

## 2021-07-27 DIAGNOSIS — I6523 Occlusion and stenosis of bilateral carotid arteries: Secondary | ICD-10-CM

## 2021-07-27 DIAGNOSIS — R0600 Dyspnea, unspecified: Secondary | ICD-10-CM

## 2021-07-27 MED ORDER — ASPIRIN EC 81 MG PO TBEC: 81.0000 mg | DELAYED_RELEASE_TABLET | Freq: Every day | ORAL | 3 refills | Status: AC

## 2021-07-27 MED ORDER — FUROSEMIDE 20 MG PO TABS: 40.0000 mg | ORAL_TABLET | Freq: Every day | ORAL | 3 refills | Status: DC

## 2021-07-27 NOTE — Patient Instructions (Addendum)
Medication Instructions:  Your physician has recommended you make the following change in your medication:   CHANGE Furosemide (Lasix) to 2 tablets (40mg ) daily  CHANGE Aspirin to 81mg  daily  *If you need a refill on your cardiac medications before your next appointment, please call your pharmacy*   Lab Work: Your physician recommends that you return for lab work in 1 weeks: BMP, BNP   If you have labs (blood work) drawn today and your tests are completely normal, you will receive your results only by: [place] (if you have MyChart) OR A paper copy in the mail If you have any lab test that is abnormal or we need to change your treatment, we will call you to review the results.   Testing/Procedures: Your physician has requested that you have an echocardiogram. Echocardiography is a painless test that uses sound waves to create images of your heart. It provides your doctor with information about the size and shape of your heart and how well your hearts chambers and valves are working. This procedure takes approximately one hour. There are no restrictions for this procedure.   Your EKG today was stable compared to previous.   Follow-Up: At [facility], you and your health needs are our priority.  As part of our continuing mission to provide you with exceptional heart care, we have created designated Provider Care Teams.  These Care Teams include your primary Cardiologist (physician) and Advanced Practice Providers (APPs -  Physician Assistants and Nurse Practitioners) who all work together to provide you with the care you need, when you need it.  We recommend signing up for the patient portal called "MyChart".  Sign up information is provided on this After Visit Summary.  MyChart is used to connect with patients for Virtual Visits (Telemedicine).  Patients are able to view lab/test results, encounter notes, upcoming appointments, etc.  Non-urgent messages can be sent to your  provider as well.   To learn more about what you can do with MyChart, go to [email or web address].    Your next appointment:   4-6 weeks  The format for your next appointment:   In Person  Provider:   Fransico Him, MD or Advanced Practice Provider     Other Instructions  Recommend drinking less than 2 liters of fluid per day.   Heart Healthy Diet Recommendations: A low-salt diet is recommended. Meats should be grilled, baked, or boiled. Avoid fried foods. Focus on lean protein sources like fish or chicken with vegetables and fruits. The American Heart Association is a Microbiologist!  American Heart Association Diet and Lifeystyle Recommendations    Exercise recommendations: The American Heart Association recommends 150 minutes of moderate intensity exercise weekly. Try 30 minutes of moderate intensity exercise 4-5 times per week. This could include walking, jogging, or swimming.   You have been referred to cardiac rehab.

## 2021-07-27 NOTE — Progress Notes (Signed)
Office Visit    Patient Name: Juan Hatfield Date of Encounter: 07/27/2021  PCP:  Wynelle Fanny, [facility]  Cardiologist:  Fransico Him, MD  Advanced Practice Provider:  No care team member to display Electrophysiologist:  None    Chief Complaint    Juan Hatfield is a 75 y.o. male with a hx of DM2, hypertension, renal insufficiency, right bundle branch block, refusal of blood products (Jehovah's Witness), coronary artery disease s/p CABG (LIMA-LAD, SVG-diagonal, SVG-OM 09/2020), OSA, dyspnea on exertion, COVID-19 07/2021 not requiring hospitalization presents today for shortness of breath  Past Medical History    Past Medical History:  Diagnosis Date   Coronary artery calcification seen on CT scan    Diabetes mellitus ([place])    Hypertension    Patient is Jehovah's Witness    Renal insufficiency    Sleep apnea    CPAP nightly   Past Surgical History:  Procedure Laterality Date   BACK SURGERY     CARDIAC CATHETERIZATION     COLONOSCOPY     COLONOSCOPY WITH PROPOFOL N/A 04/02/2019   Procedure: COLONOSCOPY WITH PROPOFOL;  Surgeon: Wilford Corner, MD;  Location: [facility];  Service: Endoscopy;  Laterality: N/A;   CORONARY ARTERY BYPASS GRAFT N/A 09/22/2020   Procedure: CORONARY ARTERY BYPASS GRAFTING (CABG), ON PUMP, TIMES THREE, USING LEFT INTERNAL MAMMARY ARTERY AND ENDOSCOPICALLY HARVESTED RIGHT GREATER SAPHENOUS VEIN;  Surgeon: Wonda Olds, MD;  Location: [place];  Service: Open Heart Surgery;  Laterality: N/A;   ENDOVEIN HARVEST OF GREATER SAPHENOUS VEIN Right 09/22/2020   Procedure: ENDOVEIN HARVEST OF GREATER SAPHENOUS VEIN;  Surgeon: Wonda Olds, MD;  Location: [place];  Service: Open Heart Surgery;  Laterality: Right;   LEFT HEART CATH AND CORONARY ANGIOGRAPHY N/A 07/20/2020   Procedure: LEFT HEART CATH AND CORONARY ANGIOGRAPHY;  Surgeon: Lorretta Harp, MD;  Location: [place] [facility];  Service: Cardiovascular;  Laterality:  N/A;   LUMBAR LAMINECTOMY/DECOMPRESSION MICRODISCECTOMY Right 01/01/2015   Procedure: LUMBAR LAMINECTOMY/DECOMPRESSION MICRODISCECTOMY RIGHT LUMBAR FIVE SACRAL ONE;  Surgeon: Jovita Gamma, MD;  Location: [place] [facility];  Service: Neurosurgery;  Laterality: Right;   POLYPECTOMY  04/02/2019   Procedure: POLYPECTOMY;  Surgeon: Wilford Corner, MD;  Location: [facility];  Service: Endoscopy;;   TEE WITHOUT CARDIOVERSION N/A 09/22/2020   Procedure: TRANSESOPHAGEAL ECHOCARDIOGRAM (TEE);  Surgeon: Wonda Olds, MD;  Location: [place];  Service: Open Heart Surgery;  Laterality: N/A;    Allergies  Allergies  Allergen Reactions   Other     No blood or blood products    History of Present Illness    Chung L Kidane is a 75 y.o. male with a hx of DM2, hypertension, renal insufficiency, right bundle branch block, refusal of blood products (Jehovah's Witness), coronary artery disease s/p CABG (LIMA-LAD, SVG-diagonal, SVG-OM 09/2020), OSA, dyspnea on exertion, COVID-19 07/2021 not requiring hospitalization  last seen 10/05/2020 by Dr. Radford Pax.  He was initially seen for chest pain.  Coronary calcium score elevated.  Nuclear study abnormal.  He was referred for cardiac catheter showed three-vessel disease with preserved LVEF.  He had persistent symptoms and was recommended for CABG which was performed 09/22/2020 with LIMA-LAD, SVG-diagonal, SVG-OM.  Seen most recently in clinic 10/05/2020 by Dr. Radford Pax doing overall well post surgery.  No changes were made at that time.  He was seen in the ED 07/15/21 due to 2-week history of cold-like symptoms.  He tested positive for COVID-19.  He was outside of the window for monoclonal antibodies.   Presents today with chief complaint of dyspnea on exertion. When seen by Dr.  Radford Pax at [facility] office 10/05/20 he weighed 234 lbs. Today by our scale weighs 242 lbs. Has not been weighing at home. Notes dyspnea on exertion which has been ongoing since his operation for CABG. Tells  me it is staying about the same, but might be a little bit better.  His daughter is more concerned. He is overall sedentary and per her report gets dyspneic with even walking from room to room.He reports no orthopnea nor PND though has slept in a chair for many years. He wears his CPAP most every night. He reports lower extremity edema resolved with compression socks. Drinks <2L fluid throughout the day. Endorses occasional lightheadedness with quick position changes though overall not bothersome. Reports no chest pain but notes some tightness which feels like it is his "nerves healing" after open heart surgery which is more often with weather changes, occurs at rest, and self resolves.   EKGs/Labs/Other Studies Reviewed:   The following studies were reviewed today:  Doppler Pre CABG Carotid/Arms/Legs Summary:  Right Carotid: Velocities in the right ICA are consistent with a 1-39%  stenosis.                The extracranial vessels were near-normal with only minimal  wall                thickening or plaque.   Left Carotid: Velocities in the left ICA are consistent with a 1-39%  stenosis.               The extracranial vessels were near-normal with only minimal  wall               thickening or plaque.  Vertebrals:  Bilateral vertebral arteries demonstrate antegrade flow.  Subclavians: Normal flow hemodynamics were seen in bilateral subclavian               arteries.   Right ABI: Resting right ankle-brachial index indicates noncompressible  right lower extremity arteries. The right toe-brachial index is normal.  Left ABI: Resting left ankle-brachial index indicates noncompressible left  lower extremity arteries. The left toe-brachial index is normal.  Right Upper Extremity: Doppler waveforms decrease >50% with right radial  compression. Doppler waveforms decrease >50% with right ulnar compression.  Left Upper Extremity: Doppler waveforms decrease >50% with left radial  compression.  Doppler waveforms remain within normal limits with left ulnar  compression.   Echo 09/11/20  1. Left ventricular ejection fraction, by estimation, is 60 to 65%. The  left ventricle has normal function. The left ventricle has no regional  wall motion abnormalities. Left ventricular diastolic parameters are  consistent with Grade I diastolic  dysfunction (impaired relaxation). Elevated left ventricular end-diastolic  pressure.   2. Right ventricular systolic function is normal. The right ventricular  size is normal. There is normal pulmonary artery systolic pressure.   3. The mitral valve is normal in structure. Trivial mitral valve  regurgitation. No evidence of mitral stenosis.   4. The aortic valve is normal in structure. Aortic valve regurgitation is  not visualized. No aortic stenosis is present.   5. The inferior vena cava is normal in size with greater than 50%  respiratory variability, suggesting right atrial pressure of 3 mmHg.   EKG:  EKG ordered today. The EKG performed today shows SR 90 bpm with first degree AV block PR 244ms  and known RBBB. No acute St/T wave changes.   Recent Labs: 09/23/2020: Magnesium 2.3 10/05/2020: ALT 21 07/15/2021: B Natriuretic Peptide 13.9; BUN 21; Creatinine, Ser 1.75; Hemoglobin 13.4; Platelets 185; Potassium 4.7; Sodium 137  Recent Lipid Panel    Component Value Date/Time   CHOL 77 (L) 07/15/2020 0716   TRIG 82 07/15/2020 0716   HDL 47 07/15/2020 0716   CHOLHDL 1.6 07/15/2020 0716   LDLCALC 13 07/15/2020 0716   Home Medications   Current Meds  Medication Sig   acetaminophen (TYLENOL) 500 MG tablet Take 2 tablets (1,000 mg total) by mouth every 6 (six) hours as needed.   aspirin EC 81 MG tablet Take 1 tablet (81 mg total) by mouth daily. Swallow whole.   Cholecalciferol (VITAMIN D-3) 125 MCG (5000 UT) TABS Take 1 tablet by mouth daily at 2 PM.   Coenzyme Q10 (CO Q 10) 100 MG CAPS Take 100 mg by mouth daily.    FEROSUL 325 (65 Fe) MG tablet  Take 325 mg by mouth daily.   Grape Seed Extract 50 MG CAPS Take 2 capsules by mouth daily.   isosorbide mononitrate (IMDUR) 30 MG 24 hr tablet Take 30 mg by mouth daily.   Lancet Devices (ONETOUCH DELICA PLUS LANCING) MISC    Lancets (ONETOUCH DELICA PLUS [id number]) MISC Apply 1 each topically daily.   LEVEMIR FLEXTOUCH 100 UNIT/ML FlexPen Inject 40 Units into the skin daily.   losartan (COZAAR) 25 MG tablet Take 25 mg by mouth daily.   Magnesium 250 MG TABS Take 250 mg by mouth daily.   milk thistle 175 MG tablet Take 175 mg by mouth daily.   Multiple Vitamins-Minerals (ALIVE ENERGY 50+) TABS Take 1 tablet by mouth daily.   ONETOUCH ULTRA test strip    oxyCODONE-acetaminophen (PERCOCET/ROXICET) 5-325 MG tablet Take 1 tablet by mouth 2 (two) times daily as needed for severe pain.   pantoprazole (PROTONIX) 20 MG tablet Take 20 mg by mouth daily.   Potassium Chloride ER 20 MEQ TBCR Take 1 tablet by mouth daily.   potassium chloride SA (KLOR-CON M) 20 MEQ tablet Take 20 mEq by mouth daily.   rosuvastatin (CRESTOR) 5 MG tablet Take 5 mg by mouth daily.   sitaGLIPtin-metformin (JANUMET) 50-1000 MG per tablet Take 1 tablet by mouth 2 (two) times daily with a meal.   vitamin C (ASCORBIC ACID) 500 MG tablet Take 2,000 mg by mouth daily.   [DISCONTINUED] aspirin EC 325 MG EC tablet Take 1 tablet (325 mg total) by mouth daily.   [DISCONTINUED] furosemide (LASIX) 20 MG tablet Take 20 mg by mouth daily.     Review of Systems      All other systems reviewed and are otherwise negative except as noted above.  Physical Exam    VS:  BP 132/72    Pulse 70    Ht 5\' 6"  (1.676 m)    Wt 242 lb 14.4 oz (110.2 kg)    BMI 39.21 kg/m  , BMI Body mass index is 39.21 kg/m.  Wt Readings from Last 3 Encounters:  07/27/21 242 lb 14.4 oz (110.2 kg)  07/15/21 228 lb 12.8 oz (103.8 kg)  01/06/21 228 lb 12.8 oz (103.8 kg)    GEN: Well nourished, overweight,  well developed, in no acute distress. HEENT:  normal. Neck: Supple, no JVD, carotid bruits, or masses. Cardiac: RRR, no murmurs, rubs, or gallops. No clubbing, cyanosis, edema.  Radials/PT 2+ and equal bilaterally.  Respiratory:  Respirations regular and unlabored,  clear to auscultation bilaterally. GI: Soft, nontender, nondistended. MS: No deformity or atrophy. Skin: Warm and dry, no rash. Neuro:  Strength and sensation are intact. Psych: Normal affect.  Assessment & Plan    DOE - Exertional dyspnea since CABG. Consider etiology volume overload, deconditioning, angina. Most consistent with volume overload as abdomen extended and weight up. Increase Lasix to 40mg  QD with repeat BMP in 1 week. 07/15/21 Hb 13.4 - no evidence of anemia as contributory. Referred to cardiac rehab due to deconditioning. If no improvement at follow up in 4-6 weeks, consider increasing Imdur.  CAD s/p CABGx3 - Reports only chest discomfort consistent with incisional soreness. Endorses DOE as detailed above. EKG no acute changes. GDMT includes aspirin, imdur, rosuvastatin, PRN nitroglycerin. Reduce aspirin to 81mg  QD. Heart healthy diet and regular cardiovascular exercise encouraged.  If dyspnea unchanged with Lasix, consider increasing dose Imdur.   Hypertension- BP above goal <130/80. Increase lasix, as above. Continue imdur 30mg  QD, Coreg 3.125mg  BID, Losartn 20mg  QD.  Hyperlipidemia, LDL goal less than 70 - 07/16/20 LDL 13. Repeat direct LDL today as >1 year. Denies myalgias. Continue Crestor 5mg  QD.   DM2 - Continue to follow with PCP.   GLO7F - Careful titration of diuretic and antihypertensive.  BMP in 1 week.  Carotid artery stenosis - Duplex 09/11/20 bilateral 1-39% stenosis. Continue optimal BP and lipid control.   Disposition: Follow up in 4-6 week(s) with Fransico Him, MD or APP.  Signed, Loel Dubonnet, NP 07/27/2021, 4:34 PM  [facility]

## 2021-07-30 ENCOUNTER — Ambulatory Visit (HOSPITAL_COMMUNITY): Payer: Medicare Other | Attending: Family

## 2021-07-30 ENCOUNTER — Other Ambulatory Visit: Payer: Self-pay

## 2021-07-30 DIAGNOSIS — R0600 Dyspnea, unspecified: Secondary | ICD-10-CM | POA: Insufficient documentation

## 2021-07-30 LAB — ECHOCARDIOGRAM COMPLETE
Area-P 1/2: 4.8 cm2
Calc EF: 63 %
S' Lateral: 2.6 cm
Single Plane A2C EF: 69.4 %
Single Plane A4C EF: 61.1 %

## 2021-08-02 ENCOUNTER — Encounter (HOSPITAL_BASED_OUTPATIENT_CLINIC_OR_DEPARTMENT_OTHER): Payer: Self-pay

## 2021-08-27 ENCOUNTER — Other Ambulatory Visit: Payer: Self-pay

## 2021-08-27 ENCOUNTER — Encounter (HOSPITAL_BASED_OUTPATIENT_CLINIC_OR_DEPARTMENT_OTHER): Payer: Self-pay | Admitting: Family

## 2021-08-27 ENCOUNTER — Ambulatory Visit (HOSPITAL_BASED_OUTPATIENT_CLINIC_OR_DEPARTMENT_OTHER): Payer: Medicare HMO | Admitting: Family

## 2021-08-27 ENCOUNTER — Ambulatory Visit (HOSPITAL_BASED_OUTPATIENT_CLINIC_OR_DEPARTMENT_OTHER): Payer: Medicare Other | Admitting: Family

## 2021-08-27 VITALS — BP 126/70 | HR 92 | Ht 66.0 in | Wt 245.3 lb

## 2021-08-27 DIAGNOSIS — I10 Essential (primary) hypertension: Secondary | ICD-10-CM

## 2021-08-27 DIAGNOSIS — I25118 Atherosclerotic heart disease of native coronary artery with other forms of angina pectoris: Secondary | ICD-10-CM

## 2021-08-27 DIAGNOSIS — E119 Type 2 diabetes mellitus without complications: Secondary | ICD-10-CM | POA: Diagnosis not present

## 2021-08-27 DIAGNOSIS — R0609 Other forms of dyspnea: Secondary | ICD-10-CM

## 2021-08-27 DIAGNOSIS — E785 Hyperlipidemia, unspecified: Secondary | ICD-10-CM

## 2021-08-27 NOTE — Patient Instructions (Addendum)
Medication Instructions:  Continue your current medications.   *If you need a refill on your cardiac medications before your next appointment, please call your pharmacy*   Lab Work: Your physician recommends that you return for lab work today for BMET, BNP, direct LDL  This is to ensure your kidney function has remained stable on increased dose of Lasix.   If you have labs (blood work) drawn today and your tests are completely normal, you will receive your results only by: [place] (if you have MyChart) OR A paper copy in the mail If you have any lab test that is abnormal or we need to change your treatment, we will call you to review the results.   Testing/Procedures: Your recent echocardiogram showed normal heart pumping function.   Follow-Up: At [facility], you and your health needs are our priority.  As part of our continuing mission to provide you with exceptional heart care, we have created designated Provider Care Teams.  These Care Teams include your primary Cardiologist (physician) and Advanced Practice Providers (APPs -  Physician Assistants and Nurse Practitioners) who all work together to provide you with the care you need, when you need it.  We recommend signing up for the patient portal called "MyChart".  Sign up information is provided on this After Visit Summary.  MyChart is used to connect with patients for Virtual Visits (Telemedicine).  Patients are able to view lab/test results, encounter notes, upcoming appointments, etc.  Non-urgent messages can be sent to your provider as well.   To learn more about what you can do with MyChart, go to [email or web address].    Your next appointment:   4-6 month(s)  The format for your next appointment:   In Person  Provider:   Fransico Him, MD     Other Instructions You have been referred to cardiac rehab. Their phone number is [phone]  Heart Healthy Diet Recommendations: A low-salt diet is recommended.  Meats should be grilled, baked, or boiled. Avoid fried foods. Focus on lean protein sources like fish or chicken with vegetables and fruits. The American Heart Association is a Microbiologist!  American Heart Association Diet and Lifeystyle Recommendations    Exercise recommendations: The American Heart Association recommends 150 minutes of moderate intensity exercise weekly. Try 30 minutes of moderate intensity exercise 4-5 times per week. This could include walking, jogging, or swimming.

## 2021-08-27 NOTE — Progress Notes (Signed)
Office Visit    Patient Name: Juan Hatfield Date of Encounter: 08/27/2021  PCP:  Wynelle Fanny, [facility]  Cardiologist:  Fransico Him, MD  Advanced Practice Provider:  No care team member to display Electrophysiologist:  None    Chief Complaint    Juan Hatfield is a 76 y.o. male with a hx of DM2, hypertension, renal insufficiency, right bundle branch block, refusal of blood products (Jeh[employer]ah[employer] Witness), coronary artery disease s/p CABG (LIMA-LAD, SVG-diagonal, SVG-OM 09/2020), OSA, dyspnea on exertion, COVID-19 07/2021 not requiring hospitalization presents today for follow up after echocardiogram.   Past Medical History    Past Medical History:  Diagnosis Date   Coronary artery calcification seen on CT scan    Diabetes mellitus ([place])    Hypertension    Patient is Jehovah's Witness    Renal insufficiency    Sleep apnea    CPAP nightly   Past Surgical History:  Procedure Laterality Date   BACK SURGERY     CARDIAC CATHETERIZATION     COLONOSCOPY     COLONOSCOPY WITH PROPOFOL N/A 04/02/2019   Procedure: COLONOSCOPY WITH PROPOFOL;  Surgeon: Wilford Corner, MD;  Location: [facility];  Service: Endoscopy;  Laterality: N/A;   CORONARY ARTERY BYPASS GRAFT N/A 09/22/2020   Procedure: CORONARY ARTERY BYPASS GRAFTING (CABG), ON PUMP, TIMES THREE, USING LEFT INTERNAL MAMMARY ARTERY AND ENDOSCOPICALLY HARVESTED RIGHT GREATER SAPHENOUS VEIN;  Surgeon: Wonda Olds, MD;  Location: [place];  Service: Open Heart Surgery;  Laterality: N/A;   ENDOVEIN HARVEST OF GREATER SAPHENOUS VEIN Right 09/22/2020   Procedure: ENDOVEIN HARVEST OF GREATER SAPHENOUS VEIN;  Surgeon: Wonda Olds, MD;  Location: [place];  Service: Open Heart Surgery;  Laterality: Right;   LEFT HEART CATH AND CORONARY ANGIOGRAPHY N/A 07/20/2020   Procedure: LEFT HEART CATH AND CORONARY ANGIOGRAPHY;  Surgeon: Lorretta Harp, MD;  Location: [place] [facility];  Service: Cardiovascular;   Laterality: N/A;   LUMBAR LAMINECTOMY/DECOMPRESSION MICRODISCECTOMY Right 01/01/2015   Procedure: LUMBAR LAMINECTOMY/DECOMPRESSION MICRODISCECTOMY RIGHT LUMBAR FIVE SACRAL ONE;  Surgeon: Jovita Gamma, MD;  Location: [place] [facility];  Service: Neurosurgery;  Laterality: Right;   POLYPECTOMY  04/02/2019   Procedure: POLYPECTOMY;  Surgeon: Wilford Corner, MD;  Location: [facility];  Service: Endoscopy;;   TEE WITHOUT CARDIOVERSION N/A 09/22/2020   Procedure: TRANSESOPHAGEAL ECHOCARDIOGRAM (TEE);  Surgeon: Wonda Olds, MD;  Location: [place];  Service: Open Heart Surgery;  Laterality: N/A;    Allergies  Allergies  Allergen Reactions   Other     No blood or blood products    History of Present Illness    Juan Hatfield is a 76 y.o. male with a hx of DM2, hypertension, renal insufficiency, right bundle branch block, refusal of blood products (Jehovah's Witness), coronary artery disease s/p CABG (LIMA-LAD, SVG-diagonal, SVG-OM 09/2020), OSA, dyspnea on exertion, COVID-19 07/2021 not requiring hospitalization  last seen 07/27/21  He was initially seen for chest pain.  Coronary calcium score elevated.  Nuclear study abnormal.  He was referred for cardiac catheter showed three-vessel disease with preserved LVEF.  He had persistent symptoms and was recommended for CABG which was performed 09/22/2020 with LIMA-LAD, SVG-diagonal, SVG-OM.  Seen most recently in clinic 10/05/2020 by Dr. Radford Pax doing overall well post surgery.  No changes were made at that time.  He was seen in the ED 07/15/21 due to 2-week history of cold-like symptoms.  He tested positive for COVID-19.  He  was outside of the window for monoclonal antibodies.   He was seen 07/21/2021 with his daughter due to reported increased dyspnea on exertion.  Lasix was increased to 40 mg and he was referred to cardiac rehab. Echo 07/30/21 with normal LVEF, gr1DD, mild LVH, no significant valvular abnormalities.  He presents today for follow-up with  his daughter. Has not yet been contacted by cardiac rehab but has been exercising on exercise bike at home.  He reports no chest pain, pressure, tightness.  Reports his dyspnea on exertion is overall improving.  Denies orthopnea, PND.  His lower extremity edema is controlled with compression stockings.  He does note since increased dose of Lasix he is going to the bathroom more often though overall not bothersome. We reviewed his echocardiogram.    EKGs/Labs/Other Studies Reviewed:   The following studies were reviewed today: Echo 07/30/21 1. Mild intracavitary gradient. Peak velocity 1.39 m/s. Peak gradient 7.7  mmHg. Left ventricular ejection fraction, by estimation, is 65 to 70%. The  left ventricle has normal function. The left ventricle has no regional  wall motion abnormalities. There  is mild concentric left ventricular hypertrophy. Left ventricular  diastolic parameters are consistent with Grade I diastolic dysfunction  (impaired relaxation).   2. Right ventricular systolic function is normal. The right ventricular  size is normal.   3. The mitral valve is normal in structure. No evidence of mitral valve  regurgitation. No evidence of mitral stenosis.   4. The aortic valve is tricuspid. Aortic valve regurgitation is not  visualized. No aortic stenosis is present.   5. The inferior vena cava is normal in size with greater than 50%  respiratory variability, suggesting right atrial pressure of 3 mmHg.   Doppler Pre CABG Carotid/Arms/Legs 09/11/20 Summary:  Right Carotid: Velocities in the right ICA are consistent with a 1-39%  stenosis.                The extracranial vessels were near-normal with only minimal  wall                thickening or plaque.   Left Carotid: Velocities in the left ICA are consistent with a 1-39%  stenosis.               The extracranial vessels were near-normal with only minimal  wall               thickening or plaque.  Vertebrals:  Bilateral  vertebral arteries demonstrate antegrade flow.  Subclavians: Normal flow hemodynamics were seen in bilateral subclavian               arteries.   Right ABI: Resting right ankle-brachial index indicates noncompressible  right lower extremity arteries. The right toe-brachial index is normal.  Left ABI: Resting left ankle-brachial index indicates noncompressible left  lower extremity arteries. The left toe-brachial index is normal.  Right Upper Extremity: Doppler waveforms decrease >50% with right radial  compression. Doppler waveforms decrease >50% with right ulnar compression.  Left Upper Extremity: Doppler waveforms decrease >50% with left radial  compression. Doppler waveforms remain within normal limits with left ulnar  compression.   Echo 09/11/20  1. Left ventricular ejection fraction, by estimation, is 60 to 65%. The  left ventricle has normal function. The left ventricle has no regional  wall motion abnormalities. Left ventricular diastolic parameters are  consistent with Grade I diastolic  dysfunction (impaired relaxation). Elevated left ventricular end-diastolic  pressure.   2. Right ventricular systolic function  is normal. The right ventricular  size is normal. There is normal pulmonary artery systolic pressure.   3. The mitral valve is normal in structure. Trivial mitral valve  regurgitation. No evidence of mitral stenosis.   4. The aortic valve is normal in structure. Aortic valve regurgitation is  not visualized. No aortic stenosis is present.   5. The inferior vena cava is normal in size with greater than 50%  respiratory variability, suggesting right atrial pressure of 3 mmHg.   EKG:  No EKG today.   Recent Labs: 09/23/2020: Magnesium 2.3 10/05/2020: ALT 21 07/15/2021: B Natriuretic Peptide 13.9; BUN 21; Creatinine, Ser 1.75; Hemoglobin 13.4; Platelets 185; Potassium 4.7; Sodium 137  Recent Lipid Panel    Component Value Date/Time   CHOL 77 (L) 07/15/2020 0716   TRIG  82 07/15/2020 0716   HDL 47 07/15/2020 0716   CHOLHDL 1.6 07/15/2020 0716   LDLCALC 13 07/15/2020 0716   Home Medications   Current Meds  Medication Sig   carvedilol (COREG) 3.125 MG tablet Take 1 tablet (3.125 mg total) by mouth 2 (two) times daily.   Cholecalciferol (VITAMIN D-3) 125 MCG (5000 UT) TABS Take 1 tablet by mouth daily at 2 PM.   Coenzyme Q10 (CO Q 10) 100 MG CAPS Take 100 mg by mouth daily.    FEROSUL 325 (65 Fe) MG tablet Take 325 mg by mouth daily.   furosemide (LASIX) 20 MG tablet Take 2 tablets (40 mg total) by mouth daily.   Grape Seed Extract 50 MG CAPS Take 2 capsules by mouth daily.   isosorbide mononitrate (IMDUR) 30 MG 24 hr tablet Take 30 mg by mouth daily.   Lancet Devices (ONETOUCH DELICA PLUS LANCING) MISC    Lancets (ONETOUCH DELICA PLUS [id number]) MISC Apply 1 each topically daily.   LEVEMIR FLEXTOUCH 100 UNIT/ML FlexPen Inject 40 Units into the skin daily.   losartan (COZAAR) 25 MG tablet Take 25 mg by mouth daily.   Magnesium 250 MG TABS Take 250 mg by mouth daily.   milk thistle 175 MG tablet Take 175 mg by mouth daily.   Multiple Vitamins-Minerals (ALIVE ENERGY 50+) TABS Take 1 tablet by mouth daily.   nitroGLYCERIN (NITROSTAT) 0.4 MG SL tablet PLACE 1 TABLET (0.4 MG TOTAL) UNDER THE TONGUE EVERY FIVE MINUTES AS NEEDED FOR CHEST PAIN (UP TO 3 DOSES).   ONETOUCH ULTRA test strip    oxyCODONE-acetaminophen (PERCOCET/ROXICET) 5-325 MG tablet Take 1 tablet by mouth 2 (two) times daily as needed for severe pain.   pantoprazole (PROTONIX) 20 MG tablet Take 20 mg by mouth daily.   Potassium Chloride ER 20 MEQ TBCR Take 1 tablet by mouth daily.   potassium chloride SA (KLOR-CON M) 20 MEQ tablet Take 20 mEq by mouth daily.   rosuvastatin (CRESTOR) 5 MG tablet Take 5 mg by mouth daily.   sitaGLIPtin-metformin (JANUMET) 50-1000 MG per tablet Take 1 tablet by mouth 2 (two) times daily with a meal.   vitamin C (ASCORBIC ACID) 500 MG tablet Take 2,000 mg by mouth  daily.     Review of Systems      All other systems reviewed and are otherwise negative except as noted above.  Physical Exam    VS:  BP 126/70    Pulse 92    Ht 5\' 6"  (1.676 m)    Wt 245 lb 4.8 oz (111.3 kg)    SpO2 95%    BMI 39.59 kg/m  , BMI Body mass index is 39.59  kg/m.  Wt Readings from Last 3 Encounters:  08/27/21 245 lb 4.8 oz (111.3 kg)  07/27/21 242 lb 14.4 oz (110.2 kg)  07/15/21 228 lb 12.8 oz (103.8 kg)    GEN: Well nourished, overweight,  well developed, in no acute distress. HEENT: normal. Neck: Supple, no JVD, carotid bruits, or masses. Cardiac: RRR, no murmurs, rubs, or gallops. No clubbing, cyanosis, edema.  Radials/PT 2+ and equal bilaterally.  Respiratory:  Respirations regular and unlabored, clear to auscultation bilaterally. GI: Soft, nontender, nondistended. MS: No deformity or atrophy. Skin: Warm and dry, no rash. Neuro:  Strength and sensation are intact. Psych: Normal affect.  Assessment & Plan    DOE - Echo 07/2021 normal LVEF, mild LVH, gr1DD. 07/15/21 Hb 13.4 - no evidence of anemia as contributory. Improving since increased diuresis. Deconditioning likely contributory. Continue regular exercise. BMP, BNP today for monitoring.   CAD s/p CABGx3 - Stable with no anginal symptoms. No indication for ischemic evaluation.  GDMT includes aspirin, imdur, rosuvastatin, PRN nitroglycerin.  Heart healthy diet and regular cardiovascular exercise encouraged.    Hypertension- BP well controlled. Continue current antihypertensive regimen.  Continue imdur 30mg  QD, Coreg 3.125mg  BID, Losartan  20mg  QD.  Hyperlipidemia, LDL goal less than 70 -  Continue Crestor 5mg  QD. Direct LDL today for monitoring.   DM2 - Continue to follow with PCP.   LMB8M - Careful titration of diuretic and antihypertensive.  BMP today.  Carotid artery stenosis - Duplex 09/11/20 bilateral 1-39% stenosis. Continue optimal BP and lipid control.   Disposition: Follow up 4-6 months with Fransico Him, MD or APP.  Signed, Loel Dubonnet, NP 08/27/2021, 4:16 PM [place]

## 2021-08-28 LAB — BASIC METABOLIC PANEL
BUN/Creatinine Ratio: 9 — ABNORMAL LOW (ref 10–24)
BUN: 18 mg/dL (ref 8–27)
CO2: 25 mmol/L (ref 20–29)
Calcium: 10.2 mg/dL (ref 8.6–10.2)
Chloride: 100 mmol/L (ref 96–106)
Creatinine, Ser: 1.92 mg/dL — ABNORMAL HIGH (ref 0.76–1.27)
Glucose: 330 mg/dL — ABNORMAL HIGH (ref 70–99)
Potassium: 4.8 mmol/L (ref 3.5–5.2)
Sodium: 142 mmol/L (ref 134–144)
eGFR: 36 mL/min/{1.73_m2} — ABNORMAL LOW (ref >59)

## 2021-08-28 LAB — LDL CHOLESTEROL, DIRECT: LDL Direct: 14 mg/dL (ref 0–99)

## 2021-08-28 LAB — BRAIN NATRIURETIC PEPTIDE: BNP: 7.5 pg/mL (ref 0.0–100.0)

## 2021-08-30 ENCOUNTER — Telehealth (HOSPITAL_BASED_OUTPATIENT_CLINIC_OR_DEPARTMENT_OTHER): Payer: Self-pay

## 2021-08-30 MED ORDER — FUROSEMIDE 20 MG PO TABS: 20.0000 mg | ORAL_TABLET | Freq: Every day | ORAL | 3 refills | Status: AC

## 2021-08-30 NOTE — Telephone Encounter (Signed)
Called patient and reviewed lab work. Patient and daughter present and verbalize understanding.    "BNP with no volume overload. Kidney function decreased from baseline. Recommend reducing Lasix to 20mg  daily with additional 20mg  PRN for weight gain of 2lb overnight, 5 lb one week, or lower extremity edema. Direct LDL at goal - good result! "

## 2021-08-30 NOTE — Telephone Encounter (Signed)
Med list updated

## 2021-08-31 ENCOUNTER — Telehealth (HOSPITAL_COMMUNITY): Payer: Self-pay

## 2021-08-31 NOTE — Telephone Encounter (Signed)
Called and spoke with pt daughter Andee Poles (she 3-way pt on the call). Pt stated he is interested in Cardiac rehab. He will come in for orientation on 09/21/21 @ 930AM and will attend the 3PM class.   Mailed letter

## 2021-08-31 NOTE — Telephone Encounter (Signed)
Pt insurance is active and benefits verified through [facility]. Co-pay $10.00, DED $0.00/$0.00 met, out of pocket $3,400.00/$0.00 met, co-insurance 0%. No pre-authorization required. Passport, 08/31/21 @ 3:47PM, IOE#[id number]

## 2021-09-20 ENCOUNTER — Telehealth (HOSPITAL_COMMUNITY): Payer: Self-pay | Admitting: *Deleted

## 2021-09-20 NOTE — Telephone Encounter (Signed)
Spoke with the patient and his daughter. Completed health history. Confirmed orientation appointment for tomorrow.Barnet Pall, RN,BSN 09/20/2021 4:58 PM

## 2021-09-21 ENCOUNTER — Other Ambulatory Visit: Payer: Self-pay

## 2021-09-21 ENCOUNTER — Encounter (HOSPITAL_COMMUNITY)
Admission: RE | Admit: 2021-09-21 | Discharge: 2021-09-21 | Disposition: A | Discharge status: Home / Self Care | Payer: Medicare HMO | Source: Ambulatory Visit | Attending: Cardiology | Admitting: Cardiology

## 2021-09-21 ENCOUNTER — Encounter (HOSPITAL_COMMUNITY): Payer: Self-pay

## 2021-09-21 VITALS — BP 122/72 | HR 95 | Ht 65.0 in | Wt 243.6 lb

## 2021-09-21 DIAGNOSIS — E119 Type 2 diabetes mellitus without complications: Secondary | ICD-10-CM | POA: Insufficient documentation

## 2021-09-21 DIAGNOSIS — Z48812 Encounter for surgical aftercare following surgery on the circulatory system: Secondary | ICD-10-CM | POA: Insufficient documentation

## 2021-09-21 DIAGNOSIS — Z951 Presence of aortocoronary bypass graft: Secondary | ICD-10-CM | POA: Insufficient documentation

## 2021-09-21 NOTE — Progress Notes (Signed)
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 Swallow whole., Disp: 90 tablet, Rfl: 3   carvedilol (COREG) 3.125 MG tablet, Take 1 tablet (3.125 mg total) by mouth 2 (two) times daily. (Patient taking differently: Take 3.125 mg by mouth in the morning.), Disp: 60 tablet, Rfl: 1   Cholecalciferol (VITAMIN D-3) 125 MCG (5000 UT) TABS, Take 5,000 Units by mouth in the morning., Disp: , Rfl:    Coenzyme Q10 (CO Q 10) 100 MG CAPS, Take 100 mg by mouth in the morning., Disp: , Rfl:    Cyanocobalamin (VITAMIN B-12 PO), Take 1 tablet by mouth in the morning., Disp: , Rfl:    furosemide (LASIX) 20 MG tablet, Take 1 tablet (20 mg total) by mouth daily. Please take 1 tablet daily, you may take an extra tablet for a weight gain of 2lbs overnight or 5 pounds in a week or if lower extremity swelling returns., Disp: 90 tablet, Rfl: 3   Grape Seed Extract 50 MG CAPS, Take 100 mg by mouth in the morning., Disp: , Rfl:    isosorbide mononitrate (IMDUR) 30 MG 24 hr tablet, Take 30 mg by mouth in the morning., Disp: , Rfl:    LEVEMIR FLEXTOUCH 100 UNIT/ML FlexPen, Inject 40 Units into the skin in the morning., Disp: , Rfl:    losartan (COZAAR) 25 MG tablet, Take 25 mg by mouth in the morning., Disp: , Rfl:    Magnesium  250 MG TABS, Take 250 mg by mouth in the morning., Disp: , Rfl:    milk thistle 175 MG tablet, Take 175 mg by mouth in the morning., Disp: , Rfl:    Multiple Vitamin (MULTIVITAMIN WITH MINERALS) TABS tablet, Take 1 tablet by mouth in the morning., Disp: , Rfl:    nitroGLYCERIN (NITROSTAT) 0.4 MG SL tablet, PLACE 1 TABLET (0.4 MG TOTAL) UNDER THE TONGUE EVERY FIVE MINUTES AS NEEDED FOR CHEST PAIN (UP TO 3 DOSES)., Disp: 25 tablet, Rfl: 0   pantoprazole (PROTONIX) 20 MG tablet, Take 20 mg by mouth in the morning., Disp: , Rfl:    potassium chloride SA (KLOR-CON M) 20 MEQ tablet, Take 20 mEq by mouth in the morning., Disp: , Rfl:    rosuvastatin (CRESTOR) 5 MG tablet, Take 5 mg by mouth daily., Disp: , Rfl:    sitaGLIPtin-metformin (JANUMET) 50-1000 MG per tablet, Take 1 tablet by mouth 2 (two) times daily with a meal., Disp: , Rfl:    Lancet Devices (ONETOUCH DELICA PLUS LANCING) MISC, , Disp: , Rfl:    Lancets (ONETOUCH DELICA PLUS [id number]) MISC, Apply 1 each topically daily., Disp: , Rfl:    ONETOUCH ULTRA test strip, , Disp: , Rfl:  No current facility-administered medications for this encounter.  Facility-Administered  Medications Ordered in Other Encounters:    fentaNYL citrate (PF) (SUBLIMAZE) injection, , Intravenous, Anesthesia Intra-op, Everlean Cherry A, CRNA, 50 mcg at 09/16/20 0756   lactated ringers infusion, , Intravenous, Continuous PRN, Colin Benton, CRNA, New Bag at 09/16/20 0750   midazolam (VERSED) 5 MG/5ML injection, , Intravenous, Anesthesia Intra-op, Everlean Cherry A, CRNA, 1 mg at 09/16/20 1610  Past Medical History: Past Medical History:  Diagnosis Date   Coronary artery calcification seen on CT scan    Diabetes mellitus ([place])    Hypertension    Patient is Jehovah's Witness    Renal insufficiency    Sleep apnea    CPAP nightly    Tobacco Use: Social History   Tobacco Use  Smoking Status Never  Smokeless Tobacco Never    Labs: Recent Review Flowsheet  Data     Labs for ITP Cardiac and Pulmonary Rehab Latest Ref Rng & Units 09/22/2020 09/22/2020 09/23/2020 09/24/2020 08/27/2021   Cholestrol 100 - 199 mg/dL - - - - -   LDLCALC 0 - 99 mg/dL - - - - -   LDLDIRECT 0 - 99 mg/dL - - - - 14   HDL >39 mg/dL - - - - -   Trlycerides 0 - 149 mg/dL - - - - -   Hemoglobin A1c 4.8 - 5.6 % - - - - -   PHART 7.350 - 7.450 7.282(L) 7.287(L) - - -   PCO2ART 32.0 - 48.0 mmHg 49.2(H) 47.9 - - -   HCO3 20.0 - 28.0 mmol/L 23.1 22.7 - - -   TCO2 22 - 32 mmol/L 25 24 - - -   ACIDBASEDEF 0.0 - 2.0 mmol/L 4.0(H) 4.0(H) - - -   O2SAT % 98.0 98.0 65.7 58.8 -       Capillary Blood Glucose: Lab Results  Component Value Date   GLUCAP 291 (H) 09/26/2020   GLUCAP 186 (H) 09/26/2020   GLUCAP 203 (H) 09/25/2020   GLUCAP 314 (H) 09/25/2020   GLUCAP 225 (H) 09/25/2020     Exercise Target Goals: Exercise Program Goal: Individual exercise prescription set using results from initial 6 min walk test and THRR while considering  patients activity barriers and safety.   Exercise Prescription Goal: Starting with aerobic activity 30 plus minutes a day, 3 days per week for initial exercise prescription. Provide home exercise prescription and guidelines that participant acknowledges understanding prior to discharge.  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 . This is new for him and may be from wearing new pair of shoes.    Cardiac Risk Stratification High             6 Minute Walk:  6 Minute Walk     Row Name 09/21/21 1044         6 Minute Walk   Phase Initial     Distance 1027 feet     Walk Time 6 minutes     # of Rest Breaks 0     MPH 1.94     METS 1.74     RPE 11     Perceived Dyspnea  1     VO2 Peak 6.08      Symptoms Yes (comment)     Comments Patient c/o  fatigue and mild shortness of breath.     Resting HR 95 bpm     Resting BP 122/72     Resting Oxygen Saturation  98 %     Exercise Oxygen Saturation  during 6 min walk 96 %     Max Ex. HR 109 bpm     Max Ex. BP 148/68     2 Minute Post BP 118/62              Oxygen Initial Assessment:   Oxygen Re-Evaluation:   Oxygen Discharge (Final Oxygen Re-Evaluation):   Initial Exercise Prescription:  Initial Exercise Prescription - 09/21/21 1400       Date of Initial Exercise RX and Referring Provider   Date 09/21/21    Referring Provider Sueanne Margarita, MD    Expected Discharge Date 11/19/21      NuStep   Level 2    SPM 85    Minutes 15    METs 1.7      Track   Laps 10    Minutes 15    METs 2.16      Prescription Details   Frequency (times per week) 3    Duration Progress to 30 minutes of continuous aerobic without signs/symptoms of physical distress      Intensity   THRR 40-80% of Max Heartrate 59-118    Ratings of Perceived Exertion 11-13    Perceived Dyspnea 0-4      Progression   Progression Continue to progress workloads to maintain intensity without signs/symptoms of physical distress.      Resistance Training   Training Prescription Yes    Weight 3 lbs    Reps 10-15             Perform Capillary Blood Glucose checks as needed.  Exercise Prescription Changes:   Exercise Comments:   Exercise Goals and Review:   Exercise Goals     Row Name 09/21/21 0930             Exercise Goals   Increase Physical Activity Yes       Intervention Provide advice, education, support and counseling about physical activity/exercise needs.;Develop an individualized exercise prescription for aerobic and resistive training based on initial evaluation findings, risk stratification, comorbidities and participant's personal goals.       Expected Outcomes Short Term: Attend rehab on a regular basis to increase  amount of physical activity.;Long Term: Exercising regularly at least 3-5 days a week.;Long Term: Add in home exercise to make exercise part of routine and to increase amount of physical activity.       Increase Strength and Stamina Yes       Intervention Provide advice, education, support and counseling about physical activity/exercise needs.;Develop an individualized exercise prescription for aerobic and resistive training based on initial evaluation findings, risk stratification, comorbidities and participant's personal goals.       Expected Outcomes Short Term: Increase workloads from initial exercise prescription for resistance, speed, and METs.;Short Term: Perform resistance training exercises routinely during rehab and add in resistance training at home;Long Term: Improve cardiorespiratory fitness, muscular endurance and strength as measured by increased METs and functional capacity (6MWT)       Able to understand and use rate of perceived exertion (RPE) scale Yes       Intervention Provide education and explanation on how to use RPE scale       Expected Outcomes Short Term: Able to use RPE daily in rehab to express  subjective intensity level;Long Term:  Able to use RPE to guide intensity level when exercising independently       Knowledge and understanding of Target Heart Rate Range (THRR) Yes       Intervention Provide education and explanation of THRR including how the numbers were predicted and where they are located for reference       Expected Outcomes Short Term: Able to state/look up THRR;Long Term: Able to use THRR to govern intensity when exercising independently;Short Term: Able to use daily as guideline for intensity in rehab       Able to check pulse independently Yes       Intervention Provide education and demonstration on how to check pulse in carotid and radial arteries.;Review the importance of being able to check your own pulse for safety during independent exercise        Expected Outcomes Short Term: Able to explain why pulse checking is important during independent exercise;Long Term: Able to check pulse independently and accurately       Understanding of Exercise Prescription Yes       Intervention Provide education, explanation, and written materials on patient's individual exercise prescription       Expected Outcomes Short Term: Able to explain program exercise prescription;Long Term: Able to explain home exercise prescription to exercise independently                Exercise Goals Re-Evaluation :    Discharge Exercise Prescription (Final Exercise Prescription Changes):   Nutrition:  Target Goals: Understanding of nutrition guidelines, daily intake of sodium 1500mg , cholesterol 200mg , calories 30% from fat and 7% or less from saturated fats, daily to have 5 or more servings of fruits and vegetables.  Biometrics:  Pre Biometrics - 09/21/21 0929       Pre Biometrics   Waist Circumference 47 inches    Hip Circumference 45 inches    Waist to Hip Ratio 1.04 %    Triceps Skinfold 27 mm    % Body Fat 38.9 %    Grip Strength 32 kg    Flexibility 0 in    Single Leg Stand 1.31 seconds              Nutrition Therapy Plan and Nutrition Goals:   Nutrition Assessments:  MEDIFICTS Score Key: ?70 Need to make dietary changes  40-70 Heart Healthy Diet ? 40 Therapeutic Level Cholesterol Diet   Picture Your Plate Scores: <35 Unhealthy dietary pattern with much room for improvement. 41-50 Dietary pattern unlikely to meet recommendations for good health and room for improvement. 51-60 More healthful dietary pattern, with some room for improvement.  >60 Healthy dietary pattern, although there may be some specific behaviors that could be improved.    Nutrition Goals Re-Evaluation:   Nutrition Goals Discharge (Final Nutrition Goals Re-Evaluation):   Psychosocial: Target Goals: Acknowledge presence or absence of significant depression  and/or stress, maximize coping skills, provide positive support system. Participant is able to verbalize types and ability to use techniques and skills needed for reducing stress and depression.  Initial Review & Psychosocial Screening:  Initial Psych Review & Screening - 09/21/21 1611       Initial Review   Current issues with None Identified      Family Dynamics   Good Support System? Yes   Mr Isidore lives with his sister and has his daughter for support     Barriers   Psychosocial barriers to participate in program There are no identifiable barriers  or psychosocial needs.      Screening Interventions   Interventions Encouraged to exercise             Quality of Life Scores:  Quality of Life - 09/21/21 1507       Quality of Life   Select Quality of Life      Quality of Life Scores   Health/Function Pre 21.97 %    Socioeconomic Pre 28.33 %    Psych/Spiritual Pre 30 %    Family Pre 30 %    GLOBAL Pre 25.92 %            Scores of 19 and below usually indicate a poorer quality of life in these areas.  A difference of  2-3 points is a clinically meaningful difference.  A difference of 2-3 points in the total score of the Quality of Life Index has been associated with significant improvement in overall quality of life, self-image, physical symptoms, and general health in studies assessing change in quality of life.  PHQ-9: Recent Review Flowsheet Data     Depression screen [facility] 2/9 09/21/2021   Decreased Interest 0   Down, Depressed, Hopeless 0   PHQ - 2 Score 0      Interpretation of Total Score  Total Score Depression Severity:  1-4 = Minimal depression, 5-9 = Mild depression, 10-14 = Moderate depression, 15-19 = Moderately severe depression, 20-27 = Severe depression   Psychosocial Evaluation and Intervention:   Psychosocial Re-Evaluation:   Psychosocial Discharge (Final Psychosocial Re-Evaluation):   Vocational Rehabilitation: Provide vocational rehab  assistance to qualifying candidates.   Vocational Rehab Evaluation & Intervention:  Vocational Rehab - 09/21/21 1612       Initial Vocational Rehab Evaluation & Intervention   Assessment shows need for Vocational Rehabilitation No   Mr Veals is retired and does not need vocationa rehab at this time            Education: Education Goals: Education classes will be provided on a weekly basis, covering required topics. Participant will state understanding/return demonstration of topics presented.  Learning Barriers/Preferences:   Education Topics: Hypertension, Hypertension Reduction -Define heart disease and high blood pressure. Discus how high blood pressure affects the body and ways to reduce high blood pressure.   Exercise and Your Heart -Discuss why it is important to exercise, the FITT principles of exercise, normal and abnormal responses to exercise, and how to exercise safely.   Angina -Discuss definition of angina, causes of angina, treatment of angina, and how to decrease risk of having angina.   Cardiac Medications -Review what the following cardiac medications are used for, how they affect the body, and side effects that may occur when taking the medications.  Medications include Aspirin, Beta blockers, calcium channel blockers, ACE Inhibitors, angiotensin receptor blockers, diuretics, digoxin, and antihyperlipidemics.   Congestive Heart Failure -Discuss the definition of CHF, how to live with CHF, the signs and symptoms of CHF, and how keep track of weight and sodium intake.   Heart Disease and Intimacy -Discus the effect sexual activity has on the heart, how changes occur during intimacy as we age, and safety during sexual activity.   Smoking Cessation / COPD -Discuss different methods to quit smoking, the health benefits of quitting smoking, and the definition of COPD.   Nutrition I: Fats -Discuss the types of cholesterol, what cholesterol does to the  heart, and how cholesterol levels can be controlled.   Nutrition II: Labels -Discuss the different components of  food labels and how to read food label   Heart Parts/Heart Disease and PAD -Discuss the anatomy of the heart, the pathway of blood circulation through the heart, and these are affected by heart disease.   Stress I: Signs and Symptoms -Discuss the causes of stress, how stress may lead to anxiety and depression, and ways to limit stress.   Stress II: Relaxation -Discuss different types of relaxation techniques to limit stress.   Warning Signs of Stroke / TIA -Discuss definition of a stroke, what the signs and symptoms are of a stroke, and how to identify when someone is having stroke.   Knowledge Questionnaire Score:  Knowledge Questionnaire Score - 09/21/21 1507       Knowledge Questionnaire Score   Pre Score 22/24             Core Components/Risk Factors/Patient Goals at Admission:  Personal Goals and Risk Factors at Admission - 09/21/21 1015       Core Components/Risk Factors/Patient Goals on Admission    Weight Management Yes;Obesity;Weight Loss    Intervention Weight Management/Obesity: Establish reasonable short term and long term weight goals.;Obesity: Provide education and appropriate resources to help participant work on and attain dietary goals.;Weight Management: Provide education and appropriate resources to help participant work on and attain dietary goals.    Admit Weight 243 lb 9.7 oz (110.5 kg)    Goal Weight: Short Term 237 lb (107.5 kg)    Goal Weight: Long Term 185 lb (83.9 kg)    Expected Outcomes Short Term: Continue to assess and modify interventions until short term weight is achieved;Long Term: Adherence to nutrition and physical activity/exercise program aimed toward attainment of established weight goal;Weight Loss: Understanding of general recommendations for a balanced deficit meal plan, which promotes 1-2 lb weight loss per week and  includes a negative energy balance of [phone] kcal/d    Diabetes Yes    Intervention Provide education about signs/symptoms and action to take for hypo/hyperglycemia.;Provide education about proper nutrition, including hydration, and aerobic/resistive exercise prescription along with prescribed medications to achieve blood glucose in normal ranges: Fasting glucose 65-99 mg/dL    Expected Outcomes Short Term: Participant verbalizes understanding of the signs/symptoms and immediate care of hyper/hypoglycemia, proper foot care and importance of medication, aerobic/resistive exercise and nutrition plan for blood glucose control.;Long Term: Attainment of HbA1C < 7%.    Hypertension Yes    Intervention Provide education on lifestyle modifcations including regular physical activity/exercise, weight management, moderate sodium restriction and increased consumption of fresh fruit, vegetables, and low fat dairy, alcohol moderation, and smoking cessation.;Monitor prescription use compliance.    Expected Outcomes Short Term: Continued assessment and intervention until BP is < 140/43mm HG in hypertensive participants. < 130/8mm HG in hypertensive participants with diabetes, heart failure or chronic kidney disease.;Long Term: Maintenance of blood pressure at goal levels.             Core Components/Risk Factors/Patient Goals Review:    Core Components/Risk Factors/Patient Goals at Discharge (Final Review):    ITP Comments:  ITP Comments     Row Name 09/21/21 0930           ITP Comments Medical Director- Dr. Fransico Him, MD                Comments:Patient attended orientation on 09/21/2021 to review rules and guidelines for program.  Completed 6 minute walk test, Intitial ITP, and exercise prescription.  VSS. Telemetry-Sinus Rhythm first degree heart block, bundle branch block occasional  couplet PVC's.. Mr Woolum did report some fatigue and mild shortness of breath as he is somewhat  deconditioned otherwise asymptomatic. Safety measures and social distancing in place per CDC guidelines. Harrell Gave RN BSN

## 2021-09-21 NOTE — Progress Notes (Signed)
Occasional couplet, PVC's noted during 6 minute walk test. Patient asymptomatic. Vital sings stable.Will fax ECG tracings  to Dr. Theodosia Blender office for review with today's ECG tracings.Barnet Pall, RN,BSN 09/21/2021 3:52 PM

## 2021-09-21 NOTE — Progress Notes (Signed)
Cardiac Rehab Medication Review by a Nurse  Does the patient  feel that his/her medications are working for him/her?  YES   Has the patient been experiencing any side effects to the medications prescribed?   NO  Does the patient measure his/her own blood pressure or blood glucose at home?  YES   Does the patient have any problems obtaining medications due to transportation or finances?    NO  Understanding of regimen: fair Understanding of indications: fair Potential of compliance: fair    Nurse  comments: Upon review of Juan Hatfield's medications it was noted that he is only taking his coreg once a day instead of twice a day. Will notify Dr Theodosia Blender office. Juan Quesinberry checks his CBG's once a day but does not check his blood pressures on a daily basis. Will have Juan Rodgers bring his medication bottle in on Monday to verify doses.Barnet Pall, RN,BSN 09/21/2021 4:09 PM     Christa See Sidnie Swalley 09/21/2021 4:02 PM

## 2021-09-22 ENCOUNTER — Telehealth: Payer: Self-pay

## 2021-09-22 ENCOUNTER — Telehealth (HOSPITAL_COMMUNITY): Payer: Self-pay | Admitting: *Deleted

## 2021-09-22 NOTE — Telephone Encounter (Signed)
Spoke with the patient and advised him that he needs to take his carvedilol twice daily. Patient verbalized understanding.

## 2021-09-22 NOTE — Telephone Encounter (Signed)
-----   Message from Sueanne Margarita, MD sent at 09/22/2021  8:01 AM EST ----- Regarding: RE: Occasional PVC's couplets He needs to be taking carvedilol BID ----- Message ----- From: Magda Kiel, RN Sent: 09/21/2021   4:34 PM EST To: Sueanne Margarita, MD, Antonieta Iba, RN Subject: Occasional PVC's couplets                      Good afternoon Dr Radford Pax,  Juan Hatfield attended cardiac rehab orientation this morning he completed his 6 minute walk test. Juan Hatfield's vital signs were stable. Patient was noted to have an occasional couplet and PVC. I faxed his ECG tracings over for your review.  Juan Hatfield is only taking his Coreg once a day. It appears to be a misunderstanding about the dosage. I just called the patient and his daughter. Please advise about the dose.  I asked him to bring his bottles in on Monday so that we can review and make sure he is taking his medications as prescribed.  Thanks for your input!  Sincerely, [facility]  Cardiac Rehab  Please let me know if have any concerns.

## 2021-09-22 NOTE — Telephone Encounter (Signed)
-----   Message from Sueanne Margarita, MD sent at 09/22/2021  8:01 AM EST ----- Regarding: RE: Occasional PVC's couplets He needs to be taking carvedilol BID ----- Message ----- From: Magda Kiel, RN Sent: 09/21/2021   4:34 PM EST To: Sueanne Margarita, MD, Antonieta Iba, RN Subject: Occasional PVC's couplets                      Good afternoon Dr Radford Pax,  Mr Fabiano attended cardiac rehab orientation this morning he completed his 6 minute walk test. Mr Kohl's vital signs were stable. Patient was noted to have an occasional couplet and PVC. I faxed his ECG tracings over for your review.  Mr Huish is only taking his Coreg once a day. It appears to be a misunderstanding about the dosage. I just called the patient and his daughter. Please advise about the dose.  I asked him to bring his bottles in on Monday so that we can review and make sure he is taking his medications as prescribed.  Thanks for your input!  Sincerely, [facility]  Cardiac Rehab  Please let me know if have any concerns.

## 2021-09-27 ENCOUNTER — Encounter (HOSPITAL_COMMUNITY)
Admission: RE | Admit: 2021-09-27 | Discharge: 2021-09-27 | Disposition: A | Discharge status: Home / Self Care | Payer: Medicare HMO | Source: Ambulatory Visit | Attending: Cardiology | Admitting: Cardiology

## 2021-09-27 ENCOUNTER — Other Ambulatory Visit: Payer: Self-pay

## 2021-09-27 DIAGNOSIS — E119 Type 2 diabetes mellitus without complications: Secondary | ICD-10-CM | POA: Diagnosis not present

## 2021-09-27 DIAGNOSIS — Z951 Presence of aortocoronary bypass graft: Secondary | ICD-10-CM

## 2021-09-27 DIAGNOSIS — Z48812 Encounter for surgical aftercare following surgery on the circulatory system: Secondary | ICD-10-CM | POA: Diagnosis not present

## 2021-09-27 LAB — GLUCOSE, CAPILLARY
Glucose-Capillary: 103 mg/dL — ABNORMAL HIGH (ref 70–99)
Glucose-Capillary: 124 mg/dL — ABNORMAL HIGH (ref 70–99)
Glucose-Capillary: 149 mg/dL — ABNORMAL HIGH (ref 70–99)

## 2021-09-27 NOTE — Progress Notes (Addendum)
Daily Session Note  Patient Details  Name: Juan Hatfield MRN: [id number] Date of Birth: 02-May-1946 Referring Provider:   Flowsheet Row CARDIAC REHAB PHASE II ORIENTATION from 09/21/2021 in [place]  Referring Provider Juan Margarita, MD       Encounter Date: 09/27/2021  Check In:  Session Check In - 09/27/21 1506       Check-In   Supervising physician immediately available to respond to emergencies [facility] immediately available    Physician(s) Juan Hatfield    Location [facility]    Staff Present Juan Hatfield BS, ACSM EP-C, Exercise Physiologist;Juan Kassel, RN, Juan Pretty, MS, ACSM CEP, Exercise Physiologist;Juan Lilyan Punt, MS, ACSM-CEP, CCRP, Exercise Physiologist    Virtual Visit No    Medication changes reported     No    Fall or balance concerns reported    No    Tobacco Cessation No Change    Warm-up and Cool-down Performed as group-led instruction    Resistance Training Performed Yes    VAD Patient? No    PAD/SET Patient? No      Pain Assessment   Currently in Pain? No/denies    Pain Score 0-No pain    Multiple Pain Sites No             Capillary Blood Glucose: Results for orders placed or performed during the hospital encounter of 09/27/21 (from the past 24 hour(s))  Glucose, capillary     Status: Abnormal   Collection Time: 09/27/21  3:10 PM  Result Value Ref Range   Glucose-Capillary 124 (H) 70 - 99 mg/dL  Glucose, capillary     Status: Abnormal   Collection Time: 09/27/21  3:54 PM  Result Value Ref Range   Glucose-Capillary 149 (H) 70 - 99 mg/dL     Exercise Prescription Changes - 09/27/21 1624       Response to Exercise   Blood Pressure (Admit) 122/70    Blood Pressure (Exercise) 140/82    Blood Pressure (Exit) 122/72    Heart Rate (Admit) 88 bpm    Heart Rate (Exercise) 117 bpm    Heart Rate (Exit) 98 bpm    Rating of Perceived Exertion (Exercise) 9    Perceived Dyspnea  (Exercise) 0    Symptoms 0    Comments Pt first day of exercise in the [facility]2 program    Duration Progress to 30 minutes of  aerobic without signs/symptoms of physical distress    Intensity THRR unchanged      Progression   Progression Continue to progress workloads to maintain intensity without signs/symptoms of physical distress.    Average METs 2.15      Resistance Training   Training Prescription Yes    Weight 3 lbs    Reps 10-15    Time 10 Minutes      NuStep   Level 2    SPM 85    Minutes 15    METs 1.9      Track   Laps 12    Minutes 15    METs 2.14             Social History   Tobacco Use  Smoking Status Never  Smokeless Tobacco Never    Goals Met:  Exercise tolerated well No report of concerns or symptoms today Strength training completed today  Goals Unmet:  Not Applicable  Comments: Juan Hatfield started cardiac rehab today.  Pt tolerated light exercise without difficulty. VSS, telemetry-Sinus  Rhythm, first degree heart block, bundle branch block, asymptomatic.  Medication list reconciled. Pt denies barriers to medicaiton compliance.  PSYCHOSOCIAL ASSESSMENT:  PHQ-0. Pt exhibits positive coping skills, hopeful outlook with supportive family. No psychosocial needs identified at this time, no psychosocial interventions necessary.    Pt enjoys wood working.   Pt oriented to exercise equipment and routine.    Understanding verbalized.  Pre exercise CBG was 104. Pt reported eating breakfast 2 hours before coming to exercise. Reminded Juan Hatfield to eat a snack if needed at least an hour before coming to exercise at cardiac rehab. Patient was given a peanut butter graham crackers and ginger ale. Recheck CBg 124. Will continue to monitor the patient throughout  the program. Juan Hatfield 09/27/2021 5:23 PM    Juan Hatfield is Medical Director for Cardiac Rehab at [facility].

## 2021-09-27 NOTE — Progress Notes (Signed)
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 (Patient taking differently: Take 3.125 mg by mouth in the morning and at bedtime.), Disp: 60 tablet, Rfl: 1   Ascorbic Acid (VITAMIN C) 100 MG tablet, Take 200 mg by mouth in the morning and at bedtime., Disp: , Rfl:    aspirin EC 81 MG tablet, Take 1 tablet (81 mg total) by mouth daily. Swallow whole., Disp: 90 tablet, Rfl: 3   Cholecalciferol (VITAMIN D-3) 125 MCG (5000 UT) TABS, Take 5,000 Units by mouth in the morning., Disp: , Rfl:    Coenzyme Q10 (CO Q 10) 100 MG CAPS, Take 100 mg by mouth in the morning., Disp: , Rfl:    Cyanocobalamin (VITAMIN B-12 PO), Take 1 tablet by mouth in the morning., Disp: , Rfl:    furosemide (LASIX) 20 MG tablet, Take 1 tablet (20 mg total) by mouth daily. Please take 1 tablet daily, you may take an extra tablet for a weight gain of 2lbs overnight or 5 pounds in a week or if lower extremity swelling returns., Disp: 90 tablet, Rfl: 3   Grape Seed Extract 50 MG CAPS, Take 100 mg by mouth in the morning., Disp: , Rfl:    isosorbide mononitrate (IMDUR) 30 MG 24 hr tablet, Take 30 mg by mouth in the morning., Disp: , Rfl:    Lancet Devices (ONETOUCH DELICA PLUS LANCING) MISC, , Disp: , Rfl:    Lancets (ONETOUCH DELICA PLUS [id number]) MISC, Apply 1 each topically daily., Disp: , Rfl:    LEVEMIR FLEXTOUCH  100 UNIT/ML FlexPen, Inject 40 Units into the skin in the morning., Disp: , Rfl:    losartan (COZAAR) 25 MG tablet, Take 25 mg by mouth in the morning., Disp: , Rfl:    Magnesium 250 MG TABS, Take 250 mg by mouth in the morning., Disp: , Rfl:    milk thistle 175 MG tablet, Take 175 mg by mouth in the morning., Disp: , Rfl:    Multiple Vitamin (MULTIVITAMIN WITH MINERALS) TABS tablet, Take 1 tablet by mouth in the morning., Disp: , Rfl:    nitroGLYCERIN (NITROSTAT) 0.4 MG SL tablet, PLACE 1 TABLET (0.4 MG TOTAL) UNDER THE TONGUE EVERY FIVE MINUTES AS NEEDED FOR CHEST PAIN (UP TO 3 DOSES)., Disp: 25 tablet, Rfl: 0   ONETOUCH ULTRA test strip, , Disp: , Rfl:    pantoprazole (PROTONIX) 20 MG tablet, Take 20 mg by mouth in the morning., Disp: , Rfl:    potassium chloride SA (KLOR-CON M) 20 MEQ tablet, Take 20 mEq by mouth in the morning., Disp: , Rfl:    rosuvastatin (CRESTOR) 5 MG tablet, Take 5 mg by mouth daily., Disp: , Rfl:    sitaGLIPtin-metformin (JANUMET) 50-1000 MG per tablet, Take 1 tablet by mouth 2 (two) times daily with a meal., Disp: , Rfl:  No current facility-administered medications for this  encounter.  Facility-Administered Medications Ordered in Other Encounters:    fentaNYL citrate (PF) (SUBLIMAZE) injection, , Intravenous, Anesthesia Intra-op, Everlean Cherry A, CRNA, 50 mcg at 09/16/20 0756   lactated ringers infusion, , Intravenous, Continuous PRN, Colin Benton, CRNA, New Bag at 09/16/20 0750   midazolam (VERSED) 5 MG/5ML injection, , Intravenous, Anesthesia Intra-op, Everlean Cherry A, CRNA, 1 mg at 09/16/20 4132  Past Medical History: Past Medical History:  Diagnosis Date   Coronary artery calcification seen on CT scan    Diabetes mellitus ([place])    Hypertension    Patient is Jehovah's Witness    Renal insufficiency    Sleep apnea    CPAP nightly    Tobacco Use: Social History   Tobacco Use  Smoking Status Never  Smokeless Tobacco Never    Labs: Recent  Review Flowsheet Data     Labs for ITP Cardiac and Pulmonary Rehab Latest Ref Rng & Units 09/22/2020 09/22/2020 09/23/2020 09/24/2020 08/27/2021   Cholestrol 100 - 199 mg/dL - - - - -   LDLCALC 0 - 99 mg/dL - - - - -   LDLDIRECT 0 - 99 mg/dL - - - - 14   HDL >39 mg/dL - - - - -   Trlycerides 0 - 149 mg/dL - - - - -   Hemoglobin A1c 4.8 - 5.6 % - - - - -   PHART 7.350 - 7.450 7.282(L) 7.287(L) - - -   PCO2ART 32.0 - 48.0 mmHg 49.2(H) 47.9 - - -   HCO3 20.0 - 28.0 mmol/L 23.1 22.7 - - -   TCO2 22 - 32 mmol/L 25 24 - - -   ACIDBASEDEF 0.0 - 2.0 mmol/L 4.0(H) 4.0(H) - - -   O2SAT % 98.0 98.0 65.7 58.8 -       Capillary Blood Glucose: Lab Results  Component Value Date   GLUCAP 149 (H) 09/27/2021   GLUCAP 124 (H) 09/27/2021   GLUCAP 103 (H) 09/27/2021   GLUCAP 291 (H) 09/26/2020   GLUCAP 186 (H) 09/26/2020     Exercise Target Goals: Exercise Program Goal: Individual exercise prescription set using results from initial 6 min walk test and THRR while considering  patients activity barriers and safety.   Exercise Prescription Goal: Starting with aerobic activity 30 plus minutes a day, 3 days per week for initial exercise prescription. Provide home exercise prescription and guidelines that participant acknowledges understanding prior to discharge.  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 . This is new for him and may be from wearing new pair of shoes.    Cardiac Risk Stratification High             6 Minute Walk:  6 Minute Walk     Row Name 09/21/21 1044         6 Minute Walk   Phase Initial     Distance 1027 feet     Walk Time 6 minutes     # of Rest Breaks 0     MPH 1.94     METS 1.74     RPE 11     Perceived Dyspnea  1      VO2 Peak 6.08     Symptoms Yes (comment)  Comments Patient c/o fatigue and mild shortness of breath.     Resting HR 95 bpm     Resting BP 122/72     Resting Oxygen Saturation  98 %     Exercise Oxygen Saturation  during 6 min walk 96 %     Max Ex. HR 109 bpm     Max Ex. BP 148/68     2 Minute Post BP 118/62              Oxygen Initial Assessment:   Oxygen Re-Evaluation:   Oxygen Discharge (Final Oxygen Re-Evaluation):   Initial Exercise Prescription:  Initial Exercise Prescription - 09/21/21 1400       Date of Initial Exercise RX and Referring Provider   Date 09/21/21    Referring Provider Sueanne Margarita, MD    Expected Discharge Date 11/19/21      NuStep   Level 2    SPM 85    Minutes 15    METs 1.7      Track   Laps 10    Minutes 15    METs 2.16      Prescription Details   Frequency (times per week) 3    Duration Progress to 30 minutes of continuous aerobic without signs/symptoms of physical distress      Intensity   THRR 40-80% of Max Heartrate 59-118    Ratings of Perceived Exertion 11-13    Perceived Dyspnea 0-4      Progression   Progression Continue to progress workloads to maintain intensity without signs/symptoms of physical distress.      Resistance Training   Training Prescription Yes    Weight 3 lbs    Reps 10-15             Perform Capillary Blood Glucose checks as needed.  Exercise Prescription Changes:   Exercise Prescription Changes     Row Name 09/27/21 1624             Response to Exercise   Blood Pressure (Admit) 122/70       Blood Pressure (Exercise) 140/82       Blood Pressure (Exit) 122/72       Heart Rate (Admit) 88 bpm       Heart Rate (Exercise) 117 bpm       Heart Rate (Exit) 98 bpm       Rating of Perceived Exertion (Exercise) 9       Perceived Dyspnea (Exercise) 0       Symptoms 0       Comments Pt first day of exercise in the CRP2 program       Duration Progress to 30 minutes of  aerobic  without signs/symptoms of physical distress       Intensity THRR unchanged         Progression   Progression Continue to progress workloads to maintain intensity without signs/symptoms of physical distress.       Average METs 2.15         Resistance Training   Training Prescription Yes       Weight 3 lbs       Reps 10-15       Time 10 Minutes         NuStep   Level 2       SPM 85       Minutes 15       METs 1.9         Track  Laps 12       Minutes 15       METs 2.14                Exercise Comments:   Exercise Comments     Row Name 09/27/21 1629           Exercise Comments Pt first day in the CRP2 program. Pt tolerated exercise well with an average MET level of 2.15. Pt is learing his THRR, RPE and ExRx                Exercise Goals and Review:   Exercise Goals     Row Name 09/21/21 0930             Exercise Goals   Increase Physical Activity Yes       Intervention Provide advice, education, support and counseling about physical activity/exercise needs.;Develop an individualized exercise prescription for aerobic and resistive training based on initial evaluation findings, risk stratification, comorbidities and participant's personal goals.       Expected Outcomes Short Term: Attend rehab on a regular basis to increase amount of physical activity.;Long Term: Exercising regularly at least 3-5 days a week.;Long Term: Add in home exercise to make exercise part of routine and to increase amount of physical activity.       Increase Strength and Stamina Yes       Intervention Provide advice, education, support and counseling about physical activity/exercise needs.;Develop an individualized exercise prescription for aerobic and resistive training based on initial evaluation findings, risk stratification, comorbidities and participant's personal goals.       Expected Outcomes Short Term: Increase workloads from initial exercise prescription for resistance,  speed, and METs.;Short Term: Perform resistance training exercises routinely during rehab and add in resistance training at home;Long Term: Improve cardiorespiratory fitness, muscular endurance and strength as measured by increased METs and functional capacity (6MWT)       Able to understand and use rate of perceived exertion (RPE) scale Yes       Intervention Provide education and explanation on how to use RPE scale       Expected Outcomes Short Term: Able to use RPE daily in rehab to express subjective intensity level;Long Term:  Able to use RPE to guide intensity level when exercising independently       Knowledge and understanding of Target Heart Rate Range (THRR) Yes       Intervention Provide education and explanation of THRR including how the numbers were predicted and where they are located for reference       Expected Outcomes Short Term: Able to state/look up THRR;Long Term: Able to use THRR to govern intensity when exercising independently;Short Term: Able to use daily as guideline for intensity in rehab       Able to check pulse independently Yes       Intervention Provide education and demonstration on how to check pulse in carotid and radial arteries.;Review the importance of being able to check your own pulse for safety during independent exercise       Expected Outcomes Short Term: Able to explain why pulse checking is important during independent exercise;Long Term: Able to check pulse independently and accurately       Understanding of Exercise Prescription Yes       Intervention Provide education, explanation, and written materials on patient's individual exercise prescription       Expected Outcomes Short Term: Able to explain program exercise prescription;Long Term: Able to  explain home exercise prescription to exercise independently                Exercise Goals Re-Evaluation :  Exercise Goals Re-Evaluation     Row Name 09/27/21 1627             Exercise Goal  Re-Evaluation   Exercise Goals Review Increase Physical Activity;Increase Strength and Stamina;Able to understand and use rate of perceived exertion (RPE) scale;Knowledge and understanding of Target Heart Rate Range (THRR);Understanding of Exercise Prescription       Comments Pt first day in the CRP2 program. Pt tolerated exercise well with an average MET level of 2.15. Pt is learing his THRR, RPE and ExRx       Expected Outcomes Will continue to monitor pt and progress workloads as tolerated without sign or symptom                 Discharge Exercise Prescription (Final Exercise Prescription Changes):  Exercise Prescription Changes - 09/27/21 1624       Response to Exercise   Blood Pressure (Admit) 122/70    Blood Pressure (Exercise) 140/82    Blood Pressure (Exit) 122/72    Heart Rate (Admit) 88 bpm    Heart Rate (Exercise) 117 bpm    Heart Rate (Exit) 98 bpm    Rating of Perceived Exertion (Exercise) 9    Perceived Dyspnea (Exercise) 0    Symptoms 0    Comments Pt first day of exercise in the CRP2 program    Duration Progress to 30 minutes of  aerobic without signs/symptoms of physical distress    Intensity THRR unchanged      Progression   Progression Continue to progress workloads to maintain intensity without signs/symptoms of physical distress.    Average METs 2.15      Resistance Training   Training Prescription Yes    Weight 3 lbs    Reps 10-15    Time 10 Minutes      NuStep   Level 2    SPM 85    Minutes 15    METs 1.9      Track   Laps 12    Minutes 15    METs 2.14             Nutrition:  Target Goals: Understanding of nutrition guidelines, daily intake of sodium <1588m, cholesterol <2089m calories 30% from fat and 7% or less from saturated fats, daily to have 5 or more servings of fruits and vegetables.  Biometrics:  Pre Biometrics - 09/21/21 0929       Pre Biometrics   Waist Circumference 47 inches    Hip Circumference 45 inches     Waist to Hip Ratio 1.04 %    Triceps Skinfold 27 mm    % Body Fat 38.9 %    Grip Strength 32 kg    Flexibility 0 in    Single Leg Stand 1.31 seconds              Nutrition Therapy Plan and Nutrition Goals:   Nutrition Assessments:  MEDIFICTS Score Key: ?70 Need to make dietary changes  40-70 Heart Healthy Diet ? 40 Therapeutic Level Cholesterol Diet   Picture Your Plate Scores: <4<98nhealthy dietary pattern with much room for improvement. 41-50 Dietary pattern unlikely to meet recommendations for good health and room for improvement. 51-60 More healthful dietary pattern, with some room for improvement.  >60 Healthy dietary pattern, although there may be some specific behaviors  that could be improved.    Nutrition Goals Re-Evaluation:   Nutrition Goals Discharge (Final Nutrition Goals Re-Evaluation):   Psychosocial: Target Goals: Acknowledge presence or absence of significant depression and/or stress, maximize coping skills, provide positive support system. Participant is able to verbalize types and ability to use techniques and skills needed for reducing stress and depression.  Initial Review & Psychosocial Screening:  Initial Psych Review & Screening - 09/21/21 1611       Initial Review   Current issues with None Identified      Family Dynamics   Good Support System? Yes   Mr Mattioli lives with his sister and has his daughter for support     Barriers   Psychosocial barriers to participate in program There are no identifiable barriers or psychosocial needs.      Screening Interventions   Interventions Encouraged to exercise             Quality of Life Scores:  Quality of Life - 09/21/21 1507       Quality of Life   Select Quality of Life      Quality of Life Scores   Health/Function Pre 21.97 %    Socioeconomic Pre 28.33 %    Psych/Spiritual Pre 30 %    Family Pre 30 %    GLOBAL Pre 25.92 %            Scores of 19 and below usually  indicate a poorer quality of life in these areas.  A difference of  2-3 points is a clinically meaningful difference.  A difference of 2-3 points in the total score of the Quality of Life Index has been associated with significant improvement in overall quality of life, self-image, physical symptoms, and general health in studies assessing change in quality of life.  PHQ-9: Recent Review Flowsheet Data     Depression screen [facility] 2/9 09/21/2021   Decreased Interest 0   Down, Depressed, Hopeless 0   PHQ - 2 Score 0      Interpretation of Total Score  Total Score Depression Severity:  1-4 = Minimal depression, 5-9 = Mild depression, 10-14 = Moderate depression, 15-19 = Moderately severe depression, 20-27 = Severe depression   Psychosocial Evaluation and Intervention:   Psychosocial Re-Evaluation:  Psychosocial Re-Evaluation     [place] Name 09/27/21 1725             Psychosocial Re-Evaluation   Current issues with None Identified       Interventions Encouraged to attend Cardiac Rehabilitation for the exercise       Continue Psychosocial Services  No Follow up required                Psychosocial Discharge (Final Psychosocial Re-Evaluation):  Psychosocial Re-Evaluation - 09/27/21 1725       Psychosocial Re-Evaluation   Current issues with None Identified    Interventions Encouraged to attend Cardiac Rehabilitation for the exercise    Continue Psychosocial Services  No Follow up required             Vocational Rehabilitation: Provide vocational rehab assistance to qualifying candidates.   Vocational Rehab Evaluation & Intervention:  Vocational Rehab - 09/21/21 1612       Initial Vocational Rehab Evaluation & Intervention   Assessment shows need for Vocational Rehabilitation No   Mr Engelstad is retired and does not need vocationa rehab at this time            Education: Education Goals: Education  classes will be provided on a weekly basis, covering required  topics. Participant will state understanding/return demonstration of topics presented.  Learning Barriers/Preferences:   Education Topics: Hypertension, Hypertension Reduction -Define heart disease and high blood pressure. Discus how high blood pressure affects the body and ways to reduce high blood pressure.   Exercise and Your Heart -Discuss why it is important to exercise, the FITT principles of exercise, normal and abnormal responses to exercise, and how to exercise safely.   Angina -Discuss definition of angina, causes of angina, treatment of angina, and how to decrease risk of having angina.   Cardiac Medications -Review what the following cardiac medications are used for, how they affect the body, and side effects that may occur when taking the medications.  Medications include Aspirin, Beta blockers, calcium channel blockers, ACE Inhibitors, angiotensin receptor blockers, diuretics, digoxin, and antihyperlipidemics.   Congestive Heart Failure -Discuss the definition of CHF, how to live with CHF, the signs and symptoms of CHF, and how keep track of weight and sodium intake.   Heart Disease and Intimacy -Discus the effect sexual activity has on the heart, how changes occur during intimacy as we age, and safety during sexual activity.   Smoking Cessation / COPD -Discuss different methods to quit smoking, the health benefits of quitting smoking, and the definition of COPD.   Nutrition I: Fats -Discuss the types of cholesterol, what cholesterol does to the heart, and how cholesterol levels can be controlled.   Nutrition II: Labels -Discuss the different components of food labels and how to read food label   Heart Parts/Heart Disease and PAD -Discuss the anatomy of the heart, the pathway of blood circulation through the heart, and these are affected by heart disease.   Stress I: Signs and Symptoms -Discuss the causes of stress, how stress may lead to anxiety and  depression, and ways to limit stress.   Stress II: Relaxation -Discuss different types of relaxation techniques to limit stress.   Warning Signs of Stroke / TIA -Discuss definition of a stroke, what the signs and symptoms are of a stroke, and how to identify when someone is having stroke.   Knowledge Questionnaire Score:  Knowledge Questionnaire Score - 09/21/21 1507       Knowledge Questionnaire Score   Pre Score 22/24             Core Components/Risk Factors/Patient Goals at Admission:  Personal Goals and Risk Factors at Admission - 09/21/21 1015       Core Components/Risk Factors/Patient Goals on Admission    Weight Management Yes;Obesity;Weight Loss    Intervention Weight Management/Obesity: Establish reasonable short term and long term weight goals.;Obesity: Provide education and appropriate resources to help participant work on and attain dietary goals.;Weight Management: Provide education and appropriate resources to help participant work on and attain dietary goals.    Admit Weight 243 lb 9.7 oz (110.5 kg)    Goal Weight: Short Term 237 lb (107.5 kg)    Goal Weight: Long Term 185 lb (83.9 kg)    Expected Outcomes Short Term: Continue to assess and modify interventions until short term weight is achieved;Long Term: Adherence to nutrition and physical activity/exercise program aimed toward attainment of established weight goal;Weight Loss: Understanding of general recommendations for a balanced deficit meal plan, which promotes 1-2 lb weight loss per week and includes a negative energy balance of [phone] kcal/d    Diabetes Yes    Intervention Provide education about signs/symptoms and action to take for  hypo/hyperglycemia.;Provide education about proper nutrition, including hydration, and aerobic/resistive exercise prescription along with prescribed medications to achieve blood glucose in normal ranges: Fasting glucose 65-99 mg/dL    Expected Outcomes Short Term:  Participant verbalizes understanding of the signs/symptoms and immediate care of hyper/hypoglycemia, proper foot care and importance of medication, aerobic/resistive exercise and nutrition plan for blood glucose control.;Long Term: Attainment of HbA1C < 7%.    Hypertension Yes    Intervention Provide education on lifestyle modifcations including regular physical activity/exercise, weight management, moderate sodium restriction and increased consumption of fresh fruit, vegetables, and low fat dairy, alcohol moderation, and smoking cessation.;Monitor prescription use compliance.    Expected Outcomes Short Term: Continued assessment and intervention until BP is < 140/92m HG in hypertensive participants. < 130/810mHG in hypertensive participants with diabetes, heart failure or chronic kidney disease.;Long Term: Maintenance of blood pressure at goal levels.             Core Components/Risk Factors/Patient Goals Review:   Goals and Risk Factor Review     Row Name 09/28/21 1450             Core Components/Risk Factors/Patient Goals Review   Personal Goals Review Weight Management/Obesity;Hypertension;Diabetes       Review Llewelyn started exercise at cardiac reha on 09/27/20. Cebastian did well with exercise. Vital signs and CBG's were stable       Expected Outcomes Treysean will continue to participate in phase 2 cardiac rehab for exercise, nutrition and lifestyle modifications                Core Components/Risk Factors/Patient Goals at Discharge (Final Review):   Goals and Risk Factor Review - 09/28/21 1450       Core Components/Risk Factors/Patient Goals Review   Personal Goals Review Weight Management/Obesity;Hypertension;Diabetes    Review Zyquan started exercise at cardiac reha on 09/27/20. Tulio did well with exercise. Vital signs and CBG's were stable    Expected Outcomes Muaaz will continue to participate in phase 2 cardiac rehab for exercise, nutrition and lifestyle  modifications             ITP Comments:  ITP Comments     Row Name 09/21/21 0930 09/27/21 1723         ITP Comments Medical Director- Dr. TrFransico HimMD 30 Day ITP Review. Thorne started cardiac rehab on 09/27/20. Sloan did well with exercise               Comments: See ITP comments.MaBarnet PallRN,BSN 09/28/2021 2:52 PM

## 2021-10-01 ENCOUNTER — Encounter (HOSPITAL_COMMUNITY)
Admission: RE | Admit: 2021-10-01 | Discharge: 2021-10-01 | Disposition: A | Discharge status: Home / Self Care | Payer: Medicare HMO | Source: Ambulatory Visit | Attending: Cardiology | Admitting: Cardiology

## 2021-10-01 ENCOUNTER — Other Ambulatory Visit: Payer: Self-pay

## 2021-10-01 DIAGNOSIS — Z951 Presence of aortocoronary bypass graft: Secondary | ICD-10-CM

## 2021-10-01 DIAGNOSIS — Z48812 Encounter for surgical aftercare following surgery on the circulatory system: Secondary | ICD-10-CM | POA: Diagnosis not present

## 2021-10-01 LAB — GLUCOSE, CAPILLARY: Glucose-Capillary: 262 mg/dL — ABNORMAL HIGH (ref 70–99)

## 2021-10-04 ENCOUNTER — Encounter (HOSPITAL_COMMUNITY)
Admission: RE | Admit: 2021-10-04 | Discharge: 2021-10-04 | Disposition: A | Discharge status: Home / Self Care | Payer: Medicare HMO | Source: Ambulatory Visit | Attending: Cardiology | Admitting: Cardiology

## 2021-10-04 ENCOUNTER — Other Ambulatory Visit: Payer: Self-pay

## 2021-10-04 DIAGNOSIS — Z951 Presence of aortocoronary bypass graft: Secondary | ICD-10-CM

## 2021-10-04 DIAGNOSIS — Z48812 Encounter for surgical aftercare following surgery on the circulatory system: Secondary | ICD-10-CM | POA: Diagnosis not present

## 2021-10-04 LAB — GLUCOSE, CAPILLARY
Glucose-Capillary: 290 mg/dL — ABNORMAL HIGH (ref 70–99)
Glucose-Capillary: 258 mg/dL — ABNORMAL HIGH (ref 70–99)
Glucose-Capillary: 262 mg/dL — ABNORMAL HIGH (ref 70–99)

## 2021-10-08 ENCOUNTER — Other Ambulatory Visit: Payer: Self-pay

## 2021-10-08 ENCOUNTER — Encounter (HOSPITAL_COMMUNITY)
Admission: RE | Admit: 2021-10-08 | Discharge: 2021-10-08 | Disposition: A | Discharge status: Home / Self Care | Payer: Medicare HMO | Source: Ambulatory Visit | Attending: Cardiology | Admitting: Cardiology

## 2021-10-08 DIAGNOSIS — Z48812 Encounter for surgical aftercare following surgery on the circulatory system: Secondary | ICD-10-CM | POA: Diagnosis not present

## 2021-10-08 DIAGNOSIS — Z951 Presence of aortocoronary bypass graft: Secondary | ICD-10-CM

## 2021-10-11 ENCOUNTER — Other Ambulatory Visit: Payer: Self-pay

## 2021-10-11 ENCOUNTER — Encounter (HOSPITAL_COMMUNITY)
Admission: RE | Admit: 2021-10-11 | Discharge: 2021-10-11 | Disposition: A | Discharge status: Home / Self Care | Payer: Medicare HMO | Source: Ambulatory Visit | Attending: Cardiology | Admitting: Cardiology

## 2021-10-11 DIAGNOSIS — Z951 Presence of aortocoronary bypass graft: Secondary | ICD-10-CM

## 2021-10-11 DIAGNOSIS — Z48812 Encounter for surgical aftercare following surgery on the circulatory system: Secondary | ICD-10-CM | POA: Diagnosis not present

## 2021-10-11 LAB — GLUCOSE, CAPILLARY: Glucose-Capillary: 269 mg/dL — ABNORMAL HIGH (ref 70–99)

## 2021-10-15 ENCOUNTER — Encounter (HOSPITAL_COMMUNITY)
Admission: RE | Admit: 2021-10-15 | Discharge: 2021-10-15 | Disposition: A | Discharge status: Home / Self Care | Payer: Medicare HMO | Source: Ambulatory Visit | Attending: Cardiology | Admitting: Cardiology

## 2021-10-15 ENCOUNTER — Other Ambulatory Visit: Payer: Self-pay

## 2021-10-15 DIAGNOSIS — Z951 Presence of aortocoronary bypass graft: Secondary | ICD-10-CM

## 2021-10-15 NOTE — Progress Notes (Signed)
CARDIAC REHAB PHASE 2 ? ?Reviewed home exercise with pt today. Pt is tolerating exercise well. Pt will continue to exercise on his own by using his stationary bike for 30+ minutes per session 2-4 days a week in addition to the 3 days in CRP2. Advised pt on THRR, RPE scale, hydration and temperature/humidity precautions. Reinforced NTG use, S/S to stop exercise and when to call MD vs 911. Encouraged warm up cool down and stretches with exercise sessions. Pt verbalized understanding, all questions were answered and pt was given a copy to take home.  ?  ?Kirby Funk ACSM-CEP ?10/15/2021 ?4:59 PM ? ?

## 2021-10-15 NOTE — Progress Notes (Signed)
10 beat run of SVT noted. Patient asymptomatic. Noted during cool down rate 120-150. Blood pressure 122/72. Sande Rives [facility] paged and notified.Will continue to monitor the patient throughout  the program.Tyriana Helmkamp Venetia Maxon, RN,BSN ?10/15/2021 4:57 PM  ?

## 2021-10-18 ENCOUNTER — Other Ambulatory Visit: Payer: Self-pay

## 2021-10-18 ENCOUNTER — Encounter (HOSPITAL_COMMUNITY)
Admission: RE | Admit: 2021-10-18 | Discharge: 2021-10-18 | Disposition: A | Discharge status: Home / Self Care | Payer: Medicare HMO | Source: Ambulatory Visit | Attending: Cardiology | Admitting: Cardiology

## 2021-10-18 DIAGNOSIS — Z951 Presence of aortocoronary bypass graft: Secondary | ICD-10-CM | POA: Diagnosis not present

## 2021-10-22 ENCOUNTER — Encounter (HOSPITAL_COMMUNITY)
Admission: RE | Admit: 2021-10-22 | Discharge: 2021-10-22 | Disposition: A | Discharge status: Home / Self Care | Payer: Medicare HMO | Source: Ambulatory Visit | Attending: Cardiology | Admitting: Cardiology

## 2021-10-22 ENCOUNTER — Other Ambulatory Visit: Payer: Self-pay

## 2021-10-22 DIAGNOSIS — Z951 Presence of aortocoronary bypass graft: Secondary | ICD-10-CM | POA: Diagnosis not present

## 2021-10-25 ENCOUNTER — Encounter (HOSPITAL_COMMUNITY)
Admission: RE | Admit: 2021-10-25 | Discharge: 2021-10-25 | Disposition: A | Discharge status: Home / Self Care | Payer: Medicare HMO | Source: Ambulatory Visit | Attending: Cardiology | Admitting: Cardiology

## 2021-10-25 ENCOUNTER — Other Ambulatory Visit: Payer: Self-pay

## 2021-10-25 ENCOUNTER — Telehealth: Payer: Self-pay

## 2021-10-25 DIAGNOSIS — Z951 Presence of aortocoronary bypass graft: Secondary | ICD-10-CM | POA: Diagnosis not present

## 2021-10-25 NOTE — Telephone Encounter (Signed)
Spoke with the patient's daughter and advised that the patient needs to be taking carvedilol twice daily. She verbalized understanding and will let the patient know.  ?

## 2021-10-25 NOTE — Telephone Encounter (Signed)
-----   Message from Sueanne Margarita, MD sent at 10/25/2021  1:38 PM EDT ----- ?Please let patient know that he should be taking his Carvedilol twice daily ?----- Message ----- ?From: Sallee Provencal L ?Sent: 10/25/2021  12:04 PM EDT ?To: Sueanne Margarita, MD ? ? ?

## 2021-10-26 NOTE — Progress Notes (Signed)
Cardiac Individual Treatment Plan ? ?Patient Details  ?Name: Juan Hatfield ?MRN: [id number] ?Date of Birth: Jun 11, 1946 ?Referring Provider:   ?Flowsheet Row CARDIAC REHAB PHASE II ORIENTATION from 09/21/2021 in [place]  ?Referring Provider Sueanne Margarita, MD  ? ?  ? ? ?Initial Encounter Date:  ?Flowsheet Row CARDIAC REHAB PHASE II ORIENTATION from 09/21/2021 in [place]  ?Date 09/21/21  ? ?  ? ? 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 ?Patient's Home Medications on Admission: ? ?Current Outpatient Medications:  ?  Ascorbic Acid (VITAMIN C) 100 MG tablet, Take 200 mg by mouth in the morning and at bedtime., Disp: , Rfl:  ?  aspirin EC 81 MG tablet, Take 1 tablet (81 mg total) by mouth daily. Swallow whole., Disp: 90 tablet, Rfl: 3 ?  carvedilol (COREG) 3.125 MG tablet, Take 1 tablet (3.125 mg total) by mouth 2 (two) times daily. (Patient taking differently: Take 3.125 mg by mouth in the morning and at bedtime.), Disp: 60 tablet, Rfl: 1 ?  Cholecalciferol (VITAMIN D-3) 125 MCG (5000 UT) TABS, Take 5,000 Units by mouth in the morning., Disp: , Rfl:  ?  Coenzyme Q10 (CO Q 10) 100 MG CAPS, Take 100 mg by mouth in the morning., Disp: , Rfl:  ?  Cyanocobalamin (VITAMIN B-12 PO), Take 1 tablet by mouth in the morning., Disp: , Rfl:  ?  furosemide (LASIX) 20 MG tablet, Take 1 tablet (20 mg total) by mouth daily. Please take 1 tablet daily, you may take an extra tablet for a weight gain of 2lbs overnight or 5 pounds in a week or if lower extremity swelling returns., Disp: 90 tablet, Rfl: 3 ?  Grape Seed Extract 50 MG CAPS, Take 100 mg by mouth in the morning., Disp: , Rfl:  ?  isosorbide mononitrate (IMDUR) 30 MG 24 hr tablet, Take 30 mg by mouth in the morning., Disp: , Rfl:  ?  Lancet Devices (ONETOUCH DELICA PLUS LANCING) MISC, , Disp: , Rfl:  ?  Lancets (ONETOUCH DELICA PLUS [id number]) MISC, Apply 1 each topically daily., Disp: , Rfl:  ?  LEVEMIR FLEXTOUCH  100 UNIT/ML FlexPen, Inject 40 Units into the skin in the morning., Disp: , Rfl:  ?  losartan (COZAAR) 25 MG tablet, Take 25 mg by mouth in the morning., Disp: , Rfl:  ?  Magnesium 250 MG TABS, Take 250 mg by mouth in the morning., Disp: , Rfl:  ?  milk thistle 175 MG tablet, Take 175 mg by mouth in the morning., Disp: , Rfl:  ?  Multiple Vitamin (MULTIVITAMIN WITH MINERALS) TABS tablet, Take 1 tablet by mouth in the morning., Disp: , Rfl:  ?  nitroGLYCERIN (NITROSTAT) 0.4 MG SL tablet, PLACE 1 TABLET (0.4 MG TOTAL) UNDER THE TONGUE EVERY FIVE MINUTES AS NEEDED FOR CHEST PAIN (UP TO 3 DOSES)., Disp: 25 tablet, Rfl: 0 ?  ONETOUCH ULTRA test strip, , Disp: , Rfl:  ?  pantoprazole (PROTONIX) 20 MG tablet, Take 20 mg by mouth in the morning., Disp: , Rfl:  ?  potassium chloride SA (KLOR-CON M) 20 MEQ tablet, Take 20 mEq by mouth in the morning., Disp: , Rfl:  ?  rosuvastatin (CRESTOR) 5 MG tablet, Take 5 mg by mouth daily., Disp: , Rfl:  ?  sitaGLIPtin-metformin (JANUMET) 50-1000 MG per tablet, Take 1 tablet by mouth 2 (two) times daily with a meal., Disp: , Rfl:  ?No current facility-administered medications for this  encounter. ? ?Facility-Administered Medications Ordered in Other Encounters:  ?  fentaNYL citrate (PF) (SUBLIMAZE) injection, , Intravenous, Anesthesia Intra-op, Everlean Cherry A, CRNA, 50 mcg at 09/16/20 0756 ?  lactated ringers infusion, , Intravenous, Continuous PRN, Colin Benton, CRNA, New Bag at 09/16/20 [phone] ?  midazolam (VERSED) 5 MG/5ML injection, , Intravenous, Anesthesia Intra-op, Everlean Cherry A, CRNA, 1 mg at 09/16/20 5397 ? ?Past Medical History: ?Past Medical History:  ?Diagnosis Date  ? Coronary artery calcification seen on CT scan   ? Diabetes mellitus ([place])   ? Hypertension   ? Patient is Jehovah's Witness   ? Renal insufficiency   ? Sleep apnea   ? CPAP nightly  ? ? ?Tobacco Use: ?Social History  ? ?Tobacco Use  ?Smoking Status Never  ?Smokeless Tobacco Never  ? ? ?Labs: ?Recent  Review Flowsheet Data   ? ? Labs for ITP Cardiac and Pulmonary Rehab Latest Ref Rng & Units 09/22/2020 09/22/2020 09/23/2020 09/24/2020 08/27/2021  ? Cholestrol 100 - 199 mg/dL - - - - -  ? LDLCALC 0 - 99 mg/dL - - - - -  ? LDLDIRECT 0 - 99 mg/dL - - - - 14  ? HDL >39 mg/dL - - - - -  ? Trlycerides 0 - 149 mg/dL - - - - -  ? Hemoglobin A1c 4.8 - 5.6 % - - - - -  ? PHART 7.350 - 7.450 7.282(L) 7.287(L) - - -  ? PCO2ART 32.0 - 48.0 mmHg 49.2(H) 47.9 - - -  ? HCO3 20.0 - 28.0 mmol/L 23.1 22.7 - - -  ? TCO2 22 - 32 mmol/L 25 24 - - -  ? ACIDBASEDEF 0.0 - 2.0 mmol/L 4.0(H) 4.0(H) - - -  ? O2SAT % 98.0 98.0 65.7 58.8 -  ? ?  ? ? ?Capillary Blood Glucose: ?Lab Results  ?Component Value Date  ? GLUCAP 269 (H) 10/11/2021  ? GLUCAP 262 (H) 10/04/2021  ? GLUCAP 258 (H) 10/04/2021  ? GLUCAP 262 (H) 10/01/2021  ? GLUCAP 290 (H) 10/01/2021  ? ? ? ?Exercise Target Goals: ?Exercise Program Goal: ?Individual exercise prescription set using results from initial 6 min walk test and THRR while considering  patient?s activity barriers and safety.  ? ?Exercise Prescription Goal: ?Starting with aerobic activity 30 plus minutes a day, 3 days per week for initial exercise prescription. Provide home exercise prescription and guidelines that participant acknowledges understanding prior to discharge. ? ?Activity Barriers & Risk Stratification: ? Activity Barriers & Cardiac Risk Stratification - 09/21/21 1015   ? ?  ? Activity Barriers & Cardiac Risk Stratification  ? Activity Barriers Other (comment)   ? 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 . This is new for him and may be from wearing new pair of shoes.   ? Cardiac Risk Stratification High   ? ?  ?  ? ?  ? ? ?6 Minute Walk: ? 6 Minute Walk   ? ? [place] Name 09/21/21 1044  ?  ?  ?  ? 6 Minute Walk  ? Phase Initial    ? Distance 1027 feet    ? Walk Time 6 minutes    ? # of Rest Breaks 0    ? MPH 1.94    ? METS 1.74    ? RPE 11    ? Perceived Dyspnea  1    ?  VO2 Peak 6.08    ? Symptoms Yes (comment)    ?  Comments Patient c/o fatigue and mild shortness of breath.    ? Resting HR 95 bpm    ? Resting BP 122/72    ? Resting Oxygen Saturation  98 %    ? Exercise Oxygen Saturation  during 6 min walk 96 %    ? Max Ex. HR 109 bpm    ? Max Ex. BP 148/68    ? 2 Minute Post BP 118/62    ? ?  ?  ? ?  ? ? ?Oxygen Initial Assessment: ? ? ?Oxygen Re-Evaluation: ? ? ?Oxygen Discharge (Final Oxygen Re-Evaluation): ? ? ?Initial Exercise Prescription: ? Initial Exercise Prescription - 09/21/21 1400   ? ?  ? Date of Initial Exercise RX and Referring Provider  ? Date 09/21/21   ? Referring Provider Sueanne Margarita, MD   ? Expected Discharge Date 11/19/21   ?  ? NuStep  ? Level 2   ? SPM 85   ? Minutes 15   ? METs 1.7   ?  ? Track  ? Laps 10   ? Minutes 15   ? METs 2.16   ?  ? Prescription Details  ? Frequency (times per week) 3   ? Duration Progress to 30 minutes of continuous aerobic without signs/symptoms of physical distress   ?  ? Intensity  ? THRR 40-80% of Max Heartrate 59-118   ? Ratings of Perceived Exertion 11-13   ? Perceived Dyspnea 0-4   ?  ? Progression  ? Progression Continue to progress workloads to maintain intensity without signs/symptoms of physical distress.   ?  ? Resistance Training  ? Training Prescription Yes   ? Weight 3 lbs   ? Reps 10-15   ? ?  ?  ? ?  ? ? ?Perform Capillary Blood Glucose checks as needed. ? ?Exercise Prescription Changes: ? ? Exercise Prescription Changes   ? ? [place] Name 09/27/21 1624 10/15/21 1708  ?  ?  ?  ?  ? Response to Exercise  ? Blood Pressure (Admit) 122/70 124/70     ? Blood Pressure (Exercise) 140/82 140/68     ? Blood Pressure (Exit) 122/72 122/72     ? Heart Rate (Admit) 88 bpm 88 bpm     ? Heart Rate (Exercise) 117 bpm 109 bpm     ? Heart Rate (Exit) 98 bpm 87 bpm     ? Rating of Perceived Exertion (Exercise) 9 11     ? Perceived Dyspnea (Exercise) 0 0     ? Symptoms 0 0     ? Comments Pt first day of exercise in the CRP2 program  Reviewed MET's goals and home ExRx     ? Duration Progress to 30 minutes of  aerobic without signs/symptoms of physical distress Progress to 30 minutes of  aerobic without signs/symptoms of physical distres

## 2021-10-29 ENCOUNTER — Other Ambulatory Visit: Payer: Self-pay

## 2021-10-29 ENCOUNTER — Encounter (HOSPITAL_COMMUNITY)
Admission: RE | Admit: 2021-10-29 | Discharge: 2021-10-29 | Disposition: A | Discharge status: Home / Self Care | Payer: Medicare HMO | Source: Ambulatory Visit | Attending: Cardiology | Admitting: Cardiology

## 2021-10-29 DIAGNOSIS — Z951 Presence of aortocoronary bypass graft: Secondary | ICD-10-CM

## 2021-11-01 ENCOUNTER — Encounter (HOSPITAL_COMMUNITY)
Admission: RE | Admit: 2021-11-01 | Discharge: 2021-11-01 | Disposition: A | Discharge status: Home / Self Care | Payer: Medicare HMO | Source: Ambulatory Visit | Attending: Cardiology | Admitting: Cardiology

## 2021-11-01 ENCOUNTER — Other Ambulatory Visit: Payer: Self-pay

## 2021-11-01 DIAGNOSIS — Z951 Presence of aortocoronary bypass graft: Secondary | ICD-10-CM | POA: Diagnosis not present

## 2021-11-05 ENCOUNTER — Other Ambulatory Visit: Payer: Self-pay

## 2021-11-05 ENCOUNTER — Encounter (HOSPITAL_COMMUNITY)
Admission: RE | Admit: 2021-11-05 | Discharge: 2021-11-05 | Disposition: A | Discharge status: Home / Self Care | Payer: Medicare HMO | Source: Ambulatory Visit | Attending: Cardiology | Admitting: Cardiology

## 2021-11-05 DIAGNOSIS — Z951 Presence of aortocoronary bypass graft: Secondary | ICD-10-CM | POA: Diagnosis not present

## 2021-11-08 ENCOUNTER — Encounter (HOSPITAL_COMMUNITY)
Admission: RE | Admit: 2021-11-08 | Discharge: 2021-11-08 | Disposition: A | Discharge status: Home / Self Care | Payer: Medicare HMO | Source: Ambulatory Visit | Attending: Cardiology | Admitting: Cardiology

## 2021-11-08 ENCOUNTER — Other Ambulatory Visit: Payer: Self-pay

## 2021-11-08 DIAGNOSIS — Z951 Presence of aortocoronary bypass graft: Secondary | ICD-10-CM | POA: Diagnosis not present

## 2021-11-12 ENCOUNTER — Encounter (HOSPITAL_COMMUNITY)
Admission: RE | Admit: 2021-11-12 | Discharge: 2021-11-12 | Disposition: A | Discharge status: Home / Self Care | Payer: Medicare HMO | Source: Ambulatory Visit | Attending: Cardiology | Admitting: Cardiology

## 2021-11-12 DIAGNOSIS — Z951 Presence of aortocoronary bypass graft: Secondary | ICD-10-CM | POA: Diagnosis not present

## 2021-11-15 ENCOUNTER — Encounter (HOSPITAL_COMMUNITY)
Admission: RE | Admit: 2021-11-15 | Discharge: 2021-11-15 | Disposition: A | Discharge status: Home / Self Care | Payer: Medicare HMO | Source: Ambulatory Visit | Attending: Cardiology | Admitting: Cardiology

## 2021-11-15 VITALS — Ht 65.0 in | Wt 243.8 lb

## 2021-11-15 DIAGNOSIS — Z951 Presence of aortocoronary bypass graft: Secondary | ICD-10-CM | POA: Diagnosis present

## 2021-11-15 DIAGNOSIS — Z4889 Encounter for other specified surgical aftercare: Secondary | ICD-10-CM | POA: Insufficient documentation

## 2021-11-19 ENCOUNTER — Telehealth (HOSPITAL_COMMUNITY): Payer: Self-pay | Admitting: *Deleted

## 2021-11-19 ENCOUNTER — Encounter (HOSPITAL_COMMUNITY): Payer: Medicare HMO

## 2021-11-19 NOTE — Telephone Encounter (Signed)
Spoke with the patient's daughter Leemon plans to return to exercise on Monday for his last day.Barnet Pall, RN,BSN ?11/19/2021 10:05 AM  ?

## 2021-11-22 ENCOUNTER — Encounter (HOSPITAL_COMMUNITY)
Admission: RE | Admit: 2021-11-22 | Discharge: 2021-11-22 | Disposition: A | Discharge status: Home / Self Care | Payer: Medicare HMO | Source: Ambulatory Visit | Attending: Cardiology | Admitting: Cardiology

## 2021-11-22 DIAGNOSIS — Z4889 Encounter for other specified surgical aftercare: Secondary | ICD-10-CM | POA: Diagnosis not present

## 2021-11-22 DIAGNOSIS — Z951 Presence of aortocoronary bypass graft: Secondary | ICD-10-CM

## 2021-11-24 NOTE — Progress Notes (Signed)
Cardiac Individual Treatment Plan ? ?Patient Details  ?Name: Juan Hatfield ?MRN: [id number] ?Date of Birth: Jun 11, 1946 ?Referring Provider:   ?Flowsheet Row CARDIAC REHAB PHASE II ORIENTATION from 09/21/2021 in [place]  ?Referring Provider Sueanne Margarita, MD  ? ?  ? ? ?Initial Encounter Date:  ?Flowsheet Row CARDIAC REHAB PHASE II ORIENTATION from 09/21/2021 in [place]  ?Date 09/21/21  ? ?  ? ? 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 ?Patient's Home Medications on Admission: ? ?Current Outpatient Medications:  ?  Ascorbic Acid (VITAMIN C) 100 MG tablet, Take 200 mg by mouth in the morning and at bedtime., Disp: , Rfl:  ?  aspirin EC 81 MG tablet, Take 1 tablet (81 mg total) by mouth daily. Swallow whole., Disp: 90 tablet, Rfl: 3 ?  carvedilol (COREG) 3.125 MG tablet, Take 1 tablet (3.125 mg total) by mouth 2 (two) times daily. (Patient taking differently: Take 3.125 mg by mouth in the morning and at bedtime.), Disp: 60 tablet, Rfl: 1 ?  Cholecalciferol (VITAMIN D-3) 125 MCG (5000 UT) TABS, Take 5,000 Units by mouth in the morning., Disp: , Rfl:  ?  Coenzyme Q10 (CO Q 10) 100 MG CAPS, Take 100 mg by mouth in the morning., Disp: , Rfl:  ?  Cyanocobalamin (VITAMIN B-12 PO), Take 1 tablet by mouth in the morning., Disp: , Rfl:  ?  furosemide (LASIX) 20 MG tablet, Take 1 tablet (20 mg total) by mouth daily. Please take 1 tablet daily, you may take an extra tablet for a weight gain of 2lbs overnight or 5 pounds in a week or if lower extremity swelling returns., Disp: 90 tablet, Rfl: 3 ?  Grape Seed Extract 50 MG CAPS, Take 100 mg by mouth in the morning., Disp: , Rfl:  ?  isosorbide mononitrate (IMDUR) 30 MG 24 hr tablet, Take 30 mg by mouth in the morning., Disp: , Rfl:  ?  Lancet Devices (ONETOUCH DELICA PLUS LANCING) MISC, , Disp: , Rfl:  ?  Lancets (ONETOUCH DELICA PLUS [id number]) MISC, Apply 1 each topically daily., Disp: , Rfl:  ?  LEVEMIR FLEXTOUCH  100 UNIT/ML FlexPen, Inject 40 Units into the skin in the morning., Disp: , Rfl:  ?  losartan (COZAAR) 25 MG tablet, Take 25 mg by mouth in the morning., Disp: , Rfl:  ?  Magnesium 250 MG TABS, Take 250 mg by mouth in the morning., Disp: , Rfl:  ?  milk thistle 175 MG tablet, Take 175 mg by mouth in the morning., Disp: , Rfl:  ?  Multiple Vitamin (MULTIVITAMIN WITH MINERALS) TABS tablet, Take 1 tablet by mouth in the morning., Disp: , Rfl:  ?  nitroGLYCERIN (NITROSTAT) 0.4 MG SL tablet, PLACE 1 TABLET (0.4 MG TOTAL) UNDER THE TONGUE EVERY FIVE MINUTES AS NEEDED FOR CHEST PAIN (UP TO 3 DOSES)., Disp: 25 tablet, Rfl: 0 ?  ONETOUCH ULTRA test strip, , Disp: , Rfl:  ?  pantoprazole (PROTONIX) 20 MG tablet, Take 20 mg by mouth in the morning., Disp: , Rfl:  ?  potassium chloride SA (KLOR-CON M) 20 MEQ tablet, Take 20 mEq by mouth in the morning., Disp: , Rfl:  ?  rosuvastatin (CRESTOR) 5 MG tablet, Take 5 mg by mouth daily., Disp: , Rfl:  ?  sitaGLIPtin-metformin (JANUMET) 50-1000 MG per tablet, Take 1 tablet by mouth 2 (two) times daily with a meal., Disp: , Rfl:  ?No current facility-administered medications for this  encounter. ? ?Facility-Administered Medications Ordered in Other Encounters:  ?  fentaNYL citrate (PF) (SUBLIMAZE) injection, , Intravenous, Anesthesia Intra-op, Everlean Cherry A, CRNA, 50 mcg at 09/16/20 0756 ?  lactated ringers infusion, , Intravenous, Continuous PRN, Colin Benton, CRNA, New Bag at 09/16/20 [phone] ?  midazolam (VERSED) 5 MG/5ML injection, , Intravenous, Anesthesia Intra-op, Everlean Cherry A, CRNA, 1 mg at 09/16/20 0160 ? ?Past Medical History: ?Past Medical History:  ?Diagnosis Date  ? Coronary artery calcification seen on CT scan   ? Diabetes mellitus ([place])   ? Hypertension   ? Patient is Jehovah's Witness   ? Renal insufficiency   ? Sleep apnea   ? CPAP nightly  ? ? ?Tobacco Use: ?Social History  ? ?Tobacco Use  ?Smoking Status Never  ?Smokeless Tobacco Never  ? ? ?Labs: ?Review  Flowsheet   ? ?  ?  Latest Ref Rng & Units 09/11/2020 09/22/2020 09/23/2020 09/24/2020  ?Labs for ITP Cardiac and Pulmonary Rehab  ?Direct LDL 0 - 99 mg/dL      ?Hemoglobin A1c 4.8 - 5.6 % 6.0       ?PH, Arterial 7.350 - 7.450 7.408   7.287    ? 7.282    ? 7.288    ? 7.297    ? 7.378    ? 7.474    ? 7.502    ? 7.436    ? 7.400      ?PCO2 arterial 32.0 - 48.0 mmHg 44.5   47.9    ? 49.2    ? 49.9    ? 52.2    ? 47.5    ? 40.0    ? 39.3    ? 46.2    ? 49.1      ?Bicarbonate 20.0 - 28.0 mmol/L 27.5   22.7    ? 23.1    ? 23.7    ? 25.4    ? 28.3    ? 29.4    ? 30.7    ? 30.5    ? 31.1    ? 30.4      ?TCO2 22 - 32 mmol/L  24    ? 25    ? 25    ? 27    ? 30    ? 29    ? 31    ? 32    ? 31    ? 30    ? 32    ? 32    ? 28    ? 28    ? 32    ? 30      ?Acid-base deficit 0.0 - 2.0 mmol/L  4.0    ? 4.0    ? 3.0    ? 2.0      ?O2 Saturation % 97.3   98.0    ? 98.0    ? 67.5    ? 98.0    ? 97.0    ? 97.0    ? 100.0    ? 100.0    ? 78.0    ? 100.0    ? 100.0   65.7   58.8    ? ?  08/27/2021  ?Labs for ITP Cardiac and Pulmonary Rehab  ?Direct LDL 14    ?Hemoglobin A1c   ?PH, Arterial   ?PCO2 arterial   ?Bicarbonate   ?TCO2   ?Acid-base deficit   ?O2 Saturation   ?  ? ? Multiple values from one day are sorted in reverse-chronological  order  ?  ?  ? ? ?Capillary Blood Glucose: ?Lab Results  ?Component Value Date  ? GLUCAP 269 (H) 10/11/2021  ? GLUCAP 262 (H) 10/04/2021  ? GLUCAP 258 (H) 10/04/2021  ? GLUCAP 262 (H) 10/01/2021  ? GLUCAP 290 (H) 10/01/2021  ? ? ? ?Exercise Target Goals: ?Exercise Program Goal: ?Individual exercise prescription set using results from initial 6 min walk test and THRR while considering  patient?s activity barriers and safety.  ? ?Exercise Prescription Goal: ?Starting with aerobic activity 30 plus minutes a day, 3 days per week for initial exercise prescription. Provide home exercise prescription and guidelines that participant acknowledges understanding prior to discharge. ? ?Activity Barriers & Risk  Stratification: ? Activity Barriers & Cardiac Risk Stratification - 09/21/21 1015   ? ?  ? Activity Barriers & Cardiac Risk Stratification  ? Activity Barriers Other (comment)   ? 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 . This is new for him and may be from wearing new pair of shoes.   ? Cardiac Risk Stratification High   ? ?  ?  ? ?  ? ? ?6 Minute Walk: ? 6 Minute Walk   ? ? [place] Name 09/21/21 1044 11/15/21 1715  ?  ?  ? 6 Minute Walk  ? Phase Initial Discharge   ? Distance 1027 feet 1157 feet   ? Distance % Change -- 12.66 %   ? Distance Feet Change -- 130 ft   ? Walk Time 6 minutes 6 minutes   ? # of Rest Breaks 0 0   ? MPH 1.94 2.19   ? METS 1.74 1.86   ? RPE 11 11   ? Perceived Dyspnea  1 0   ? VO2 Peak 6.08 6.52   ? Symptoms Yes (comment) No   ? Comments Patient c/o fatigue and mild shortness of breath. --   ? Resting HR 95 bpm 82 bpm   ? Resting BP 122/72 110/70   ? Resting Oxygen Saturation  98 % 97 %   ? Exercise Oxygen Saturation  during 6 min walk 96 % 97 %   ? Max Ex. HR 109 bpm 112 bpm   ? Max Ex. BP 148/68 132/60   ? 2 Minute Post BP 118/62 112/62   ? ?  ?  ? ?  ? ? ?Oxygen Initial Assessment: ? ? ?Oxygen Re-Evaluation: ? ? ?Oxygen Discharge (Final Oxygen Re-Evaluation): ? ? ?Initial Exercise Prescription: ? Initial Exercise Prescription - 09/21/21 1400   ? ?  ? Date of Initial Exercise RX and Referring Provider  ? Date 09/21/21   ? Referring Provider Sueanne Margarita, MD   ? Expected Discharge Date 11/19/21   ?  ? NuStep  ? Level 2   ? SPM 85   ? Minutes 15   ? METs 1.7   ?  ? Track  ? Laps 10   ? Minutes 15   ? METs 2.16   ?  ? Prescription Details  ? Frequency (times per week) 3   ? Duration Progress to 30 minutes of continuous aerobic without signs/symptoms of physical distress   ?  ? Intensity  ? THRR 40-80% of Max Heartrate 59-118   ? Ratings of Perceived Exertion 11-13   ? Perceived Dyspnea 0-4   ?  ? Progression  ? Progression Continue to progress  workloads to maintain intensity without signs/symptoms of physical distress.   ?  ? Resistance Training  ?  Training Prescription Yes   ? Weight 3 lbs   ? Reps 10-15   ? ?  ?  ? ?  ? ? ?Perform Capillary Blood Glucose

## 2021-11-26 ENCOUNTER — Encounter (HOSPITAL_COMMUNITY)
Admission: RE | Admit: 2021-11-26 | Discharge: 2021-11-26 | Disposition: A | Discharge status: Home / Self Care | Payer: Medicare HMO | Source: Ambulatory Visit | Attending: Cardiology | Admitting: Cardiology

## 2021-11-26 DIAGNOSIS — Z4889 Encounter for other specified surgical aftercare: Secondary | ICD-10-CM | POA: Diagnosis not present

## 2021-11-26 DIAGNOSIS — Z951 Presence of aortocoronary bypass graft: Secondary | ICD-10-CM

## 2021-11-29 ENCOUNTER — Encounter (HOSPITAL_COMMUNITY)
Admission: RE | Admit: 2021-11-29 | Discharge: 2021-11-29 | Disposition: A | Discharge status: Home / Self Care | Payer: Medicare HMO | Source: Ambulatory Visit | Attending: Cardiology | Admitting: Cardiology

## 2021-11-29 DIAGNOSIS — Z4889 Encounter for other specified surgical aftercare: Secondary | ICD-10-CM | POA: Diagnosis not present

## 2021-11-29 DIAGNOSIS — Z951 Presence of aortocoronary bypass graft: Secondary | ICD-10-CM

## 2021-12-02 NOTE — Progress Notes (Signed)
Discharge Progress Report ? ?Patient Details  ?Name: Juan Hatfield ?MRN: [id number] ?Date of Birth: 1946-03-30 ?Referring Provider:   ?Flowsheet Row CARDIAC REHAB PHASE II ORIENTATION from 09/21/2021 in [facility]  ?Referring Provider Sueanne Margarita, MD  ? ?  ? ? ? ?Number of Visits: 19 ? ?Reason for Discharge:  ?Patient reached a stable level of exercise. ?Patient has met program and personal goals. ? ?Smoking History:  ?Social History  ? ?Tobacco Use  ?Smoking Status Never  ?Smokeless Tobacco Never  ? ? ?Diagnosis:  ?09/22/20 S/P CABG x 3 ? ?ADL UCSD: ? ? ?Initial Exercise Prescription: ? Initial Exercise Prescription - 09/21/21 1400   ? ?  ? Date of Initial Exercise RX and Referring Provider  ? Date 09/21/21   ? Referring Provider Sueanne Margarita, MD   ? Expected Discharge Date 11/19/21   ?  ? NuStep  ? Level 2   ? SPM 85   ? Minutes 15   ? METs 1.7   ?  ? Track  ? Laps 10   ? Minutes 15   ? METs 2.16   ?  ? Prescription Details  ? Frequency (times per week) 3   ? Duration Progress to 30 minutes of continuous aerobic without signs/symptoms of physical distress   ?  ? Intensity  ? THRR 40-80% of Max Heartrate 59-118   ? Ratings of Perceived Exertion 11-13   ? Perceived Dyspnea 0-4   ?  ? Progression  ? Progression Continue to progress workloads to maintain intensity without signs/symptoms of physical distress.   ?  ? Resistance Training  ? Training Prescription Yes   ? Weight 3 lbs   ? Reps 10-15   ? ?  ?  ? ?  ? ? ?Discharge Exercise Prescription (Final Exercise Prescription Changes): ? Exercise Prescription Changes - 12/03/21 1635   ? ?  ? Response to Exercise  ? Blood Pressure (Admit) 112/62   ? Blood Pressure (Exercise) 132/78   ? Blood Pressure (Exit) 118/60   ? Heart Rate (Admit) 88 bpm   ? Heart Rate (Exercise) 113 bpm   ? Heart Rate (Exit) 97 bpm   ? Rating of Perceived Exertion (Exercise) 11   ? Perceived Dyspnea (Exercise) 0   ? Symptoms 0   ? Comments Pt graduated the CRP2  program   ? Duration Progress to 30 minutes of  aerobic without signs/symptoms of physical distress   ? Intensity THRR unchanged   ?  ? Progression  ? Progression Continue to progress workloads to maintain intensity without signs/symptoms of physical distress.   ? Average METs 2.21   ?  ? Resistance Training  ? Training Prescription Yes   ? Weight 4 lbs   ? Reps 10-15   ? Time 10 Minutes   ?  ? NuStep  ? Level 3   Reviewed Education. Decided to stay on one piece of equipment today  ? SPM 85   ? Minutes 30   ? METs 1.9   ?  ? Track  ? Laps 13   ? Minutes 15   ? METs 2.51   ?  ? Home Exercise Plan  ? Plans to continue exercise at Home (comment)   ? Frequency Add 4 additional days to program exercise sessions.   ? Initial Home Exercises Provided 10/15/21   ? ?  ?  ? ?  ? ? ?Functional Capacity: ? 6 Minute Walk   ? ?  Baker Name 09/21/21 1044 11/15/21 1715  ?  ?  ? 6 Minute Walk  ? Phase Initial Discharge   ? Distance 1027 feet 1157 feet   ? Distance % Change -- 12.66 %   ? Distance Feet Change -- 130 ft   ? Walk Time 6 minutes 6 minutes   ? # of Rest Breaks 0 0   ? MPH 1.94 2.19   ? METS 1.74 1.86   ? RPE 11 11   ? Perceived Dyspnea  1 0   ? VO2 Peak 6.08 6.52   ? Symptoms Yes (comment) No   ? Comments Patient c/o fatigue and mild shortness of breath. --   ? Resting HR 95 bpm 82 bpm   ? Resting BP 122/72 110/70   ? Resting Oxygen Saturation  98 % 97 %   ? Exercise Oxygen Saturation  during 6 min walk 96 % 97 %   ? Max Ex. HR 109 bpm 112 bpm   ? Max Ex. BP 148/68 132/60   ? 2 Minute Post BP 118/62 112/62   ? ?  ?  ? ?  ? ? ?Psychological, QOL, Others - Outcomes: ?PHQ 2/9: ? ?  12/09/2021  ?  3:32 PM 09/21/2021  ?  4:35 PM  ?Depression screen PHQ 2/9  ?Decreased Interest 0 0  ?Down, Depressed, Hopeless 0 0  ?PHQ - 2 Score 0 0  ? ? ?Quality of Life: ? Quality of Life - 11/22/21 1440   ? ?  ? Quality of Life  ? Select Quality of Life   ?  ? Quality of Life Scores  ? Health/Function Pre 21.97 %   ? Health/Function Post 25.23 %    ? Health/Function % Change 14.84 %   ? Socioeconomic Pre 28.33 %   ? Socioeconomic Post 30 %   ? Socioeconomic % Change  5.89 %   ? Psych/Spiritual Pre 30 %   ? Psych/Spiritual Post 30 %   ? Psych/Spiritual % Change 0 %   ? Family Pre 30 %   ? Family Post 30 %   ? Family % Change 0 %   ? GLOBAL Pre 25.92 %   ? GLOBAL Post 27.69 %   ? GLOBAL % Change 6.83 %   ? ?  ?  ? ?  ? ? ?Personal Goals: ?Goals established at orientation with interventions provided to work toward goal. ? Personal Goals and Risk Factors at Admission - 09/21/21 1015   ? ?  ? Core Components/Risk Factors/Patient Goals on Admission  ?  Weight Management Yes;Obesity;Weight Loss   ? Intervention Weight Management/Obesity: Establish reasonable short term and long term weight goals.;Obesity: Provide education and appropriate resources to help participant work on and attain dietary goals.;Weight Management: Provide education and appropriate resources to help participant work on and attain dietary goals.   ? Admit Weight 243 lb 9.7 oz (110.5 kg)   ? Goal Weight: Short Term 237 lb (107.5 kg)   ? Goal Weight: Long Term 185 lb (83.9 kg)   ? Expected Outcomes Short Term: Continue to assess and modify interventions until short term weight is achieved;Long Term: Adherence to nutrition and physical activity/exercise program aimed toward attainment of established weight goal;Weight Loss: Understanding of general recommendations for a balanced deficit meal plan, which promotes 1-2 lb weight loss per week and includes a negative energy balance of [phone] kcal/d   ? Diabetes Yes   ? Intervention Provide education about signs/symptoms and action  to take for hypo/hyperglycemia.;Provide education about proper nutrition, including hydration, and aerobic/resistive exercise prescription along with prescribed medications to achieve blood glucose in normal ranges: Fasting glucose 65-99 mg/dL   ? Expected Outcomes Short Term: Participant verbalizes understanding of the  signs/symptoms and immediate care of hyper/hypoglycemia, proper foot care and importance of medication, aerobic/resistive exercise and nutrition plan for blood glucose control.;Long Term: Attainment of HbA1C < 7%.   ? Hypertension Yes   ? Intervention Provide education on lifestyle modifcations including regular physical activity/exercise, weight management, moderate sodium restriction and increased consumption of fresh fruit, vegetables, and low fat dairy, alcohol moderation, and smoking cessation.;Monitor prescription use compliance.   ? Expected Outcomes Short Term: Continued assessment and intervention until BP is < 140/36m HG in hypertensive participants. < 130/893mHG in hypertensive participants with diabetes, heart failure or chronic kidney disease.;Long Term: Maintenance of blood pressure at goal levels.   ? ?  ?  ? ?  ?  ? ?Personal Goals Discharge: ? Goals and Risk Factor Review   ? ? RoPalmerame 09/28/21 1450 10/26/21 1706 11/24/21 1009  ?  ?  ?  ? Core Components/Risk Factors/Patient Goals Review  ? Personal Goals Review Weight Management/Obesity;Hypertension;Diabetes Weight Management/Obesity;Hypertension;Diabetes Weight Management/Obesity;Hypertension;Diabetes    ? Review Yuki started exercise at cardiac reha on 09/27/20. Yama did well with exercise. Vital signs and CBG's were stable JoEsgaras been doing well with exercise at phase 2 cardiac rehab. Vital signs and weights have been stable. CBG's have been variable. Patient's primary care provider was notified by fax. JoLennisas been doing well with exercise at phase 2 cardiac rehab. Vital signs and weights and CBG's have been stable. JoYunusill complete phase 2 cardiac rehab at  the end of the month    ? Expected Outcomes JoSheppardill continue to participate in phase 2 cardiac rehab for exercise, nutrition and lifestyle modifications JoKaioill continue to participate in phase 2 cardiac rehab for exercise, nutrition and lifestyle modifications  JoCarlsonill continue to participate in phase 2 cardiac rehab for exercise, nutrition and lifestyle modifications    ? ?  ?  ? ?  ? ? ?Exercise Goals and Review: ? Exercise Goals   ? ? RoGamalielame 09/21/21 0930

## 2021-12-03 ENCOUNTER — Encounter (HOSPITAL_COMMUNITY)
Admission: RE | Admit: 2021-12-03 | Discharge: 2021-12-03 | Disposition: A | Discharge status: Home / Self Care | Payer: Medicare HMO | Source: Ambulatory Visit | Attending: Cardiology | Admitting: Cardiology

## 2021-12-03 DIAGNOSIS — Z951 Presence of aortocoronary bypass graft: Secondary | ICD-10-CM

## 2021-12-03 DIAGNOSIS — Z4889 Encounter for other specified surgical aftercare: Secondary | ICD-10-CM | POA: Diagnosis not present

## 2022-09-13 ENCOUNTER — Ambulatory Visit: Payer: Medicare HMO | Admitting: Cardiology

## 2022-10-07 NOTE — Progress Notes (Deleted)
Cardiology Office Note:    Date:  10/07/2022   ID:  Juan Hatfield, DOB 11/25/1945, MRN [id number]  PCP:  Juan Hatfield, [place] Providers Cardiologist:  Fransico Him, MD { Click to update primary MD,subspecialty MD or APP then REFRESH:1}  *** Referring MD: Juan Fanny, DO   Chief Complaint:  No chief complaint on file. {Click here for Visit Info    :1}    History of Present Illness:   Juan Hatfield is a 77 y.o. male with with a hx of DM2, hypertension, renal insufficiency, right bundle branch block, refusal of blood products ([employer]), coronary artery disease s/p CABG (LIMA-LAD, SVG-diagonal, SVG-OM 09/2020), OSA, dyspnea on exertion,       Echo 07/30/21 with normal LVEF, gr1DD, mild LVH, no significant valvular abnormalities.  Patient last saw Ms. Walker 08/2021 and doing well, chronic DOE.     Past Medical History:  Diagnosis Date   Coronary artery calcification seen on CT scan    Diabetes mellitus ([place])    Hypertension    Patient is Jehovah's Witness    Renal insufficiency    Sleep apnea    CPAP nightly   Current Medications: No outpatient medications have been marked as taking for the 10/11/22 encounter (Appointment) with Imogene Burn, PA-C.    Allergies:   Other   Social History   Tobacco Use   Smoking status: Never   Smokeless tobacco: Never  Vaping Use   Vaping Use: Never used  Substance Use Topics   Alcohol use: Not Currently   Drug use: Never    Family Hx: The patient's family history includes CAD in his brother; Cholecystitis in his mother; Lung disease in his father.  ROS     Physical Exam:    VS:  There were no vitals taken for this visit.    Wt Readings from Last 3 Encounters:  11/15/21 243 lb 13.3 oz (110.6 kg)  09/21/21 243 lb 9.7 oz (110.5 kg)  08/27/21 245 lb 4.8 oz (111.3 kg)    Physical Exam  GEN: Well nourished, well developed, in no acute distress  HEENT: normal  Neck: no JVD, carotid bruits, or  masses Cardiac:RRR; no murmurs, rubs, or gallops  Respiratory:  clear to auscultation bilaterally, normal work of breathing GI: soft, nontender, nondistended, + BS Ext: without cyanosis, clubbing, or edema, Good distal pulses bilaterally MS: no deformity or atrophy  Skin: warm and dry, no rash Neuro:  Alert and Oriented x 3, Strength and sensation are intact Psych: euthymic mood, full affect        EKGs/Labs/Other Test Reviewed:    EKG:  EKG is *** ordered today.  The ekg ordered today demonstrates ***  Recent Labs: No results found for requested labs within last 365 days.   Recent Lipid Panel No results for input(s): "CHOL", "TRIG", "HDL", "VLDL", "LDLCALC", "LDLDIRECT" in the last 8760 hours.   Prior CV Studies: {Select studies to display:26339}    Echo 07/30/21 1. Mild intracavitary gradient. Peak velocity 1.39 m/s. Peak gradient 7.7  mmHg. Left ventricular ejection fraction, by estimation, is 65 to 70%. The  left ventricle has normal function. The left ventricle has no regional  wall motion abnormalities. There  is mild concentric left ventricular hypertrophy. Left ventricular  diastolic parameters are consistent with Grade I diastolic dysfunction  (impaired relaxation).   2. Right ventricular systolic function is normal. The right ventricular  size is normal.   3. The mitral valve is  normal in structure. No evidence of mitral valve  regurgitation. No evidence of mitral stenosis.   4. The aortic valve is tricuspid. Aortic valve regurgitation is not  visualized. No aortic stenosis is present.   5. The inferior vena cava is normal in size with greater than 50%  respiratory variability, suggesting right atrial pressure of 3 mmHg.    Doppler Pre CABG Carotid/Arms/Legs 09/11/20 Summary:  Right Carotid: Velocities in the right ICA are consistent with a 1-39%  stenosis.                The extracranial vessels were near-normal with only minimal  wall                 thickening or plaque.   Left Carotid: Velocities in the left ICA are consistent with a 1-39%  stenosis.               The extracranial vessels were near-normal with only minimal  wall               thickening or plaque.  Vertebrals:  Bilateral vertebral arteries demonstrate antegrade flow.  Subclavians: Normal flow hemodynamics were seen in bilateral subclavian               arteries.   Right ABI: Resting right ankle-brachial index indicates noncompressible  right lower extremity arteries. The right toe-brachial index is normal.  Left ABI: Resting left ankle-brachial index indicates noncompressible left  lower extremity arteries. The left toe-brachial index is normal.  Right Upper Extremity: Doppler waveforms decrease >50% with right radial  compression. Doppler waveforms decrease >50% with right ulnar compression.  Left Upper Extremity: Doppler waveforms decrease >50% with left radial  compression. Doppler waveforms remain within normal limits with left ulnar  compression.    Echo 09/11/20  1. Left ventricular ejection fraction, by estimation, is 60 to 65%. The  left ventricle has normal function. The left ventricle has no regional  wall motion abnormalities. Left ventricular diastolic parameters are  consistent with Grade I diastolic  dysfunction (impaired relaxation). Elevated left ventricular end-diastolic  pressure.   2. Right ventricular systolic function is normal. The right ventricular  size is normal. There is normal pulmonary artery systolic pressure.   3. The mitral valve is normal in structure. Trivial mitral valve  regurgitation. No evidence of mitral stenosis.   4. The aortic valve is normal in structure. Aortic valve regurgitation is  not visualized. No aortic stenosis is present.   5. The inferior vena cava is normal in size with greater than 50%  respiratory variability, suggesting right atrial pressure of 3 mmHg.     Risk Assessment/Calculations/Metrics:    {Does this patient have ATRIAL FIBRILLATION?[phone]}     No BP recorded.  {Refresh Note OR Click here to enter BP  :1}***    ASSESSMENT & PLAN:   No problem-specific Assessment & Plan notes found for this encounter.   DOE - Echo 07/2021 normal LVEF, mild LVH, gr1DD. 07/15/21 Hb 13.4 - no evidence of anemia as contributory. Improving since increased diuresis. Deconditioning likely contributory. Continue regular exercise. BMP, BNP today for monitoring.    CAD s/p CABGx3 - Stable with no anginal symptoms. No indication for ischemic evaluation.  GDMT includes aspirin, imdur, rosuvastatin, PRN nitroglycerin.  Heart healthy diet and regular cardiovascular exercise encouraged.     Hypertension- BP well controlled. Continue current antihypertensive regimen.  Continue imdur '30mg'$  QD, Coreg 3.'125mg'$  BID, Losartan  '20mg'$  QD.   Hyperlipidemia, LDL goal  less than 70 -  Continue Crestor '5mg'$  QD. Direct LDL today for monitoring.    DM2 - Continue to follow with PCP.    [id number] - Careful titration of diuretic and antihypertensive.  BMP today.   Carotid artery stenosis - Duplex 09/11/20 bilateral 1-39% stenosis. Continue optimal BP and lipid control.           {Are you ordering a CV Procedure (e.g. stress test, cath, DCCV, TEE, etc)?   Press F2        :[id number]   Dispo:  No follow-ups on file.   Medication Adjustments/Labs and Tests Ordered: Current medicines are reviewed at length with the patient today.  Concerns regarding medicines are outlined above.  Tests Ordered: No orders of the defined types were placed in this encounter.  Medication Changes: No orders of the defined types were placed in this encounter.  Sumner Boast, PA-C  10/07/2022 8:56 AM    [facility] [place], [place], [place]  [address] Phone: [phone]; Fax: [phone]

## 2022-10-11 ENCOUNTER — Ambulatory Visit: Payer: Medicare HMO | Admitting: Physician Assistant

## 2022-10-12 ENCOUNTER — Ambulatory Visit: Payer: Medicare HMO | Attending: Physician Assistant | Admitting: Physician Assistant

## 2022-10-12 ENCOUNTER — Ambulatory Visit: Payer: Medicare HMO | Admitting: Cardiology

## 2022-10-12 ENCOUNTER — Encounter: Payer: Self-pay | Admitting: Physician Assistant

## 2022-10-12 VITALS — BP 138/82 | HR 72 | Ht 66.0 in | Wt 247.8 lb

## 2022-10-12 DIAGNOSIS — I6523 Occlusion and stenosis of bilateral carotid arteries: Secondary | ICD-10-CM | POA: Diagnosis not present

## 2022-10-12 DIAGNOSIS — E785 Hyperlipidemia, unspecified: Secondary | ICD-10-CM

## 2022-10-12 DIAGNOSIS — I25118 Atherosclerotic heart disease of native coronary artery with other forms of angina pectoris: Secondary | ICD-10-CM | POA: Diagnosis not present

## 2022-10-12 DIAGNOSIS — R0600 Dyspnea, unspecified: Secondary | ICD-10-CM

## 2022-10-12 DIAGNOSIS — I10 Essential (primary) hypertension: Secondary | ICD-10-CM | POA: Diagnosis not present

## 2022-10-12 DIAGNOSIS — Z951 Presence of aortocoronary bypass graft: Secondary | ICD-10-CM

## 2022-10-12 NOTE — Patient Instructions (Signed)
Medication Instructions:  Your physician recommends that you continue on your current medications as directed. Please refer to the Current Medication list given to you today.  *If you need a refill on your cardiac medications before your next appointment, please call your pharmacy*   Lab Work: BMET, LIPIDs and LFT'S today If you have labs (blood work) drawn today and your tests are completely normal, you will receive your results only by: [place] (if you have MyChart) OR A paper copy in the mail If you have any lab test that is abnormal or we need to change your treatment, we will call you to review the results.   Follow-Up: At [facility], you and your health needs are our priority.  As part of our continuing mission to provide you with exceptional heart care, we have created designated Provider Care Teams.  These Care Teams include your primary Cardiologist (physician) and Advanced Practice Providers (APPs -  Physician Assistants and Nurse Practitioners) who all work together to provide you with the care you need, when you need it.  We recommend signing up for the patient portal called "MyChart".  Sign up information is provided on this After Visit Summary.  MyChart is used to connect with patients for Virtual Visits (Telemedicine).  Patients are able to view lab/test results, encounter notes, upcoming appointments, etc.  Non-urgent messages can be sent to your provider as well.   To learn more about what you can do with MyChart, go to [email or web address].    Your next appointment:   1 year(s)  Provider:   Fransico Him, MD

## 2022-10-12 NOTE — Progress Notes (Signed)
Office Visit    Patient Name: Juan Hatfield Date of Encounter: 10/12/2022  PCP:  Wynelle Fanny, [facility]  Cardiologist:  Fransico Him, MD  Advanced Practice Provider:  No care team member to display Electrophysiologist:  None   HPI    Juan Hatfield is a 77 y.o. male with past medical history of DM 2, hypertension, renal sufficiency, right bundle branch block, refusal of blood products ([employer] Witness), CAD status post CABG (LIMA to LAD, SVG to diagonal, SVG to OM 09/2020), OSA, dyspnea on exertion, COVID-19 (07/2021 not requiring hospitalization) presents today for annual follow-up visit.  Patient was originally on a Dr. Radford Pax schedule for today but since she is not here he was placed on my schedule.  Initially seen for chest pain.  Coronary calcium score was elevated.  Nuclear study was abnormal.  Referred for cardiac catheterization showing three-vessel disease with preserved LVEF.  Persistent symptoms and recommended for CABG which was performed 09/22/2020 with LIMA to LAD, SVG to diagonal, SVG to OM.  Seen in the clinic 09/2020 by Dr. Radford Pax was doing well postsurgery.  No changes made at that time.  Seen in the ED 07/15/2021 due to 2-week history of cold-like symptoms.  Tested positive for COVID-19.  He was outside the window for monoclonal antibodies.  Was seen 07/21/2021 and reported increased dyspnea on exertion.  Lasix was increased to 40 mg and he was referred to cardiac rehab.  Echo 12/22 with normal LVEF, grade 1 DD, mild LVH, no significant valvular abnormalities.  He was seen last year for annual follow-up.  Had not been in contact with cardiac rehab but been exercising on his bike at home.  No chest pain, pressure, tightness.  Reported his dyspnea on exertion was overall improving.  Denied orthopnea, PND.  Lower extremity edema was well-controlled with compression stockings.  Echocardiogram reviewed at that time.  Today, he states that over the  last year things have been pretty stable from a cardiac standpoint.  Weight has been stable.  He does endorse lightheadedness if he gets up too quickly.  We discussed hydration and slow movement from sitting to standing.  He states that since his surgery he occasionally gets some tenderness in his chest due to the nerves "tingling".  He can now predict when it is going to rain.  He has created a new normal for his activity level and has been trying to get used to that.  He has 2 sons that can help him do more rigorous activities like chopping wood.  His anginal equivalent was shortness of breath with activity.  He has not experienced this since his CABG.   Reports no shortness of breath nor dyspnea on exertion. Reports no chest pain, pressure, or tightness. No edema, orthopnea, PND. Reports no palpitations.    Past Medical History:  Diagnosis Date   Coronary artery calcification seen on CT scan    Diabetes mellitus ([place])    Hypertension    Patient is Jehovah's Witness    Renal insufficiency    Sleep apnea    CPAP nightly   Past Surgical History:  Procedure Laterality Date   BACK SURGERY     CARDIAC CATHETERIZATION     COLONOSCOPY     COLONOSCOPY WITH PROPOFOL N/A 04/02/2019   Procedure: COLONOSCOPY WITH PROPOFOL;  Surgeon: Wilford Corner, MD;  Location: [facility];  Service: Endoscopy;  Laterality: N/A;   CORONARY ARTERY BYPASS GRAFT N/A 09/22/2020  Procedure: CORONARY ARTERY BYPASS GRAFTING (CABG), ON PUMP, TIMES THREE, USING LEFT INTERNAL MAMMARY ARTERY AND ENDOSCOPICALLY HARVESTED RIGHT GREATER SAPHENOUS VEIN;  Surgeon: Wonda Olds, MD;  Location: [place];  Service: Open Heart Surgery;  Laterality: N/A;   ENDOVEIN HARVEST OF GREATER SAPHENOUS VEIN Right 09/22/2020   Procedure: ENDOVEIN HARVEST OF GREATER SAPHENOUS VEIN;  Surgeon: Wonda Olds, MD;  Location: [place];  Service: Open Heart Surgery;  Laterality: Right;   LEFT HEART CATH AND CORONARY ANGIOGRAPHY N/A 07/20/2020    Procedure: LEFT HEART CATH AND CORONARY ANGIOGRAPHY;  Surgeon: Lorretta Harp, MD;  Location: [place] [facility];  Service: Cardiovascular;  Laterality: N/A;   LUMBAR LAMINECTOMY/DECOMPRESSION MICRODISCECTOMY Right 01/01/2015   Procedure: LUMBAR LAMINECTOMY/DECOMPRESSION MICRODISCECTOMY RIGHT LUMBAR FIVE SACRAL ONE;  Surgeon: Jovita Gamma, MD;  Location: [place] [facility];  Service: Neurosurgery;  Laterality: Right;   POLYPECTOMY  04/02/2019   Procedure: POLYPECTOMY;  Surgeon: Wilford Corner, MD;  Location: [facility];  Service: Endoscopy;;   TEE WITHOUT CARDIOVERSION N/A 09/22/2020   Procedure: TRANSESOPHAGEAL ECHOCARDIOGRAM (TEE);  Surgeon: Wonda Olds, MD;  Location: [place];  Service: Open Heart Surgery;  Laterality: N/A;    Allergies  Allergies  Allergen Reactions   Other     No blood or blood products     EKGs/Labs/Other Studies Reviewed:   The following studies were reviewed today:  Echo 12/22 IMPRESSIONS     1. Mild intracavitary gradient. Peak velocity 1.39 m/s. Peak gradient 7.7  mmHg. Left ventricular ejection fraction, by estimation, is 65 to 70%. The  left ventricle has normal function. The left ventricle has no regional  wall motion abnormalities. There  is mild concentric left ventricular hypertrophy. Left ventricular  diastolic parameters are consistent with Grade I diastolic dysfunction  (impaired relaxation).   2. Right ventricular systolic function is normal. The right ventricular  size is normal.   3. The mitral valve is normal in structure. No evidence of mitral valve  regurgitation. No evidence of mitral stenosis.   4. The aortic valve is tricuspid. Aortic valve regurgitation is not  visualized. No aortic stenosis is present.   5. The inferior vena cava is normal in size with greater than 50%  respiratory variability, suggesting right atrial pressure of 3 mmHg.   FINDINGS   Left Ventricle: Mild intracavitary gradient. Peak velocity 1.39 m/s. Peak   gradient 7.7 mmHg. Left ventricular ejection fraction, by estimation, is  65 to 70%. The left ventricle has normal function. The left ventricle has  no regional wall motion  abnormalities. The left ventricular internal cavity size was normal in  size. There is mild concentric left ventricular hypertrophy. Left  ventricular diastolic parameters are consistent with Grade I diastolic  dysfunction (impaired relaxation).  Indeterminate filling pressures.   Right Ventricle: The right ventricular size is normal. No increase in  right ventricular wall thickness. Right ventricular systolic function is  normal.   Left Atrium: Left atrial size was normal in size.   Right Atrium: Right atrial size was normal in size.   Pericardium: There is no evidence of pericardial effusion.   Mitral Valve: The mitral valve is normal in structure. No evidence of  mitral valve regurgitation. No evidence of mitral valve stenosis.   Tricuspid Valve: The tricuspid valve is normal in structure. Tricuspid  valve regurgitation is not demonstrated. No evidence of tricuspid  stenosis.   Aortic Valve: The aortic valve is tricuspid. Aortic valve regurgitation is  not visualized. No aortic stenosis  is present.   Pulmonic Valve: The pulmonic valve was normal in structure. Pulmonic valve  regurgitation is not visualized. No evidence of pulmonic stenosis.   Aorta: The aortic root is normal in size and structure.   Venous: The inferior vena cava is normal in size with greater than 50%  respiratory variability, suggesting right atrial pressure of 3 mmHg.   IAS/Shunts: No atrial level shunt detected by color flow Doppler.    EKG:  EKG is  ordered today.  The ekg ordered today demonstrates NSR RATE 72 BPM, Known RBBB  Recent Labs: No results found for requested labs within last 365 days.  Recent Lipid Panel    Component Value Date/Time   CHOL 77 (L) 07/15/2020 0716   TRIG 82 07/15/2020 0716   HDL 47 07/15/2020  0716   CHOLHDL 1.6 07/15/2020 0716   LDLCALC 13 07/15/2020 0716   LDLDIRECT 14 08/27/2021 1609     Home Medications   Current Meds  Medication Sig   Ascorbic Acid (VITAMIN C) 100 MG tablet Take 200 mg by mouth in the morning and at bedtime.   aspirin EC 81 MG tablet Take 1 tablet (81 mg total) by mouth daily. Swallow whole.   carvedilol (COREG) 3.125 MG tablet Take 1 tablet (3.125 mg total) by mouth 2 (two) times daily. (Patient taking differently: Take 3.125 mg by mouth in the morning and at bedtime.)   Cholecalciferol (VITAMIN D-3) 125 MCG (5000 UT) TABS Take 5,000 Units by mouth in the morning.   Coenzyme Q10 (CO Q 10) 100 MG CAPS Take 100 mg by mouth in the morning.   Cyanocobalamin (VITAMIN B-12 PO) Take 1 tablet by mouth in the morning.   furosemide (LASIX) 20 MG tablet Take 1 tablet (20 mg total) by mouth daily. Please take 1 tablet daily, you may take an extra tablet for a weight gain of 2lbs overnight or 5 pounds in a week or if lower extremity swelling returns.   Grape Seed Extract 50 MG CAPS Take 100 mg by mouth in the morning.   isosorbide mononitrate (IMDUR) 30 MG 24 hr tablet Take 30 mg by mouth in the morning.   JANUVIA 50 MG tablet Take 50 mg by mouth daily.   Lancet Devices (ONETOUCH DELICA PLUS LANCING) MISC    Lancets (ONETOUCH DELICA PLUS 123XX123) MISC Apply 1 each topically daily.   LEVEMIR FLEXTOUCH 100 UNIT/ML FlexPen Inject 40 Units into the skin in the morning.   losartan (COZAAR) 25 MG tablet Take 25 mg by mouth in the morning.   Magnesium 250 MG TABS Take 250 mg by mouth in the morning.   metFORMIN (GLUCOPHAGE) 500 MG tablet Take 1,000 mg by mouth 2 (two) times daily.   milk thistle 175 MG tablet Take 175 mg by mouth in the morning.   Multiple Vitamin (MULTIVITAMIN WITH MINERALS) TABS tablet Take 1 tablet by mouth in the morning.   nitroGLYCERIN (NITROSTAT) 0.4 MG SL tablet PLACE 1 TABLET (0.4 MG TOTAL) UNDER THE TONGUE EVERY FIVE MINUTES AS NEEDED FOR CHEST  PAIN (UP TO 3 DOSES).   ONETOUCH ULTRA test strip    pantoprazole (PROTONIX) 20 MG tablet Take 20 mg by mouth in the morning.   potassium chloride SA (KLOR-CON M) 20 MEQ tablet Take 20 mEq by mouth in the morning.   rosuvastatin (CRESTOR) 5 MG tablet Take 5 mg by mouth daily.   [DISCONTINUED] sitaGLIPtin-metformin (JANUMET) 50-1000 MG per tablet Take 1 tablet by mouth 2 (two) times daily  with a meal.     Review of Systems      All other systems reviewed and are otherwise negative except as noted above.  Physical Exam    VS:  BP 138/82   Pulse 72   Ht '5\' 6"'$  (1.676 m)   Wt 247 lb 12.8 oz (112.4 kg)   SpO2 97%   BMI 40.00 kg/m  , BMI Body mass index is 40 kg/m.  Wt Readings from Last 3 Encounters:  10/12/22 247 lb 12.8 oz (112.4 kg)  11/15/21 243 lb 13.3 oz (110.6 kg)  09/21/21 243 lb 9.7 oz (110.5 kg)     GEN: Well nourished, well developed, in no acute distress. HEENT: normal. Neck: Supple, no JVD, carotid bruits, or masses. Cardiac: RRR, no murmurs, rubs, or gallops. No clubbing, cyanosis, edema.  Radials/PT 2+ and equal bilaterally.  Respiratory:  Respirations regular and unlabored, clear to auscultation bilaterally. GI: Soft, nontender, nondistended. MS: No deformity or atrophy. Skin: Warm and dry, no rash. Neuro:  Strength and sensation are intact. Psych: Normal affect.  Assessment & Plan    CAD status post CABG  -No chest pain or significant shortness of breath -Would plan to continue current medications including aspirin 81 mg daily, Coreg 3.125 mg twice a day, Lasix 20 mg daily and an extra 20 mg as needed for lower extremity edema, Imdur 30 mg daily, Cozaar 25 mg daily, nitro as needed, Crestor 5 mg daily  Hyperlipidemia -We will need to get an updated lipid panel and LFTs today -Last LDL 13 (2021) -continue current medications  Hypertension -Blood pressure well-controlled today, 138/82 -Continue to monitor blood pressure at home -Continue current  medication regimen  Type 2 diabetes mellitus -Last A1c 6.0 -Per primary  Known right bundle branch block -Present on EKG today, asymptomatic  Renal insufficiency -Will order updated BMP -Last creatinine 1.9         Disposition: Follow up 1 year with Fransico Him, MD or APP.  Signed, Elgie Collard, PA-C 10/12/2022, 10:08 AM [place]

## 2022-10-13 LAB — LIPID PANEL
Chol/HDL Ratio: 2 ratio (ref 0.0–5.0)
Cholesterol, Total: 95 mg/dL — ABNORMAL LOW (ref 100–199)
HDL: 47 mg/dL (ref >39)
LDL Chol Calc (NIH): 33 mg/dL (ref 0–99)
Triglycerides: 68 mg/dL (ref 0–149)
VLDL Cholesterol Cal: 15 mg/dL (ref 5–40)

## 2022-10-13 LAB — BASIC METABOLIC PANEL
BUN/Creatinine Ratio: 7 — ABNORMAL LOW (ref 10–24)
BUN: 13 mg/dL (ref 8–27)
CO2: 28 mmol/L (ref 20–29)
Calcium: 10 mg/dL (ref 8.6–10.2)
Chloride: 101 mmol/L (ref 96–106)
Creatinine, Ser: 1.81 mg/dL — ABNORMAL HIGH (ref 0.76–1.27)
Glucose: 117 mg/dL — ABNORMAL HIGH (ref 70–99)
Potassium: 4.9 mmol/L (ref 3.5–5.2)
Sodium: 141 mmol/L (ref 134–144)
eGFR: 39 mL/min/{1.73_m2} — ABNORMAL LOW (ref >59)

## 2022-10-13 LAB — HEPATIC FUNCTION PANEL
ALT: 25 IU/L (ref 0–44)
AST: 23 IU/L (ref 0–40)
Albumin: 4.2 g/dL (ref 3.8–4.8)
Alkaline Phosphatase: 83 IU/L (ref 44–121)
Bilirubin Total: 0.4 mg/dL (ref 0.0–1.2)
Bilirubin, Direct: 0.15 mg/dL (ref 0.00–0.40)
Total Protein: 6.7 g/dL (ref 6.0–8.5)

## 2023-07-16 DEATH — deceased
